# Patient Record
Sex: Female | Born: 1985
Health system: Southern US, Community
[De-identification: ages and names within clinical notes are randomized; demographics above are authoritative.]

## PROBLEM LIST (undated history)

## (undated) DIAGNOSIS — M1990 Unspecified osteoarthritis, unspecified site: Secondary | ICD-10-CM

## (undated) DIAGNOSIS — E119 Type 2 diabetes mellitus without complications: Secondary | ICD-10-CM

## (undated) DIAGNOSIS — R51 Headache: Secondary | ICD-10-CM

## (undated) DIAGNOSIS — I10 Essential (primary) hypertension: Secondary | ICD-10-CM

## (undated) DIAGNOSIS — L732 Hidradenitis suppurativa: Secondary | ICD-10-CM

## (undated) DIAGNOSIS — E039 Hypothyroidism, unspecified: Secondary | ICD-10-CM

## (undated) DIAGNOSIS — R011 Cardiac murmur, unspecified: Secondary | ICD-10-CM

## (undated) DIAGNOSIS — R519 Headache, unspecified: Secondary | ICD-10-CM

## (undated) DIAGNOSIS — J189 Pneumonia, unspecified organism: Secondary | ICD-10-CM

## (undated) DIAGNOSIS — E669 Obesity, unspecified: Secondary | ICD-10-CM

## (undated) DIAGNOSIS — D509 Iron deficiency anemia, unspecified: Secondary | ICD-10-CM

## (undated) DIAGNOSIS — K219 Gastro-esophageal reflux disease without esophagitis: Secondary | ICD-10-CM

## (undated) DIAGNOSIS — E079 Disorder of thyroid, unspecified: Secondary | ICD-10-CM

## (undated) HISTORY — DX: Hidradenitis suppurativa: L73.2

## (undated) HISTORY — DX: Iron deficiency anemia, unspecified: D50.9

## (undated) HISTORY — DX: Disorder of thyroid, unspecified: E07.9

## (undated) HISTORY — DX: Type 2 diabetes mellitus without complications: E11.9

## (undated) HISTORY — PX: WISDOM TOOTH EXTRACTION: SHX21

---
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
## 2007-10-25 ENCOUNTER — Emergency Department (HOSPITAL_COMMUNITY): Admission: EM | Admit: 2007-10-25 | Discharge: 2007-10-25 | Payer: Self-pay | Admitting: Family Medicine

## 2008-04-16 ENCOUNTER — Ambulatory Visit: Payer: Self-pay | Admitting: Nurse Practitioner

## 2008-04-16 DIAGNOSIS — J309 Allergic rhinitis, unspecified: Secondary | ICD-10-CM | POA: Insufficient documentation

## 2008-04-16 DIAGNOSIS — I10 Essential (primary) hypertension: Secondary | ICD-10-CM | POA: Insufficient documentation

## 2008-04-19 ENCOUNTER — Ambulatory Visit: Payer: Self-pay | Admitting: *Deleted

## 2008-05-14 ENCOUNTER — Ambulatory Visit: Payer: Self-pay | Admitting: Nurse Practitioner

## 2008-05-17 ENCOUNTER — Telehealth (INDEPENDENT_AMBULATORY_CARE_PROVIDER_SITE_OTHER): Payer: Self-pay | Admitting: Nurse Practitioner

## 2008-07-07 ENCOUNTER — Ambulatory Visit: Payer: Self-pay | Admitting: Nurse Practitioner

## 2008-07-07 DIAGNOSIS — R3129 Other microscopic hematuria: Secondary | ICD-10-CM | POA: Insufficient documentation

## 2008-07-07 DIAGNOSIS — B009 Herpesviral infection, unspecified: Secondary | ICD-10-CM | POA: Insufficient documentation

## 2008-07-07 DIAGNOSIS — N760 Acute vaginitis: Secondary | ICD-10-CM | POA: Insufficient documentation

## 2008-07-07 LAB — CONVERTED CEMR LAB
Bilirubin Urine: NEGATIVE
Chlamydia, Swab/Urine, PCR: NEGATIVE
Urobilinogen, UA: 0.2
pH: 6

## 2008-07-19 ENCOUNTER — Telehealth (INDEPENDENT_AMBULATORY_CARE_PROVIDER_SITE_OTHER): Payer: Self-pay | Admitting: *Deleted

## 2008-12-07 ENCOUNTER — Encounter (INDEPENDENT_AMBULATORY_CARE_PROVIDER_SITE_OTHER): Payer: Self-pay | Admitting: Nurse Practitioner

## 2009-05-06 ENCOUNTER — Emergency Department (HOSPITAL_COMMUNITY): Admission: EM | Admit: 2009-05-06 | Discharge: 2009-05-06 | Payer: Self-pay | Admitting: Family Medicine

## 2010-03-30 ENCOUNTER — Emergency Department (HOSPITAL_COMMUNITY): Admission: EM | Admit: 2010-03-30 | Discharge: 2010-03-31 | Payer: Self-pay | Admitting: Emergency Medicine

## 2010-09-03 LAB — CONVERTED CEMR LAB
ALT: 22 units/L (ref 0–35)
AST: 16 units/L (ref 0–37)
Alkaline Phosphatase: 57 units/L (ref 39–117)
Basophils Absolute: 0.1 10*3/uL (ref 0.0–0.1)
Basophils Relative: 0 % (ref 0–1)
Bilirubin Urine: NEGATIVE
Blood in Urine, dipstick: NEGATIVE
Eosinophils Absolute: 0.3 10*3/uL (ref 0.0–0.7)
Eosinophils Relative: 2 % (ref 0–5)
Glucose, Urine, Semiquant: NEGATIVE
HCT: 42.5 % (ref 36.0–46.0)
Ketones, urine, test strip: NEGATIVE
LDL Cholesterol: 67 mg/dL (ref 0–99)
Lymphocytes Relative: 30 % (ref 12–46)
Nitrite: NEGATIVE
Pap Smear: NORMAL
Platelets: 407 10*3/uL — ABNORMAL HIGH (ref 150–400)
Protein, U semiquant: NEGATIVE
RDW: 15 % (ref 11.5–15.5)
Sodium: 138 meq/L (ref 135–145)
Specific Gravity, Urine: 1.025
Total Bilirubin: 0.6 mg/dL (ref 0.3–1.2)
Total Protein: 7.8 g/dL (ref 6.0–8.3)
Urobilinogen, UA: 1
VLDL: 19 mg/dL (ref 0–40)
WBC Urine, dipstick: NEGATIVE
pH: 5.5

## 2010-10-20 LAB — URINALYSIS, ROUTINE W REFLEX MICROSCOPIC
Bilirubin Urine: NEGATIVE
Glucose, UA: NEGATIVE mg/dL
Hgb urine dipstick: NEGATIVE
Protein, ur: NEGATIVE mg/dL
Specific Gravity, Urine: 1.018 (ref 1.005–1.030)
Urobilinogen, UA: 1 mg/dL (ref 0.0–1.0)

## 2010-10-20 LAB — BASIC METABOLIC PANEL
CO2: 30 mEq/L (ref 19–32)
Calcium: 9.4 mg/dL (ref 8.4–10.5)
Chloride: 98 mEq/L (ref 96–112)
Glucose, Bld: 122 mg/dL — ABNORMAL HIGH (ref 70–99)
Sodium: 135 mEq/L (ref 135–145)

## 2010-10-20 LAB — DIFFERENTIAL
Eosinophils Relative: 1 % (ref 0–5)
Lymphocytes Relative: 18 % (ref 12–46)
Lymphs Abs: 3.1 10*3/uL (ref 0.7–4.0)

## 2010-10-20 LAB — CBC
Hemoglobin: 13 g/dL (ref 12.0–15.0)
MCH: 24.8 pg — ABNORMAL LOW (ref 26.0–34.0)
MCHC: 31.9 g/dL (ref 30.0–36.0)
MCV: 77.7 fL — ABNORMAL LOW (ref 78.0–100.0)

## 2010-10-20 LAB — D-DIMER, QUANTITATIVE: D-Dimer, Quant: 0.75 ug/mL-FEU — ABNORMAL HIGH (ref 0.00–0.48)

## 2010-10-20 LAB — POCT PREGNANCY, URINE: Preg Test, Ur: NEGATIVE

## 2010-12-05 ENCOUNTER — Inpatient Hospital Stay (INDEPENDENT_AMBULATORY_CARE_PROVIDER_SITE_OTHER)
Admission: RE | Admit: 2010-12-05 | Discharge: 2010-12-05 | Disposition: A | Discharge status: Home / Self Care | Payer: PRIVATE HEALTH INSURANCE | Source: Ambulatory Visit | Attending: Family Medicine | Admitting: Family Medicine

## 2010-12-05 DIAGNOSIS — I10 Essential (primary) hypertension: Secondary | ICD-10-CM

## 2010-12-05 DIAGNOSIS — R51 Headache: Secondary | ICD-10-CM

## 2011-08-09 ENCOUNTER — Emergency Department (HOSPITAL_COMMUNITY): Payer: Self-pay

## 2011-08-09 ENCOUNTER — Encounter: Payer: Self-pay | Admitting: Emergency Medicine

## 2011-08-09 ENCOUNTER — Emergency Department (HOSPITAL_COMMUNITY)
Admission: EM | Admit: 2011-08-09 | Discharge: 2011-08-09 | Disposition: A | Discharge status: Home / Self Care | Payer: Self-pay | Attending: Emergency Medicine | Admitting: Emergency Medicine

## 2011-08-09 DIAGNOSIS — Z79899 Other long term (current) drug therapy: Secondary | ICD-10-CM | POA: Insufficient documentation

## 2011-08-09 DIAGNOSIS — I10 Essential (primary) hypertension: Secondary | ICD-10-CM | POA: Insufficient documentation

## 2011-08-09 DIAGNOSIS — M25571 Pain in right ankle and joints of right foot: Secondary | ICD-10-CM

## 2011-08-09 DIAGNOSIS — M25579 Pain in unspecified ankle and joints of unspecified foot: Secondary | ICD-10-CM | POA: Insufficient documentation

## 2011-08-09 HISTORY — DX: Essential (primary) hypertension: I10

## 2011-08-09 MED ORDER — HYDROCODONE-ACETAMINOPHEN 5-325 MG PO TABS: 2.0000 | ORAL_TABLET | ORAL | Status: DC | PRN

## 2011-08-09 NOTE — ED Provider Notes (Signed)
History     CSN: [id number]  Arrival date & time 08/09/11  0122   First MD Initiated Contact with Patient 08/09/11 0402      Chief Complaint  Patient presents with  . Ankle Pain    since christmas eve    (Consider location/radiation/quality/duration/timing/severity/associated sxs/prior treatment) HPI Complains of right ankle pain onset approximately Christmas Day, 2012.Marland Kitchen No known injury pain is worse with walking or standing or playing pressure improved with rest treat with ibuprofen without adequate pain relief. No known injury no fever no other joint pain no other complaint no other associated symptoms  Past Medical History  Diagnosis Date  . Hypertension     History reviewed. No pertinent past surgical history.  History reviewed. No pertinent family history.  History  Substance Use Topics  . Smoking status: Not on file  . Smokeless tobacco: Not on file  . Alcohol Use: Yes     occassional    OB History    Grav Para Term Preterm Abortions TAB SAB Ect Mult Living                  Review of Systems  Constitutional: Negative.   HENT: Negative.   Respiratory: Negative.   Cardiovascular: Negative.   Gastrointestinal: Negative.   Musculoskeletal: Positive for arthralgias.       Pain at right ankle  Skin: Negative.   Neurological: Negative.   Hematological: Negative.   Psychiatric/Behavioral: Negative.   All other systems reviewed and are negative.    Allergies  Review of patient's allergies indicates no known allergies.  Home Medications   Current Outpatient Rx  Name Route Sig Dispense Refill  . ATENOLOL-CHLORTHALIDONE 100-25 MG PO TABS Oral Take 1 tablet by mouth daily.      Marland Kitchen LISINOPRIL 20 MG PO TABS Oral Take 20 mg by mouth daily.        BP 143/85  Pulse 103  Temp(Src) 97.5 F (36.4 C) (Oral)  Resp 20  SpO2 99%  LMP 08/06/2011  Physical Exam  Nursing note and vitals reviewed. Constitutional: She appears well-developed and well-nourished.    HENT:  Head: Normocephalic and atraumatic.  Eyes: Conjunctivae are normal. Pupils are equal, round, and reactive to light.  Neck: Neck supple. No tracheal deviation present. No thyromegaly present.  Cardiovascular: Intact distal pulses.   Pulmonary/Chest: Effort normal and breath sounds normal.  Abdominal: She exhibits no distension. There is no tenderness.       Morbidly obese  Musculoskeletal: She exhibits no edema.       Left upper upper extremity right upper extremity left lower extremity without redness swelling or tenderness. Right lower extremity without rednessor swelling Minimally tender at lateral malleolus negative Thompson sign neurovascularly intact  Neurological: She is alert. Coordination normal.  Skin: Skin is warm and dry. No rash noted.  Psychiatric: She has a normal mood and affect.    ED Course  Procedures (including critical care time)  Labs Reviewed - No data to display Dg Ankle 2 Views Right  08/09/2011  *RADIOLOGY REPORT*  Clinical Data: Lateral ankle pain.  No known injury.  RIGHT ANKLE - 2 VIEW  Comparison: 10/25/2007  Findings: Mild soft tissue swelling.  No evidence of acute fracture or subluxation.  No focal bone lesion or bone destruction.  No abnormal periosteal reaction.  Bone cortex and trabecular architecture appear intact.  No radiopaque foreign bodies in the soft tissues.  No significant change since previous study.  IMPRESSION: No acute bony abnormalities identified.  Original Report Authenticated By: Marlon Pel, M.D.     No diagnosis found.  Results for orders placed during the hospital encounter of 03/30/10  URINALYSIS, ROUTINE W REFLEX MICROSCOPIC      Component Value Range   Color, Urine YELLOW  YELLOW    APPearance CLEAR  CLEAR    Specific Gravity, Urine 1.018  1.005 - 1.030    pH 5.5  5.0 - 8.0    Glucose, UA NEGATIVE  NEGATIVE (mg/dL)   Hgb urine dipstick NEGATIVE  NEGATIVE    Bilirubin Urine NEGATIVE  NEGATIVE    Ketones, ur  NEGATIVE  NEGATIVE (mg/dL)   Protein, ur NEGATIVE  NEGATIVE (mg/dL)   Urobilinogen, UA 1.0  0.0 - 1.0 (mg/dL)   Nitrite NEGATIVE  NEGATIVE    Leukocytes, UA    NEGATIVE    Value: NEGATIVE MICROSCOPIC NOT DONE ON URINES WITH NEGATIVE PROTEIN, BLOOD, LEUKOCYTES, NITRITE, OR GLUCOSE <1000 mg/dL.  BASIC METABOLIC PANEL      Component Value Range   Sodium 135  135 - 145 (mEq/L)   Potassium 3.5  3.5 - 5.1 (mEq/L)   Chloride 98  96 - 112 (mEq/L)   CO2 30  19 - 32 (mEq/L)   Glucose, Bld 122 (*) 70 - 99 (mg/dL)   BUN 4 (*) 6 - 23 (mg/dL)   Creatinine, Ser 9.60  0.4 - 1.2 (mg/dL)   Calcium 9.4  8.4 - 45.4 (mg/dL)   GFR calc non Af Amer >60  >60 (mL/min)   GFR calc Af Amer    >60 (mL/min)   Value: >60            The eGFR has been calculated     using the MDRD equation.     This calculation has not been     validated in all clinical     situations.     eGFR's persistently     <60 mL/min signify     possible Chronic Kidney Disease.  CBC      Component Value Range   WBC 17.4 (*) 4.0 - 10.5 (K/uL)   RBC 5.25 (*) 3.87 - 5.11 (MIL/uL)   Hemoglobin 13.0  12.0 - 15.0 (g/dL)   HCT 09.8  11.9 - 14.7 (%)   MCV 77.7 (*) 78.0 - 100.0 (fL)   MCH 24.8 (*) 26.0 - 34.0 (pg)   MCHC 31.9  30.0 - 36.0 (g/dL)   RDW 82.9  56.2 - 13.0 (%)   Platelets 413 (*) 150 - 400 (K/uL)  DIFFERENTIAL      Component Value Range   Neutrophils Relative 76  43 - 77 (%)   Neutro Abs 13.2 (*) 1.7 - 7.7 (K/uL)   Lymphocytes Relative 18  12 - 46 (%)   Lymphs Abs 3.1  0.7 - 4.0 (K/uL)   Monocytes Relative 5  3 - 12 (%)   Monocytes Absolute 0.9  0.1 - 1.0 (K/uL)   Eosinophils Relative 1  0 - 5 (%)   Eosinophils Absolute 0.2  0.0 - 0.7 (K/uL)   Basophils Relative 0  0 - 1 (%)   Basophils Absolute 0.0  0.0 - 0.1 (K/uL)  D-DIMER, QUANTITATIVE      Component Value Range   D-Dimer, Quant   (*) 0.00 - 0.48 (ug/mL-FEU)   Value: 0.75            AT THE INHOUSE ESTABLISHED CUTOFF     VALUE OF 0.48 ug/mL FEU,     THIS ASSAY  HAS BEEN DOCUMENTED     IN THE LITERATURE TO HAVE     A SENSITIVITY AND NEGATIVE     PREDICTIVE VALUE OF AT LEAST     98 TO 99%.  THE TEST RESULT     SHOULD BE CORRELATED WITH     AN ASSESSMENT OF THE CLINICAL     PROBABILITY OF DVT / VTE.  POCT PREGNANCY, URINE      Component Value Range   Preg Test, Ur       Value: NEGATIVE            THE SENSITIVITY OF THIS     METHODOLOGY IS >24 mIU/mL   Dg Ankle 2 Views Right  08/09/2011  *RADIOLOGY REPORT*  Clinical Data: Lateral ankle pain.  No known injury.  RIGHT ANKLE - 2 VIEW  Comparison: 10/25/2007  Findings: Mild soft tissue swelling.  No evidence of acute fracture or subluxation.  No focal bone lesion or bone destruction.  No abnormal periosteal reaction.  Bone cortex and trabecular architecture appear intact.  No radiopaque foreign bodies in the soft tissues.  No significant change since previous study.  IMPRESSION: No acute bony abnormalities identified.  Original Report Authenticated By: Marlon Pel, M.D.     MDM  No signs of infection or inflammation no signs of injury no history of injury Plan ASO splint crutches prescription hydrocodone-A. Pap orthopedic referral as needed Diagnosis pain in right ankle        Doug Sou, MD 08/09/11 [phone]

## 2011-08-09 NOTE — ED Notes (Signed)
Pt choose to ambulate out to pmv. Rated pain 8 . Gait steady with splint and ortho shoe on with use of wall and rails to steady self as pt refused crutches.

## 2011-08-13 ENCOUNTER — Emergency Department (HOSPITAL_COMMUNITY)
Admission: EM | Admit: 2011-08-13 | Discharge: 2011-08-14 | Disposition: A | Discharge status: Home / Self Care | Payer: Self-pay | Attending: Emergency Medicine | Admitting: Emergency Medicine

## 2011-08-13 ENCOUNTER — Encounter (HOSPITAL_COMMUNITY): Payer: Self-pay | Admitting: Emergency Medicine

## 2011-08-13 ENCOUNTER — Emergency Department (HOSPITAL_COMMUNITY): Payer: Self-pay

## 2011-08-13 DIAGNOSIS — M25473 Effusion, unspecified ankle: Secondary | ICD-10-CM | POA: Insufficient documentation

## 2011-08-13 DIAGNOSIS — Z79899 Other long term (current) drug therapy: Secondary | ICD-10-CM | POA: Insufficient documentation

## 2011-08-13 DIAGNOSIS — S93409A Sprain of unspecified ligament of unspecified ankle, initial encounter: Secondary | ICD-10-CM | POA: Insufficient documentation

## 2011-08-13 DIAGNOSIS — M25579 Pain in unspecified ankle and joints of unspecified foot: Secondary | ICD-10-CM | POA: Insufficient documentation

## 2011-08-13 DIAGNOSIS — S93401A Sprain of unspecified ligament of right ankle, initial encounter: Secondary | ICD-10-CM

## 2011-08-13 DIAGNOSIS — T733XXA Exhaustion due to excessive exertion, initial encounter: Secondary | ICD-10-CM | POA: Insufficient documentation

## 2011-08-13 DIAGNOSIS — M25476 Effusion, unspecified foot: Secondary | ICD-10-CM | POA: Insufficient documentation

## 2011-08-13 DIAGNOSIS — I10 Essential (primary) hypertension: Secondary | ICD-10-CM | POA: Insufficient documentation

## 2011-08-13 MED ORDER — KETOROLAC TROMETHAMINE 60 MG/2ML IM SOLN
60.0000 mg | Freq: Once | INTRAMUSCULAR | Status: AC
  Administered 2011-08-13: 60 mg via INTRAMUSCULAR
  Filled 2011-08-13: qty 2

## 2011-08-13 NOTE — ED Provider Notes (Signed)
History     CSN: [id number]  Arrival date & time 08/13/11  2115   First MD Initiated Contact with Patient 08/13/11 2256      Chief Complaint  Patient presents with  . Ankle Pain    R ankle seen 4 days ago [facility] for same    (Consider location/radiation/quality/duration/timing/severity/associated sxs/prior treatment) HPI Comments: 26 year old female with history of right ankle injury approximately 5 years ago, felt recurrent pain last week while standing on the foot for 5 hours. Pain has been constant, mild, 4-5 in intensity, associated with mild swelling. Pain is located over the lateral malleolus and Achilles tendon. Pain is worsened by ambulation. No redness or fevers noted she was seen in the emergency department and treated with hydrocodone and ASO ankle splint with no relief.  Patient is a 26 y.o. female presenting with ankle pain. The history is provided by the patient, a relative and medical records.  Ankle Pain  Pertinent negatives include no numbness.    Past Medical History  Diagnosis Date  . Hypertension     History reviewed. No pertinent past surgical history.  History reviewed. No pertinent family history.  History  Substance Use Topics  . Smoking status: Never Smoker   . Smokeless tobacco: Not on file  . Alcohol Use: Yes     occassional    OB History    Grav Para Term Preterm Abortions TAB SAB Ect Mult Living                  Review of Systems  Gastrointestinal: Negative for nausea and vomiting.  Musculoskeletal: Positive for joint swelling.  Neurological: Negative for weakness and numbness.    Allergies  Review of patient's allergies indicates no known allergies.  Home Medications   Current Outpatient Rx  Name Route Sig Dispense Refill  . ATENOLOL-CHLORTHALIDONE 100-25 MG PO TABS Oral Take 1 tablet by mouth daily.      Marland Kitchen HYDROCODONE-ACETAMINOPHEN 5-325 MG PO TABS Oral Take 2 tablets by mouth every 4 (four) hours as needed. For pain     .  LISINOPRIL 20 MG PO TABS Oral Take 20 mg by mouth daily.      Marland Kitchen NAPROXEN 500 MG PO TABS Oral Take 1 tablet (500 mg total) by mouth 2 (two) times daily with a meal. 30 tablet 0    BP 140/79  Pulse 83  Temp(Src) 98.1 F (36.7 C) (Oral)  Resp 20  SpO2 99%  LMP 08/06/2011  Physical Exam  Nursing note and vitals reviewed. Constitutional: She appears well-developed and well-nourished. No distress.  HENT:  Head: Normocephalic and atraumatic.  Eyes: Conjunctivae are normal. No scleral icterus.  Cardiovascular: Normal rate, regular rhythm and intact distal pulses.   Pulmonary/Chest: Effort normal and breath sounds normal.  Musculoskeletal: She exhibits tenderness ( Mild tenderness to palpation over the lateral malleolus, tendons, Achilles tendon. No medial tenderness, no tenderness over the base of the fifth metatarsal.). She exhibits no edema.       Good range of motion of ankle without redness or obvious swelling. Patient is morbidly obese, has supple joints including ankle though notes 5-6/10 pain with range of motion  Neurological: She is alert.  Skin: Skin is warm and dry. No rash noted. She is not diaphoretic.    ED Course  Procedures (including critical care time)  Labs Reviewed - No data to display Dg Ankle Complete Right  08/14/2011  *RADIOLOGY REPORT*  Clinical Data: Foot pain.  Lateral ankle pain and swelling.  RIGHT ANKLE - COMPLETE 3+ VIEW  Comparison: None.  Findings: Ankle mortise appears congruent.  The talar dome is intact.  Lateral malleolar soft tissue swelling.  No ankle fracture is identified.  Anatomic alignment.  Ankle effusion is evident on the lateral view.  IMPRESSION: No acute osseous abnormality.  Lateral malleolar soft tissue swelling.  Original Report Authenticated By: Andreas Newport, M.D.     1. Sprain of right ankle       MDM  Ankle injury, repeat x-ray to rule out occult fracture, NSAID, Toradol intramuscular, chem walker, ice and elevation.  X-ray is  negative, no fracture seen by myself for radiologist.  Discharge prescription,  Naprosyn 500 mg by mouth twice a day        Vida Roller, MD 08/14/11 0045

## 2011-08-13 NOTE — ED Notes (Signed)
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 Seen at [facility] ED several days ago and states ankle splint causes increased pain with ambulation. Norco 1 tab ineffective for pain control

## 2011-08-13 NOTE — ED Notes (Signed)
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 After that she said the she could not bear weight. She came to the ED and they gave her brace and pain medication. According to the paiten, there was no sprain or break. She stated that it has not be getting better since then. She has had increased swelling and pain. She stated that the MD recommended going to an orthopedic, but she did not have any information. There is currently no swelling. Pain is at 0 when she is sitting. When she stands, it throbs and pain is 9 out of 10.

## 2011-08-14 MED ORDER — NAPROXEN 500 MG PO TABS: 500.0000 mg | ORAL_TABLET | Freq: Two times a day (BID) | ORAL | Status: DC

## 2011-11-27 ENCOUNTER — Encounter (HOSPITAL_COMMUNITY): Payer: Self-pay | Admitting: *Deleted

## 2011-11-27 ENCOUNTER — Emergency Department (INDEPENDENT_AMBULATORY_CARE_PROVIDER_SITE_OTHER)
Admission: EM | Admit: 2011-11-27 | Discharge: 2011-11-27 | Disposition: A | Discharge status: Home / Self Care | Payer: Self-pay | Source: Home / Self Care | Attending: Family Medicine | Admitting: Family Medicine

## 2011-11-27 DIAGNOSIS — K529 Noninfective gastroenteritis and colitis, unspecified: Secondary | ICD-10-CM

## 2011-11-27 DIAGNOSIS — K5289 Other specified noninfective gastroenteritis and colitis: Secondary | ICD-10-CM

## 2011-11-27 HISTORY — DX: Unspecified osteoarthritis, unspecified site: M19.90

## 2011-11-27 MED ORDER — IBUPROFEN 600 MG PO TABS: 600.0000 mg | ORAL_TABLET | Freq: Three times a day (TID) | ORAL | Status: DC

## 2011-11-27 MED ORDER — HYDROCODONE-ACETAMINOPHEN 5-500 MG PO TABS: 1.0000 | ORAL_TABLET | Freq: Four times a day (QID) | ORAL | Status: AC | PRN

## 2011-11-27 MED ORDER — LOPERAMIDE HCL 2 MG PO CAPS: 2.0000 mg | ORAL_CAPSULE | Freq: Four times a day (QID) | ORAL | Status: DC | PRN

## 2011-11-27 MED ORDER — LOPERAMIDE HCL 2 MG PO CAPS: 2.0000 mg | ORAL_CAPSULE | Freq: Four times a day (QID) | ORAL | Status: AC | PRN

## 2011-11-27 MED ORDER — ONDANSETRON 4 MG PO TBDP: 4.0000 mg | ORAL_TABLET | Freq: Three times a day (TID) | ORAL | Status: AC | PRN

## 2011-11-27 MED ORDER — IBUPROFEN 600 MG PO TABS: 600.0000 mg | ORAL_TABLET | Freq: Three times a day (TID) | ORAL | Status: AC

## 2011-11-27 MED ORDER — ONDANSETRON 4 MG PO TBDP: 4.0000 mg | ORAL_TABLET | Freq: Three times a day (TID) | ORAL | Status: DC | PRN

## 2011-11-27 NOTE — Discharge Instructions (Signed)
Is likely you have a viral infection. Is very important top keep well hydrated. Take the prescribed medications as instructed. Can take ibuprofen scheduled every 8 hours for the next 24-48 hours take with food and plenty of liquids as it can upset your stomach, can alternate with prescribed Vicodin for fever or pain. Be. aware that Vicodin can make you drowsy and he should not drive or operate machinery after taking this medication. Use nasal saline spray at least 3 times a day. (simply saline is over the counter) Return if difficulty breathing, chest pain worsening symptoms or not keeping fluids down.

## 2011-11-27 NOTE — ED Notes (Signed)
C/o vomiting, diarrhea, fever. States works on [address] at [employer] and was exposed to pneumonia pt last week and states sore throat, congestion after exposure. Then yesterday ate at [employer] and started V/D at 0300 this a.m. With fever States has only taken ginger ale and OJ for symptoms.

## 2011-11-30 NOTE — ED Provider Notes (Signed)
History     CSN: [id number]  Arrival date & time 11/27/11  1643   First MD Initiated Contact with Patient 11/27/11 1734      Chief Complaint  Patient presents with  . Emesis    (Consider location/radiation/quality/duration/timing/severity/associated sxs/prior treatment) HPI Comments: 26 y/o female h/o htn. Here c/o Nasal congestion and sore throat with no fever for more than 3 days this morning her symptoms also associated with nausea, vomiting, diarrhea and fever.  Able to keep fluids but has not been able to eat solids since chinese food last night for dinner. Also concerned as she works as Psychologist, sport and exercise in the hospital and has been exposed to a patient with pneumonia. Denies chest pain, productive cough or difficulty breathing. No medications for her symptoms today. Temp is 100.6   Past Medical History  Diagnosis Date  . Hypertension   . Arthritis     History reviewed. No pertinent past surgical history.  Family History  Problem Relation Age of Onset  . Stroke Other   . Heart failure Other     History  Substance Use Topics  . Smoking status: Never Smoker   . Smokeless tobacco: Not on file  . Alcohol Use: Yes     occassional    OB History    Grav Para Term Preterm Abortions TAB SAB Ect Mult Living                  Review of Systems  Constitutional: Positive for appetite change. Negative for chills and diaphoresis.  HENT: Positive for congestion, sore throat, rhinorrhea and sneezing. Negative for trouble swallowing, neck pain and neck stiffness.   Eyes: Negative for discharge.  Respiratory: Positive for cough. Negative for chest tightness, shortness of breath and wheezing.   Cardiovascular: Negative for chest pain and leg swelling.  Gastrointestinal: Positive for nausea, vomiting and diarrhea. Negative for abdominal pain.  Skin: Negative for rash.    Allergies  Review of patient's allergies indicates no known allergies.  Home Medications   Current Outpatient  Rx  Name Route Sig Dispense Refill  . ATENOLOL-CHLORTHALIDONE 100-25 MG PO TABS Oral Take 1 tablet by mouth daily.      Marland Kitchen HYDROCODONE-ACETAMINOPHEN 5-500 MG PO TABS Oral Take 1 tablet by mouth every 6 (six) hours as needed for pain. 30 tablet 0  . IBUPROFEN 600 MG PO TABS Oral Take 1 tablet (600 mg total) by mouth 3 (three) times daily. 20 tablet 0  . LISINOPRIL 20 MG PO TABS Oral Take 20 mg by mouth daily.      Marland Kitchen LOPERAMIDE HCL 2 MG PO CAPS Oral Take 1 capsule (2 mg total) by mouth 4 (four) times daily as needed for diarrhea or loose stools. 12 capsule 0  . ONDANSETRON 4 MG PO TBDP Oral Take 1 tablet (4 mg total) by mouth every 8 (eight) hours as needed for nausea. 10 tablet 0    BP 158/95  Pulse 110  Temp(Src) 100.6 F (38.1 C) (Oral)  Resp 22  SpO2 100%  LMP 11/13/2011  Physical Exam  Nursing note and vitals reviewed. Constitutional: She is oriented to person, place, and time. She appears well-developed and well-nourished. No distress.       obese  HENT:       Nasal Congestion with erythema and swelling of nasal turbinates, clear rhinorrhea. mild pharyngeal erythema no exudates. No uvula deviation. No trismus.  TM's normal.   Eyes: Conjunctivae are normal. Pupils are equal, round, and reactive to  light. No scleral icterus.  Neck: Neck supple. No JVD present. No thyromegaly present.  Cardiovascular: Normal rate, regular rhythm and normal heart sounds.   Pulmonary/Chest: Effort normal and breath sounds normal. No respiratory distress. She has no wheezes. She has no rales.  Abdominal: Soft. Bowel sounds are normal. She exhibits no distension and no mass. There is no tenderness. There is no rebound and no guarding.  Lymphadenopathy:    She has no cervical adenopathy.  Neurological: She is alert and oriented to person, place, and time.  Skin: No rash noted.    ED Course  Procedures (including critical care time)   Labs Reviewed  POCT RAPID STREP A ([facility] ONLY)  LAB  REPORT - SCANNED   No results found.   1. Gastroenteritis       MDM  Negative strept. Impress viral gastroenteritis. Treated symptomatically. Red flags that should prompt her return discussed.         Sharin Grave, MD 11/30/11 2123

## 2013-03-02 ENCOUNTER — Emergency Department (INDEPENDENT_AMBULATORY_CARE_PROVIDER_SITE_OTHER): Payer: 59

## 2013-03-02 ENCOUNTER — Encounter (HOSPITAL_COMMUNITY): Payer: Self-pay | Admitting: *Deleted

## 2013-03-02 ENCOUNTER — Emergency Department (HOSPITAL_COMMUNITY)
Admission: EM | Admit: 2013-03-02 | Discharge: 2013-03-02 | Disposition: A | Discharge status: Home / Self Care | Payer: 59 | Source: Home / Self Care

## 2013-03-02 DIAGNOSIS — E669 Obesity, unspecified: Secondary | ICD-10-CM

## 2013-03-02 DIAGNOSIS — M25562 Pain in left knee: Secondary | ICD-10-CM

## 2013-03-02 DIAGNOSIS — M25569 Pain in unspecified knee: Secondary | ICD-10-CM

## 2013-03-02 MED ORDER — MELOXICAM 15 MG PO TABS: 15.0000 mg | ORAL_TABLET | Freq: Every day | ORAL | Status: DC | PRN

## 2013-03-02 MED ORDER — TRAMADOL HCL 50 MG PO TABS: 50.0000 mg | ORAL_TABLET | Freq: Four times a day (QID) | ORAL | Status: DC | PRN

## 2013-03-02 NOTE — ED Notes (Signed)
Pt reports right knee pain with onset at work of [employer].  Pt states knee "buckled" while walking.

## 2013-03-02 NOTE — ED Provider Notes (Signed)
Lorraine Chambers is a 27 y.o. female who presents to Urgent Care today for left anterior knee pain. Patient was standing at work on Saturday when her knee suddenly gave way. She noted significant anterior knee pain. She denies any twisting or fall. She was standing still in her knee suddenly gave way. She is significant anterior knee pain and difficulty walking and bearing weight. She has tried several over-the-counter pain medications which have helped a bit. She denies any significant prior history. No radiating pain weakness or numbness.    PMH reviewed. Morbidly obese, hypertension History  Substance Use Topics  . Smoking status: Never Smoker   . Smokeless tobacco: Not on file  . Alcohol Use: Yes     Comment: occassional   ROS as above Medications reviewed. No current facility-administered medications for this encounter.   Current Outpatient Prescriptions  Medication Sig Dispense Refill  . atenolol-chlorthalidone (TENORETIC) 100-25 MG per tablet Take 1 tablet by mouth daily.        Marland Kitchen lisinopril (PRINIVIL,ZESTRIL) 20 MG tablet Take 20 mg by mouth daily.       . meloxicam (MOBIC) 15 MG tablet Take 1 tablet (15 mg total) by mouth daily as needed for pain.  30 tablet  0  . traMADol (ULTRAM) 50 MG tablet Take 1 tablet (50 mg total) by mouth every 6 (six) hours as needed for pain.  30 tablet  0    Exam:  BP 140/93  Pulse 98  Temp(Src) 97.8 F (36.6 C) (Oral)  SpO2 94%  LMP 01/04/2013 Gen: Well NAD, morbidly obese HEENT: EOMI,  MMM Lungs: CTABL Nl WOB Heart: RRR no MRG Abd: NABS, NT, ND Exts: Non edematous BL  LE, warm and well perfused.  Left knee: Morbidly obese leg but difficult to ascertain anatomical landmarks. Tender to palpation at the inferior patella pole. Pain with patellar motion.  Range of motion 0-90 Negative McMurray's test.  Capillary refill sensation are intact distally.   No results found for this or any previous visit (from the past 24 hour(s)). Dg Knee 2  Views Left  03/02/2013   *RADIOLOGY REPORT*  Clinical Data: Knee pain  LEFT KNEE - 1-2 VIEW  Comparison: None.  Findings: Suboptimal AP positioning.  Mild tricompartmental degenerative change noted.  No fracture or dislocation.  No suprapatellar effusion.  No radiopaque foreign body.  IMPRESSION: No acute osseous abnormality of the left knee.   Original Report Authenticated By: Christiana Pellant, M.D.    Assessment and Plan: 27 y.o. female with left anterior knee pain. Likely patellofemoral chondromalacia related. Possible meniscus injury. Difficult to ascertain due to body habitus.  Plan: Crutches and meloxicam as needed as well as tramadol.  Followup [facility]ics [facility] medicine [facility] in a few days.  Out of work until followup.   Advised patient to lose weight     Rodolph Bong, MD 03/02/13 [phone]

## 2013-09-09 ENCOUNTER — Emergency Department (HOSPITAL_COMMUNITY)
Admission: EM | Admit: 2013-09-09 | Discharge: 2013-09-09 | Disposition: A | Discharge status: Home / Self Care | Payer: 59 | Source: Home / Self Care

## 2013-09-09 ENCOUNTER — Ambulatory Visit (INDEPENDENT_AMBULATORY_CARE_PROVIDER_SITE_OTHER): Payer: 59 | Admitting: Emergency Medicine

## 2013-09-09 VITALS — BP 116/80 | HR 74 | Temp 97.7°F | Resp 16 | Ht 60.0 in | Wt 345.0 lb

## 2013-09-09 DIAGNOSIS — R300 Dysuria: Secondary | ICD-10-CM

## 2013-09-09 DIAGNOSIS — N3000 Acute cystitis without hematuria: Secondary | ICD-10-CM

## 2013-09-09 LAB — POCT UA - MICROSCOPIC ONLY
BACTERIA, U MICROSCOPIC: NEGATIVE
Casts, Ur, LPF, POC: NEGATIVE
Crystals, Ur, HPF, POC: NEGATIVE
MUCUS UA: NEGATIVE
YEAST UA: NEGATIVE

## 2013-09-09 LAB — POCT URINALYSIS DIPSTICK
BILIRUBIN UA: NEGATIVE
GLUCOSE UA: NEGATIVE
Ketones, UA: NEGATIVE
Nitrite, UA: NEGATIVE
PH UA: 5.5
Protein, UA: 30
RBC UA: NEGATIVE
Urobilinogen, UA: 0.2

## 2013-09-09 MED ORDER — CIPROFLOXACIN HCL 500 MG PO TABS: 500.0000 mg | ORAL_TABLET | Freq: Two times a day (BID) | ORAL | Status: DC

## 2013-09-09 MED ORDER — PHENAZOPYRIDINE HCL 200 MG PO TABS: 200.0000 mg | ORAL_TABLET | Freq: Three times a day (TID) | ORAL | Status: DC | PRN

## 2013-09-09 NOTE — Progress Notes (Signed)
[facility] [address] [place] [address] [phone]  Date:  09/09/2013   Name:  Lorraine Chambers   DOB:  01-May-1986   MRN:  [id number]  PCP:  Provider Not In System    Chief Complaint: Dysuria   History of Present Illness:  Lorraine Chambers is a 28 y.o. very pleasant female patient who presents with the following:  Dysuria, urgency and frequent urination.  Last night voided 20-25 times at work.  No fever or chills, no GYN or GI symptoms.  No abdominal or back pain.  No improvement with over the counter medications or other home remedies. Denies other complaint or health concern today.   Patient Active Problem List   Diagnosis Date Noted  . HERPES SIMPLEX INFECTION, TYPE I 07/07/2008  . MICROSCOPIC HEMATURIA 07/07/2008  . VAGINITIS, BACTERIAL 07/07/2008  . OBESITY 04/16/2008  . HYPERTENSION, BENIGN ESSENTIAL 04/16/2008  . ALLERGIC RHINITIS 04/16/2008    Past Medical History  Diagnosis Date  . Arthritis   . Hypertension     Pt reports episode of HTN in 2009    History reviewed. No pertinent past surgical history.  History  Substance Use Topics  . Smoking status: Never Smoker   . Smokeless tobacco: Not on file  . Alcohol Use: Yes     Comment: occassional    Family History  Problem Relation Age of Onset  . Stroke Other   . Heart failure Other     No Known Allergies  Medication list has been reviewed and updated.  Current Outpatient Prescriptions on File Prior to Visit  Medication Sig Dispense Refill  . atenolol-chlorthalidone (TENORETIC) 100-25 MG per tablet Take 1 tablet by mouth daily.        Marland Kitchen lisinopril (PRINIVIL,ZESTRIL) 20 MG tablet Take 20 mg by mouth daily.       . meloxicam (MOBIC) 15 MG tablet Take 1 tablet (15 mg total) by mouth daily as needed for pain.  30 tablet  0  . traMADol (ULTRAM) 50 MG tablet Take 1 tablet (50 mg total) by mouth every 6 (six) hours as needed for pain.  30 tablet  0   No current  facility-administered medications on file prior to visit.    Review of Systems:  As per HPI, otherwise negative.    Physical Examination: Filed Vitals:   09/09/13 1346  BP: 116/80  Pulse: 74  Temp: 97.7 F (36.5 C)  Resp: 16   Filed Vitals:   09/09/13 1346  Height: 5' (1.524 m)  Weight: 345 lb (156.491 kg)   Body mass index is 67.38 kg/(m^2). Ideal Body Weight: Weight in (lb) to have BMI = 25: 127.7   GEN: WDWN, NAD, Non-toxic, Alert & Oriented x 3 HEENT: Atraumatic, Normocephalic.  Ears and Nose: No external deformity. EXTR: No clubbing/cyanosis/edema NEURO: Normal gait.  PSYCH: Normally interactive. Conversant. Not depressed or anxious appearing.  Calm demeanor.    Assessment and Plan: Urinary frequency Cystitis  Signed,  Ellison Carwin, MD   Results for orders placed in visit on 09/09/13  POCT URINALYSIS DIPSTICK      Result Value Range   Color, UA yellow     Clarity, UA clear     Glucose, UA neg     Bilirubin, UA neg     Ketones, UA neg     Spec Grav, UA >=1.030     Blood, UA neg     pH, UA 5.5     Protein, UA  30     Urobilinogen, UA 0.2     Nitrite, UA neg     Leukocytes, UA Trace    POCT UA - MICROSCOPIC ONLY      Result Value Range   WBC, Ur, HPF, POC 0-2     RBC, urine, microscopic 0-1     Bacteria, U Microscopic neg     Mucus, UA neg     Epithelial cells, urine per micros 0-5     Crystals, Ur, HPF, POC neg     Casts, Ur, LPF, POC neg     Yeast, UA neg

## 2013-09-09 NOTE — Patient Instructions (Signed)
Urinary Tract Infection  Urinary tract infections (UTIs) can develop anywhere along your urinary tract. Your urinary tract is your body's drainage system for removing wastes and extra water. Your urinary tract includes two kidneys, two ureters, a bladder, and a urethra. Your kidneys are a pair of bean-shaped organs. Each kidney is about the size of your fist. They are located below your ribs, one on each side of your spine.  CAUSES  Infections are caused by microbes, which are microscopic organisms, including fungi, viruses, and bacteria. These organisms are so small that they can only be seen through a microscope. Bacteria are the microbes that most commonly cause UTIs.  SYMPTOMS   Symptoms of UTIs may vary by age and gender of the patient and by the location of the infection. Symptoms in young women typically include a frequent and intense urge to urinate and a painful, burning feeling in the bladder or urethra during urination. Older women and men are more likely to be tired, shaky, and weak and have muscle aches and abdominal pain. A fever may mean the infection is in your kidneys. Other symptoms of a kidney infection include pain in your back or sides below the ribs, nausea, and vomiting.  DIAGNOSIS  To diagnose a UTI, your caregiver will ask you about your symptoms. Your caregiver also will ask to provide a urine sample. The urine sample will be tested for bacteria and white blood cells. White blood cells are made by your body to help fight infection.  TREATMENT   Typically, UTIs can be treated with medication. Because most UTIs are caused by a bacterial infection, they usually can be treated with the use of antibiotics. The choice of antibiotic and length of treatment depend on your symptoms and the type of bacteria causing your infection.  HOME CARE INSTRUCTIONS   If you were prescribed antibiotics, take them exactly as your caregiver instructs you. Finish the medication even if you feel better after you  have only taken some of the medication.   Drink enough water and fluids to keep your urine clear or pale yellow.   Avoid caffeine, tea, and carbonated beverages. They tend to irritate your bladder.   Empty your bladder often. Avoid holding urine for long periods of time.   Empty your bladder before and after sexual intercourse.   After a bowel movement, women should cleanse from front to back. Use each tissue only once.  SEEK MEDICAL CARE IF:    You have back pain.   You develop a fever.   Your symptoms do not begin to resolve within 3 days.  SEEK IMMEDIATE MEDICAL CARE IF:    You have severe back pain or lower abdominal pain.   You develop chills.   You have nausea or vomiting.   You have continued burning or discomfort with urination.  MAKE SURE YOU:    Understand these instructions.   Will watch your condition.   Will get help right away if you are not doing well or get worse.  Document Released: 05/02/2005 Document Revised: 01/22/2012 Document Reviewed: 08/31/2011  [employer] Patient Information 2014 [employer].

## 2013-09-11 LAB — URINE CULTURE

## 2013-09-19 ENCOUNTER — Other Ambulatory Visit: Payer: Self-pay | Admitting: Family Medicine

## 2013-09-19 ENCOUNTER — Ambulatory Visit (INDEPENDENT_AMBULATORY_CARE_PROVIDER_SITE_OTHER): Payer: 59 | Admitting: Family Medicine

## 2013-09-19 VITALS — BP 138/92 | HR 90 | Temp 98.1°F | Resp 18 | Ht 60.0 in | Wt 343.0 lb

## 2013-09-19 DIAGNOSIS — N899 Noninflammatory disorder of vagina, unspecified: Secondary | ICD-10-CM

## 2013-09-19 DIAGNOSIS — N898 Other specified noninflammatory disorders of vagina: Secondary | ICD-10-CM

## 2013-09-19 DIAGNOSIS — N760 Acute vaginitis: Secondary | ICD-10-CM

## 2013-09-19 DIAGNOSIS — Z1159 Encounter for screening for other viral diseases: Secondary | ICD-10-CM

## 2013-09-19 DIAGNOSIS — B9689 Other specified bacterial agents as the cause of diseases classified elsewhere: Secondary | ICD-10-CM

## 2013-09-19 DIAGNOSIS — R768 Other specified abnormal immunological findings in serum: Secondary | ICD-10-CM

## 2013-09-19 DIAGNOSIS — Z113 Encounter for screening for infections with a predominantly sexual mode of transmission: Secondary | ICD-10-CM

## 2013-09-19 DIAGNOSIS — R300 Dysuria: Secondary | ICD-10-CM

## 2013-09-19 DIAGNOSIS — Z789 Other specified health status: Secondary | ICD-10-CM

## 2013-09-19 LAB — POCT URINALYSIS DIPSTICK
Bilirubin, UA: NEGATIVE
Blood, UA: NEGATIVE
Glucose, UA: NEGATIVE
Ketones, UA: NEGATIVE
Leukocytes, UA: NEGATIVE
Nitrite, UA: NEGATIVE
Spec Grav, UA: 1.02
Urobilinogen, UA: 2
pH, UA: 8.5

## 2013-09-19 LAB — POCT UA - MICROSCOPIC ONLY
Casts, Ur, LPF, POC: NEGATIVE
Crystals, Ur, HPF, POC: NEGATIVE
Mucus, UA: NEGATIVE
RBC, urine, microscopic: NEGATIVE
WBC, Ur, HPF, POC: NEGATIVE
Yeast, UA: NEGATIVE

## 2013-09-19 LAB — POCT WET PREP WITH KOH
KOH Prep POC: NEGATIVE
Trichomonas, UA: NEGATIVE
WBC WET PREP PER HPF POC: NEGATIVE
Yeast Wet Prep HPF POC: NEGATIVE

## 2013-09-19 LAB — POCT URINE PREGNANCY: PREG TEST UR: NEGATIVE

## 2013-09-19 LAB — HEPATITIS C ANTIBODY: HCV Ab: REACTIVE — AB

## 2013-09-19 LAB — HEPATITIS B SURFACE ANTIBODY, QUANTITATIVE: HEPATITIS B-POST: 669 m[IU]/mL

## 2013-09-19 LAB — HIV ANTIBODY (ROUTINE TESTING W REFLEX): HIV: NONREACTIVE

## 2013-09-19 LAB — HEPATITIS B SURFACE ANTIGEN: HEP B S AG: NEGATIVE

## 2013-09-19 MED ORDER — FLUCONAZOLE 150 MG PO TABS: 150.0000 mg | ORAL_TABLET | Freq: Once | ORAL | Status: DC

## 2013-09-19 MED ORDER — METRONIDAZOLE 500 MG PO TABS: 500.0000 mg | ORAL_TABLET | Freq: Two times a day (BID) | ORAL | Status: DC

## 2013-09-19 NOTE — Progress Notes (Addendum)
[facility] [address] [place] [address] [phone]  Date:  09/19/2013   Name:  Lorraine Chambers   DOB:  July 30, 1986   MRN:  [id number]  PCP:  Provider Not In System    Chief Complaint: Urinary Tract Infection   History of Present Illness:  Lorraine Chambers is a 28 y.o. very pleasant female patient who presents with the following:  Here today to recheck a possible UTI.  She was here on 2/4 with the following HPI:  Dysuria, urgency and frequent urination. Last night voided 20-25 times at work. No fever or chills, no GYN or GI symptoms. No abdominal or back pain. No improvement with over the counter medications or other home remedies. Denies other complaint or health concern today.   She was treated with cipro, but her urine culture came back with insignificant growth.   Since her last visit, her sx did seem to resolve.  However over the last few days she has noted "it's very, very irritated."  She will have some burning and stinging with urination.  Also has noted discharge- it seemed kind of thick and white.  She does have some vaginal itching off and on.   She does still have frequent urination.  No blood in her urine that she has noted.   No fever, chills, back pain, no nausea or vomiting.   She has a paraguard IUD and does get menses some of the time.   She would like to have BW for STI screening today as well  Patient Active Problem List   Diagnosis Date Noted  . HERPES SIMPLEX INFECTION, TYPE I 07/07/2008  . MICROSCOPIC HEMATURIA 07/07/2008  . VAGINITIS, BACTERIAL 07/07/2008  . OBESITY 04/16/2008  . HYPERTENSION, BENIGN ESSENTIAL 04/16/2008  . ALLERGIC RHINITIS 04/16/2008    Past Medical History  Diagnosis Date  . Arthritis   . Hypertension     Pt reports episode of HTN in 2009    History reviewed. No pertinent past surgical history.  History  Substance Use Topics  . Smoking status: Never Smoker   . Smokeless tobacco: Not on file  .  Alcohol Use: Yes     Comment: occassional    Family History  Problem Relation Age of Onset  . Stroke Other   . Heart failure Other   . Heart disease Mother   . Diabetes Mother   . Hypertension Mother     No Known Allergies  Medication list has been reviewed and updated.  Current Outpatient Prescriptions on File Prior to Visit  Medication Sig Dispense Refill  . ciprofloxacin (CIPRO) 500 MG tablet Take 1 tablet (500 mg total) by mouth 2 (two) times daily.  10 tablet  0  . phenazopyridine (PYRIDIUM) 200 MG tablet Take 1 tablet (200 mg total) by mouth 3 (three) times daily as needed for pain.  6 tablet  0   No current facility-administered medications on file prior to visit.    Review of Systems:  As per HPI- otherwise negative.   Physical Examination: Filed Vitals:   09/19/13 0928  BP: 138/92  Pulse: 90  Temp: 98.1 F (36.7 C)  Resp: 18   Filed Vitals:   09/19/13 0928  Height: 5' (1.524 m)  Weight: 343 lb (155.584 kg)   Body mass index is 66.99 kg/(m^2). Ideal Body Weight: Weight in (lb) to have BMI = 25: 127.7  GEN: WDWN, NAD, Non-toxic, A & O x 3, morbid obesity, looks well HEENT:  Atraumatic, Normocephalic. Neck supple. No masses, No LAD. Ears and Nose: No external deformity. CV: RRR, No M/G/R. No JVD. No thrill. No extra heart sounds. PULM: CTA B, no wheezes, crackles, rhonchi. No retractions. No resp. distress. No accessory muscle use. ABD: S, NT, ND. No rebound. No HSM. EXTR: No c/c/e NEURO Normal gait.  PSYCH: Normally interactive. Conversant. Not depressed or anxious appearing.  Calm demeanor.  Gu: IUD stings visible in place. Fishy odor and slight inflammation of the vaginal canal.  Otherwise normal exam- no lesion or ulcer, no CMT, no adnexal masses or tenderness   Results for orders placed in visit on 09/19/13  POCT UA - MICROSCOPIC ONLY      Result Value Ref Range   WBC, Ur, HPF, POC neg     RBC, urine, microscopic neg     Bacteria, U Microscopic  trace     Mucus, UA neg     Epithelial cells, urine per micros 0-2     Crystals, Ur, HPF, POC neg     Casts, Ur, LPF, POC neg     Yeast, UA neg    POCT URINALYSIS DIPSTICK      Result Value Ref Range   Color, UA yellow     Clarity, UA clear     Glucose, UA neg     Bilirubin, UA neg     Ketones, UA neg     Spec Grav, UA 1.020     Blood, UA neg     pH, UA 8.5     Protein, UA trace     Urobilinogen, UA 2.0     Nitrite, UA neg     Leukocytes, UA Negative    POCT URINE PREGNANCY      Result Value Ref Range   Preg Test, Ur Negative    POCT WET PREP WITH KOH      Result Value Ref Range   Trichomonas, UA Negative     Clue Cells Wet Prep HPF POC 0-1     Epithelial Wet Prep HPF POC 0-6     Yeast Wet Prep HPF POC neg     Bacteria Wet Prep HPF POC 2+     RBC Wet Prep HPF POC 0-1     WBC Wet Prep HPF POC neg     KOH Prep POC Negative      Assessment and Plan: Dysuria - Plan: POCT UA - Microscopic Only, POCT urinalysis dipstick  Vaginal irritation - Plan: POCT urine pregnancy, POCT Wet Prep with KOH, GC/Chlamydia Probe Amp, Urine culture, fluconazole (DIFLUCAN) 150 MG tablet  Routine screening for STI (sexually transmitted infection) - Plan: GC/Chlamydia Probe Amp, Hepatitis B surface antibody, Hepatitis B surface antigen, Hepatitis C antibody, HIV antibody, RPR  Bacterial vaginosis - Plan: metroNIDAZOLE (FLAGYL) 500 MG tablet  Treat for BV and possible yeast vaginitis with flagyl and diflucan. Warned not to drink alcohol along with flagyl.  Await other labs and will follow-up with her.  She will let me know if not feeling better soon- Sooner if worse.    Signed Lamar Blinks, MD  Called and spoke with her on 2/18: discussed her positive Hep C screen and negative follow-up test.  She states this has happened to her before- at that time her confirmation test was also negative.  She is immune to hep B.  Will mail a copy of labs for her records.

## 2013-09-20 LAB — URINE CULTURE

## 2013-09-20 LAB — RPR

## 2013-09-21 LAB — GC/CHLAMYDIA PROBE AMP
CT PROBE, AMP APTIMA: NEGATIVE
GC PROBE AMP APTIMA: NEGATIVE

## 2013-09-22 ENCOUNTER — Telehealth: Payer: Self-pay

## 2013-09-22 NOTE — Telephone Encounter (Signed)
No answer. Try again later.

## 2013-09-22 NOTE — Telephone Encounter (Signed)
Advised that not all test are back will await treatment plan from [place]. She understood and will wait for call back.

## 2013-09-22 NOTE — Telephone Encounter (Signed)
PT HAD LABS DONE AND WOULD LIKE TO KNOW IF RESULTS ARE BACK. PLEASE CALL [phone]

## 2013-09-23 LAB — HEPATITIS C VRS RNA DETECT BY PCR-QUAL: HEPATITIS C VRS RNA BY PCR-QUAL: NEGATIVE

## 2013-09-24 ENCOUNTER — Encounter: Payer: Self-pay | Admitting: Family Medicine

## 2013-09-24 DIAGNOSIS — Z789 Other specified health status: Secondary | ICD-10-CM | POA: Insufficient documentation

## 2013-09-24 DIAGNOSIS — R768 Other specified abnormal immunological findings in serum: Secondary | ICD-10-CM | POA: Insufficient documentation

## 2013-10-07 ENCOUNTER — Ambulatory Visit: Payer: 59 | Admitting: Dietician

## 2014-01-18 ENCOUNTER — Telehealth: Payer: Self-pay | Admitting: Hematology and Oncology

## 2014-01-18 NOTE — Telephone Encounter (Signed)
S/W PATIENT AND GAVE NP APPT FOR 07/07 @ 1:30 W/DR. Faith.   [place] PACKET MAILED.

## 2014-01-18 NOTE — Telephone Encounter (Signed)
LEFT MESSAGE FOR PATIENT TO RETURN CALL TO SCHEDULE NP APPT.  °

## 2014-02-08 ENCOUNTER — Ambulatory Visit: Payer: 59 | Admitting: Hematology and Oncology

## 2014-02-08 ENCOUNTER — Ambulatory Visit: Payer: 59

## 2014-02-09 ENCOUNTER — Ambulatory Visit: Payer: 59

## 2014-02-09 ENCOUNTER — Telehealth: Payer: Self-pay | Admitting: Hematology and Oncology

## 2014-02-09 ENCOUNTER — Encounter: Payer: Self-pay | Admitting: Hematology and Oncology

## 2014-02-09 ENCOUNTER — Encounter (INDEPENDENT_AMBULATORY_CARE_PROVIDER_SITE_OTHER): Payer: Self-pay

## 2014-02-09 ENCOUNTER — Ambulatory Visit (HOSPITAL_BASED_OUTPATIENT_CLINIC_OR_DEPARTMENT_OTHER): Payer: 59 | Admitting: Hematology and Oncology

## 2014-02-09 VITALS — BP 145/95 | HR 106 | Temp 98.7°F | Resp 19 | Ht 60.0 in | Wt 345.0 lb

## 2014-02-09 DIAGNOSIS — D72829 Elevated white blood cell count, unspecified: Secondary | ICD-10-CM | POA: Insufficient documentation

## 2014-02-09 DIAGNOSIS — D500 Iron deficiency anemia secondary to blood loss (chronic): Secondary | ICD-10-CM

## 2014-02-09 DIAGNOSIS — R7989 Other specified abnormal findings of blood chemistry: Secondary | ICD-10-CM | POA: Insufficient documentation

## 2014-02-09 DIAGNOSIS — L732 Hidradenitis suppurativa: Secondary | ICD-10-CM

## 2014-02-09 DIAGNOSIS — D473 Essential (hemorrhagic) thrombocythemia: Secondary | ICD-10-CM

## 2014-02-09 DIAGNOSIS — D509 Iron deficiency anemia, unspecified: Secondary | ICD-10-CM | POA: Insufficient documentation

## 2014-02-09 DIAGNOSIS — D75838 Other thrombocytosis: Secondary | ICD-10-CM | POA: Insufficient documentation

## 2014-02-09 HISTORY — DX: Hidradenitis suppurativa: L73.2

## 2014-02-09 HISTORY — DX: Iron deficiency anemia, unspecified: D50.9

## 2014-02-09 MED ORDER — CEPHALEXIN 500 MG PO CAPS: 500.0000 mg | ORAL_CAPSULE | Freq: Two times a day (BID) | ORAL | Status: DC

## 2014-02-09 NOTE — Assessment & Plan Note (Signed)
This is likely related to infection and iron deficiency. Recommend observation only.

## 2014-02-09 NOTE — Assessment & Plan Note (Signed)
This is due to menorrhagia. I recommend a trial of oral iron supplement. If the anemia does not improve in 3 months, she needs to contact her primary care provider for other forms of birth control that may control menorrhagia at the same time.

## 2014-02-09 NOTE — Progress Notes (Signed)
Checked in new pt with no financial concerns. °

## 2014-02-09 NOTE — Telephone Encounter (Signed)
Gave pt appt for lab and MD for October

## 2014-02-09 NOTE — Assessment & Plan Note (Signed)
She has infection underneath the breast and under her armpit. I will prescribe a course of Keflex to see if this would help.

## 2014-02-09 NOTE — Progress Notes (Signed)
[place] CONSULT NOTE  Patient Care Team: Provider Not In System as PCP - General  CHIEF COMPLAINTS/PURPOSE OF CONSULTATION:  Leukocytosis, microcytic anemia and thrombocytosis  HISTORY OF PRESENTING ILLNESS:  Lorraine Chambers 28 y.o. female is here because of abnormal CBC.Marland Kitchen  She was found to have abnormal CBC from routine blood work. She had chronic leukocytosis for many years. The highest white blood cell count was 17.4. Her most recent hemoglobin was 11.8 with microcytosis and platelet count was at elevated at 407. She has chronic skin infection under her armpit and underneath her breast. The last prescription antibiotics was more than 3 months ago There is not reported symptoms of sinus congestion, cough, urinary frequency/urgency or dysuria, diarrhea or joint swelling/pain   She had no prior history or diagnosis of cancer. Her age appropriate screening programs are up-to-date. The patient has no prior diagnosis of autoimmune disease and was not prescribed corticosteroids related products. She also complained of excessive menorrhagia. Currently, she has IUD placed for both control purpose. The patient denies any recent signs or symptoms of bleeding such as spontaneous epistaxis, hematuria or hematochezia.   MEDICAL HISTORY:  Past Medical History  Diagnosis Date  . Arthritis   . Hypertension     Pt reports episode of HTN in 2009  . Thyroid disease   . Hydradenitis 02/09/2014  . Iron deficiency anemia, unspecified 02/09/2014    SURGICAL HISTORY: Past Surgical History  Procedure Laterality Date  . Wisdom tooth extraction      SOCIAL HISTORY: History   Social History  . Marital Status: Single    Spouse Name: N/A    Number of Children: N/A  . Years of Education: N/A   Occupational History  . Not on file.   Social History Main Topics  . Smoking status: Never Smoker   . Smokeless tobacco: Never Used  . Alcohol Use: Yes     Comment: occassional  . Drug  Use: No  . Sexual Activity: Yes    Birth Control/ Protection: IUD   Other Topics Concern  . Not on file   Social History Narrative  . No narrative on file    FAMILY HISTORY: Family History  Problem Relation Age of Onset  . Stroke Other   . Heart failure Other   . Heart disease Mother   . Diabetes Mother   . Hypertension Mother   . Cancer Maternal Uncle     brain ca    ALLERGIES:  has No Known Allergies.  MEDICATIONS:  Current Outpatient Prescriptions  Medication Sig Dispense Refill  . Levonorgestrel (SKYLA) 13.5 MG IUD by Intrauterine route.      Marland Kitchen LEVOTHYROXINE SODIUM PO Take by mouth daily.      . metFORMIN (GLUCOPHAGE) 500 MG tablet Take by mouth 2 (two) times daily with a meal.      . VITAMIN D, ERGOCALCIFEROL, PO Take by mouth once a week.      . cephALEXin (KEFLEX) 500 MG capsule Take 1 capsule (500 mg total) by mouth 2 (two) times daily.  14 capsule  0   No current facility-administered medications for this visit.    REVIEW OF SYSTEMS:   Constitutional: Denies fevers, chills or abnormal night sweats Eyes: Denies blurriness of vision, double vision or watery eyes Ears, nose, mouth, throat, and face: Denies mucositis or sore throat Respiratory: Denies cough, dyspnea or wheezes Cardiovascular: Denies palpitation, chest discomfort or lower extremity swelling Gastrointestinal:  Denies nausea, heartburn or change in bowel  habits Skin: Denies abnormal skin rashes Lymphatics: Denies new lymphadenopathy or easy bruising Neurological:Denies numbness, tingling or new weaknesses Behavioral/Psych: Mood is stable, no new changes  All other systems were reviewed with the patient and are negative.  PHYSICAL EXAMINATION: ECOG PERFORMANCE STATUS: 0 - Asymptomatic  Filed Vitals:   02/09/14 1344  BP: 145/95  Pulse: 106  Temp: 98.7 F (37.1 C)  Resp: 19   Filed Weights   02/09/14 1344  Weight: 345 lb (156.491 kg)    GENERAL:alert, no distress and comfortable. She  is morbidly obese SKIN: Noted skin infection underneath her breast resembling acne.  EYES: normal, conjunctiva are pink and non-injected, sclera clear OROPHARYNX:no exudate, no erythema and lips, buccal mucosa, and tongue normal  NECK: supple, thyroid normal size, non-tender, without nodularity LYMPH:  no palpable lymphadenopathy in the cervical, axillary or inguinal LUNGS: clear to auscultation and percussion with normal breathing effort HEART: regular rate & rhythm and no murmurs and no lower extremity edema ABDOMEN:abdomen soft, non-tender and normal bowel sounds Musculoskeletal:no cyanosis of digits and no clubbing  PSYCH: alert & oriented x 3 with fluent speech NEURO: no focal motor/sensory deficits  LABORATORY DATA:  I have reviewed the data as listed ASSESSMENT & PLAN Leukocytosis This is chronic in nature, likely due to morbid obesity. She also has chronic hydradenitis. I will prescribe antibiotic therapy for her to recheck in 3 months.  Hydradenitis She has infection underneath the breast and under her armpit. I will prescribe a course of Keflex to see if this would help.  Iron deficiency anemia, unspecified This is due to menorrhagia. I recommend a trial of oral iron supplement. If the anemia does not improve in 3 months, she needs to contact her primary care provider for other forms of birth control that may control menorrhagia at the same time.  Reactive thrombocytosis This is likely related to infection and iron deficiency. Recommend observation only.

## 2014-02-09 NOTE — Assessment & Plan Note (Signed)
This is chronic in nature, likely due to morbid obesity. She also has chronic hydradenitis. I will prescribe antibiotic therapy for her to recheck in 3 months.

## 2014-03-09 ENCOUNTER — Ambulatory Visit (INDEPENDENT_AMBULATORY_CARE_PROVIDER_SITE_OTHER): Payer: 59 | Admitting: Family Medicine

## 2014-03-09 ENCOUNTER — Telehealth: Payer: Self-pay | Admitting: *Deleted

## 2014-03-09 VITALS — BP 130/80 | HR 94 | Temp 98.2°F | Resp 16 | Ht 71.0 in | Wt 349.0 lb

## 2014-03-09 DIAGNOSIS — L02415 Cutaneous abscess of right lower limb: Secondary | ICD-10-CM

## 2014-03-09 DIAGNOSIS — L02419 Cutaneous abscess of limb, unspecified: Secondary | ICD-10-CM

## 2014-03-09 DIAGNOSIS — N62 Hypertrophy of breast: Secondary | ICD-10-CM

## 2014-03-09 DIAGNOSIS — L723 Sebaceous cyst: Secondary | ICD-10-CM

## 2014-03-09 DIAGNOSIS — L03119 Cellulitis of unspecified part of limb: Secondary | ICD-10-CM

## 2014-03-09 DIAGNOSIS — L732 Hidradenitis suppurativa: Secondary | ICD-10-CM

## 2014-03-09 MED ORDER — SULFAMETHOXAZOLE-TMP DS 800-160 MG PO TABS: 1.0000 | ORAL_TABLET | Freq: Two times a day (BID) | ORAL | Status: DC

## 2014-03-09 NOTE — Patient Instructions (Signed)
Take the sulfamethoxazole one twice daily for antibiotic for 10 days.   Return in about 2 weeks for [place] to recheck the places.  Return sooner if at any time things are getting worse.

## 2014-03-09 NOTE — Telephone Encounter (Signed)
Pt left VM states the lump under her arm has "gotten bigger"  States Dr. Alvy Bimler had prescribed a antibiotic for this on her last visit 7/07.   Called pt back and got her VM.Marland Kitchen Left her a message to return nurse's call to get more information.

## 2014-03-09 NOTE — Progress Notes (Signed)
Subjective: 28 year old lady who [facility] with a lesion in her left axilla. They sent her on over to oncology because of the elevated white count and mild anemia. The oncologist felt that this was hidradenitis, placed her on cephalexin. She has had hidradenitis problems sometime in the past.  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 No family history of breast disease. No personal history of breast disease except for getting some infected cyst on the breast  She also complains of a painful similar lesion behind her right thigh. She cannot see the area and cannot reach it very well.  She is obese and plans to one day he had a gastric bypass done. Her large breast give her back pain.  Objective: Young lady with macromastia. No obvious lesions on her breast. She has had a 3-4 cm deep lesion in the left breast.. Both breasts were examined and have old scarring from previous skin infections. No masses could be appreciated but difficult to get a very good exam. She has a very tender small open cyst on her right posterior thigh. No drainage could be expressed but the hole was cultured. No cervical nodes were appreciated.  Assessment: Hidradenitis with an infected cyst in the left axilla. Cystic abscess, draining, right posterior thigh Obesity Macromastia  Plan: Needs to be treated with antibiotics. If he gets worse we'll need to be drained, but today feels like it's too deep to be readily I&D. A need a surgical excision of skin in all worse rather than better on the medication. Return in a couple of weeks for a recheck referred to a surgeon for further evaluation and/or excision of the lump which is most consistent with infection. Mammogram or ultrasound can be considered at that time also.

## 2014-03-10 NOTE — Telephone Encounter (Signed)
Called pt again in response to VM she left yesterday.  Asked pt to return nurse's call.

## 2014-03-13 LAB — WOUND CULTURE: GRAM STAIN: NONE SEEN

## 2014-03-30 ENCOUNTER — Ambulatory Visit (INDEPENDENT_AMBULATORY_CARE_PROVIDER_SITE_OTHER): Payer: 59 | Admitting: Family Medicine

## 2014-03-30 VITALS — BP 132/88 | HR 101 | Temp 98.1°F | Resp 18 | Ht 61.0 in | Wt 349.2 lb

## 2014-03-30 DIAGNOSIS — IMO0002 Reserved for concepts with insufficient information to code with codable children: Secondary | ICD-10-CM

## 2014-03-30 DIAGNOSIS — E119 Type 2 diabetes mellitus without complications: Secondary | ICD-10-CM

## 2014-03-30 MED ORDER — DOXYCYCLINE HYCLATE 100 MG PO TABS: 100.0000 mg | ORAL_TABLET | Freq: Two times a day (BID) | ORAL | Status: DC

## 2014-03-30 NOTE — Progress Notes (Signed)
Chief Complaint:  Chief Complaint  Patient presents with  . Follow-up    abscess    HPI: Lorraine Chambers is a 28 y.o. female who is here for  Recheck for left axilla abscess from 03/09/14.  She was seen prior and was given Bactrim and did not have an IandD since it was felt it had not come to the surface enough to IandD. Since then the lesion has burst and ahs beens elf draining, primarily clear yellow dc with some pus if she uses deodorant. She has had no warmth or redness in the area. Denies fevers or chills. She has a h/o hydradenitis.  She is a CNA at [employer]  Past Medical History  Diagnosis Date  . Arthritis   . Hypertension     Pt reports episode of HTN in 2009  . Thyroid disease   . Hydradenitis 02/09/2014  . Iron deficiency anemia, unspecified 02/09/2014   Past Surgical History  Procedure Laterality Date  . Wisdom tooth extraction     History   Social History  . Marital Status: Single    Spouse Name: N/A    Number of Children: N/A  . Years of Education: N/A   Social History Main Topics  . Smoking status: Never Smoker   . Smokeless tobacco: Never Used  . Alcohol Use: Yes     Comment: occassional  . Drug Use: No  . Sexual Activity: Yes    Birth Control/ Protection: IUD   Other Topics Concern  . None   Social History Narrative  . None   Family History  Problem Relation Age of Onset  . Stroke Other   . Heart failure Other   . Heart disease Mother   . Diabetes Mother   . Hypertension Mother   . Cancer Maternal Uncle     brain ca   No Known Allergies Prior to Admission medications   Medication Sig Start Date End Date Taking? Authorizing Provider  Levonorgestrel (SKYLA) 13.5 MG IUD by Intrauterine route.   Yes Historical Provider, MD  LEVOTHYROXINE SODIUM PO Take by mouth daily.   Yes Historical Provider, MD  metFORMIN (GLUCOPHAGE) 500 MG tablet Take by mouth 2 (two) times daily with a meal.   Yes Historical Provider, MD  VITAMIN D,  ERGOCALCIFEROL, PO Take by mouth once a week.   Yes Historical Provider, MD     ROS: The patient denies fevers, chills, night sweats, unintentional weight loss, chest pain, palpitations, wheezing, dyspnea on exertion, nausea, vomiting, abdominal pain, dysuria, hematuria, melena, numbness, weakness, or tingling.   All other systems have been reviewed and were otherwise negative with the exception of those mentioned in the HPI and as above.    PHYSICAL EXAM: Filed Vitals:   03/30/14 1143  BP: 132/88  Pulse: 101  Temp: 98.1 F (36.7 C)  Resp: 18   Filed Vitals:   03/30/14 1143  Height: 5\' 1"  (1.549 m)  Weight: 349 lb 3.2 oz (158.396 kg)   Body mass index is 66.01 kg/(m^2).  General: Alert, no acute distress , morbidly obese AA female HEENT:  Normocephalic, atraumatic, oropharynx patent. EOMI, PERRLA Cardiovascular:  Regular rate and rhythm, no rubs murmurs or gallops.  Respiratory: Clear to auscultation bilaterally.  No wheezes, rales, or rhonchi.  No cyanosis, no use of accessory musculature GI: No organomegaly, abdomen is soft and non-tender, positive bowel sounds.  No masses. Skin: + draining serosainguinous cyst, minimally tender, nonerythematous, no warmth, there is some  pustules surrounding the area Neurologic: Facial musculature symmetric. Psychiatric: Patient is appropriate throughout our interaction. Musculoskeletal: Gait intact.   LABS: Results for orders placed in visit on 03/09/14  WOUND CULTURE      Result Value Ref Range   Gram Stain Rare     Gram Stain WBC present-predominately Mononuclear     Gram Stain Few Squamous Epithelial Cells Present     Gram Stain No Organisms Seen     Organism ID, Bacteria Multiple Organisms Present,None Predominant       EKG/XRAY:   Primary read interpreted by Dr. Marin Comment at [facility].   ASSESSMENT/PLAN: Encounter Diagnoses  Name Primary?  . Cellulitis and abscess of upper arm and forearm Yes  . Type II or unspecified type diabetes  mellitus without mention of complication, not stated as uncontrolled    28 y.o morbidly obese AA female with a history hydradenitis who was recently treated with a 10 day course of Bactrim for left axilla infected sebaceous cyst/abscess which has actually started self draianing but still has some purulence to it. She was on Bactrim but has finished. She may been on keflex in [place] for this as well. Since the area is self draining but there are pustular lesions around the draining site will go ahead and put her on doxycycline 100 mg BID. There is a risk of C diff and she understands this. She is going to try it. I am not sure what else to do. Advise to not shave or use deodorant on that arm.   She wants to know about her hep C confirmation test. It was negative. [place] end her a copy of it in the mail.   F/u prn  Gross sideeffects, risk and benefits, and alternatives of medications d/w patient. Patient is aware that all medications have potential sideeffects and we are unable to predict every sideeffect or drug-drug interaction that may occur.  Manuel Dall, [place], DO 03/30/2014 1:52 PM

## 2014-03-31 ENCOUNTER — Encounter: Payer: Self-pay | Admitting: Dietician

## 2014-03-31 ENCOUNTER — Encounter: Payer: 59 | Attending: Family Medicine | Admitting: Dietician

## 2014-03-31 VITALS — Ht 60.0 in | Wt 346.4 lb

## 2014-03-31 DIAGNOSIS — Z713 Dietary counseling and surveillance: Secondary | ICD-10-CM | POA: Insufficient documentation

## 2014-03-31 DIAGNOSIS — E119 Type 2 diabetes mellitus without complications: Secondary | ICD-10-CM | POA: Insufficient documentation

## 2014-03-31 DIAGNOSIS — E669 Obesity, unspecified: Secondary | ICD-10-CM

## 2014-03-31 DIAGNOSIS — Z6841 Body Mass Index (BMI) 40.0 and over, adult: Secondary | ICD-10-CM | POA: Insufficient documentation

## 2014-03-31 NOTE — Progress Notes (Signed)
  Medical Nutrition Therapy:  Appt start time: 430 end time:  530.   Assessment:  Primary concerns today: Lorraine Chambers is here today to discuss her weight and diabetes. She was recently diagnosed with type 2 diabetes. Prescribed Metformin, noncompliant. Also noncompliant with thyroid medicine. Has thought about gastric bypass but admits that she needs to work on emotional eating. She attended the [facility] online seminar for bariatric surgery. She is interested in diabetes core classes. 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  Preferred Learning Style:   No preference indicated   Learning Readiness:   Contemplating  Ready   MEDICATIONS: see list   DIETARY INTAKE:  Avoided foods include pork and beef.    Erratic eating pattern; often eats 1-2x a day.  Estimated energy needs: 1800-2000 calories 200-225 g carbohydrates  Progress Towards Goal(s):  In progress.   Nutritional Diagnosis:  [place]-3.3 Overweight/obesity As related to excessive energy intake, inappropriate food choices, erratic eating pattern.  As evidenced by BMI 67 and type 2 diabetes diagnosis.    Intervention:  Nutrition counseling provided.  -Work on taking medicine regularly -Have something to eat every 3-5 hours -Look into counseling for emotional eating -Increase exercise as tolerated  Breakfast: 1/2 cup of oatmeal with cinnamon and Splenda Boiled eggs  OR   3/4 cup of Special K with 3/4 cup soymilk Boiled eggs    Lunch and Dinner: 1/2 cup of starch  3-4 oz of protein As many non-starchy vegetables as you want  For example: A big salad with lean protein, leftovers, [place] sandwich with wheat bread and veggies   Snacks: (Have something with 10-20 grams of carbs and a protein food) *refer to snack list   -Cheese and crackers -Peanut butter and fruit -Dannon Light and Fit Greek yogurt -[employer] protein bar  Teaching Method Utilized:  [employer] Hands on  Handouts given during visit  include:  Meal planning card  MyPlate  01X CHO + protein snacks  Barriers to learning/adherence to lifestyle change: emotional stress  Demonstrated degree of understanding via:  Teach Back   Monitoring/Evaluation:  Dietary intake, exercise, and body weight prn. Patient enrolled in Diabetes core classes at [facility].

## 2014-03-31 NOTE — Patient Instructions (Addendum)
-  Work on taking medicine regularly -Have something to eat every 3-5 hours -Look into counseling for emotional eating -Increase exercise as tolerated  Breakfast: 1/2 cup of oatmeal with cinnamon and Splenda Boiled eggs  OR   3/4 cup of Special K with 3/4 cup soymilk Boiled eggs    Lunch and Dinner: 1/2 cup of starch  3-4 oz of protein As many non-starchy vegetables as you want  For example: A big salad with lean protein, leftovers, [place] sandwich with wheat bread and veggies   Snacks: (Have something with 10-20 grams of carbs and a protein food) *refer to snack list   -Cheese and crackers -Peanut butter and fruit -Dannon Light and Fit Greek yogurt -[employer] protein bar

## 2014-04-17 ENCOUNTER — Encounter: Payer: 59 | Attending: Family Medicine

## 2014-04-17 VITALS — Ht 60.0 in | Wt 346.9 lb

## 2014-04-17 DIAGNOSIS — Z6841 Body Mass Index (BMI) 40.0 and over, adult: Secondary | ICD-10-CM | POA: Diagnosis not present

## 2014-04-17 DIAGNOSIS — Z713 Dietary counseling and surveillance: Secondary | ICD-10-CM | POA: Diagnosis present

## 2014-04-17 DIAGNOSIS — E119 Type 2 diabetes mellitus without complications: Secondary | ICD-10-CM | POA: Insufficient documentation

## 2014-04-18 ENCOUNTER — Ambulatory Visit (INDEPENDENT_AMBULATORY_CARE_PROVIDER_SITE_OTHER): Payer: 59 | Admitting: Physician Assistant

## 2014-04-18 VITALS — BP 124/90 | HR 102 | Temp 98.1°F | Resp 18 | Ht 61.5 in | Wt 342.0 lb

## 2014-04-18 DIAGNOSIS — J069 Acute upper respiratory infection, unspecified: Secondary | ICD-10-CM

## 2014-04-18 DIAGNOSIS — B9789 Other viral agents as the cause of diseases classified elsewhere: Principal | ICD-10-CM

## 2014-04-18 MED ORDER — GUAIFENESIN ER 1200 MG PO TB12: 1.0000 | ORAL_TABLET | Freq: Two times a day (BID) | ORAL | Status: AC

## 2014-04-18 MED ORDER — FLUTICASONE PROPIONATE 50 MCG/ACT NA SUSP: 2.0000 | Freq: Every day | NASAL | Status: DC

## 2014-04-18 MED ORDER — HYDROCOD POLST-CHLORPHEN POLST 10-8 MG/5ML PO LQCR: 5.0000 mL | Freq: Two times a day (BID) | ORAL | Status: DC | PRN

## 2014-04-18 NOTE — Progress Notes (Signed)
Subjective:     Patient ID: Lorraine Chambers, female   DOB: 28-Jan-1986, 28 y.o.   MRN: [id number]  HPI Pt presents to clinic with cold symptoms that started yesterday.  She has congestion with PND and no rhinorrhea - sore throat the is intermittent and a dry cough.  She thinks she has EIA but no diagnosis known - she is not currently wheezing or having trouble with her breathing other than her nose is so congested.    OTC meds - ibuprofen Sick contacts - ? family  Review of Systems  Constitutional: Positive for chills. Negative for fever.  HENT: Positive for congestion, postnasal drip, sinus pressure and sore throat. Negative for rhinorrhea.   Respiratory: Positive for cough. Negative for wheezing.        Non-smoker, ? EIA  Gastrointestinal: Positive for nausea. Negative for vomiting and diarrhea.  Allergic/Immunologic: Negative for environmental allergies.  Neurological: Positive for headaches.       Objective:   Physical Exam  Vitals reviewed. Constitutional: She is oriented to person, place, and time. She appears well-developed and well-nourished.  HENT:  Head: Normocephalic and atraumatic.  Right Ear: Hearing, tympanic membrane and external ear normal.  Left Ear: Hearing, tympanic membrane and external ear normal.  Nose: Mucosal edema (trubinates touching septum bilaterally) present.  Mouth/Throat: Uvula is midline, oropharynx is clear and moist and mucous membranes are normal.  Cerumen bilaterally  Eyes: Conjunctivae are normal.  Neck: Normal range of motion.  Cardiovascular: Normal rate, regular rhythm and normal heart sounds.   No murmur heard. Pulmonary/Chest: Effort normal and breath sounds normal. She has no wheezes.  Lymphadenopathy:    She has no cervical adenopathy.  Neurological: She is alert and oriented to person, place, and time.  Skin: Skin is warm and dry.  Psychiatric: She has a normal mood and affect. Her behavior is normal. Judgment and thought content  normal.       Assessment:     Viral URI with cough - Plan: fluticasone (FLONASE) 50 MCG/ACT nasal spray, Guaifenesin (MUCINEX MAXIMUM STRENGTH) 1200 MG TB12, chlorpheniramine-HYDROcodone (TUSSIONEX PENNKINETIC ER) 10-8 MG/5ML LQCR  Symptomatic care d/w pt.  Windell Hummingbird PA-C  [facility] 04/18/2014 9:52 AM      Plan:

## 2014-04-25 NOTE — Progress Notes (Signed)
Patient was seen on 04/17/14 for the complete diabetes self-management series at the [facility]. This is a part of the [facility] [employer].  Handouts given during class include:  Living Well with Diabetes book  Carb Counting and Meal Planning book  Meal Plan Card  Carbohydrate guide  Meal planning worksheetThe diabetes portion plate  Low Carbohydrate Snack Suggestions  A1c to eAG Conversion Chart  Diabetes Medications  Stress Management  Diabetes Recommended Care Schedule  Diabetes Success Plan  Core Class Satisfaction Survey  The following learning objectives were met by the patient during this course:  Describe diabetes  State some common risk factors for diabetes  Defines the role of glucose and insulin  Identifies type of diabetes and pathophysiology  Describe the relationship between diabetes and cardiovascular risk  State the members of the Healthcare Team  States the rationale for glucose monitoring  State when to test glucose  State their individual Target Range  State the importance of logging glucose readings  Describe how to interpret glucose readings  Identifies A1C target  Explain the correlation between A1c and eAG values  State symptoms and treatment of high blood glucose  State symptoms and treatment of low blood glucose  Explain proper technique for glucose testing  Identifies proper sharps disposal  Describe the role of different macronutrients on glucose  Explain how carbohydrates affect blood glucose  State what foods contain the most carbohydrates  Demonstrate carbohydrate counting  Demonstrate how to read Nutrition Facts food label  Describe effects of various fats on heart health  Describe the importance of good nutrition for health and healthy eating strategies  Describe techniques for managing your shopping, cooking and meal planning  List strategies to follow meal plan when  dining out  Describe the effects of alcohol on glucose and how to use it safely   State the amount of activity recommended for healthy living   Describe activities suitable for individual needs   Identify ways to regularly incorporate activity into daily life   Identify barriers to activity and ways to over come these barriers  Identify diabetes medications being personally used and their primary action for lowering glucose and possible side effects   Describe role of stress on blood glucose and develop strategies to address psychosocial issues   Identify diabetes complications and ways to prevent them  Explain how to manage diabetes during illness   Evaluate success in meeting personal goal   Establish 2-3 goals that they will plan to diligently work on until they return for the  79-monthfollow-up visit  [facility]

## 2014-05-02 NOTE — Patient Instructions (Signed)
   I will count my carb choices at most meals and snacks  I will be active at least 4 times a week  I will take my diabetes medications as scheduled  I will eat less unhealthy fats by eating less fat food  To help manage stress I will  See  Psychologist    Potential barriers that mayexist towards my accomplishing my goals: stress, lack of family support  My diabetes support plan: [facility] support group, On-Line resources

## 2014-05-07 ENCOUNTER — Encounter: Payer: Self-pay | Admitting: *Deleted

## 2014-05-07 ENCOUNTER — Other Ambulatory Visit (HOSPITAL_BASED_OUTPATIENT_CLINIC_OR_DEPARTMENT_OTHER): Payer: 59

## 2014-05-07 ENCOUNTER — Telehealth: Payer: Self-pay | Admitting: *Deleted

## 2014-05-07 ENCOUNTER — Telehealth: Payer: Self-pay | Admitting: Hematology and Oncology

## 2014-05-07 ENCOUNTER — Other Ambulatory Visit: Payer: 59

## 2014-05-07 DIAGNOSIS — D500 Iron deficiency anemia secondary to blood loss (chronic): Secondary | ICD-10-CM

## 2014-05-07 DIAGNOSIS — D72829 Elevated white blood cell count, unspecified: Secondary | ICD-10-CM

## 2014-05-07 DIAGNOSIS — D509 Iron deficiency anemia, unspecified: Secondary | ICD-10-CM

## 2014-05-07 LAB — FERRITIN CHCC: Ferritin: 292 ng/ml — ABNORMAL HIGH (ref 9–269)

## 2014-05-07 LAB — CBC & DIFF AND RETIC
BASO%: 0.3 % (ref 0.0–2.0)
Basophils Absolute: 0 10*3/uL (ref 0.0–0.1)
EOS%: 1.4 % (ref 0.0–7.0)
Eosinophils Absolute: 0.2 10*3/uL (ref 0.0–0.5)
HCT: 38.4 % (ref 34.8–46.6)
HGB: 11.9 g/dL (ref 11.6–15.9)
Immature Retic Fract: 14.3 % — ABNORMAL HIGH (ref 1.60–10.00)
LYMPH#: 4.4 10*3/uL — AB (ref 0.9–3.3)
LYMPH%: 29 % (ref 14.0–49.7)
MCH: 23.8 pg — ABNORMAL LOW (ref 25.1–34.0)
MCHC: 31 g/dL — ABNORMAL LOW (ref 31.5–36.0)
MCV: 77 fL — AB (ref 79.5–101.0)
MONO#: 0.9 10*3/uL (ref 0.1–0.9)
MONO%: 6 % (ref 0.0–14.0)
NEUT%: 63.3 % (ref 38.4–76.8)
NEUTROS ABS: 9.7 10*3/uL — AB (ref 1.5–6.5)
Platelets: 427 10*3/uL — ABNORMAL HIGH (ref 145–400)
RBC: 4.99 10*6/uL (ref 3.70–5.45)
RDW: 14 % (ref 11.2–14.5)
RETIC CT ABS: 120.26 10*3/uL — AB (ref 33.70–90.70)
Retic %: 2.41 % — ABNORMAL HIGH (ref 0.70–2.10)
WBC: 15.3 10*3/uL — ABNORMAL HIGH (ref 3.9–10.3)

## 2014-05-07 LAB — IRON AND TIBC CHCC
%SAT: 6 % — ABNORMAL LOW (ref 21–57)
Iron: 28 ug/dL — ABNORMAL LOW (ref 41–142)
TIBC: 435 ug/dL (ref 236–444)
UIBC: 407 ug/dL — ABNORMAL HIGH (ref 120–384)

## 2014-05-07 LAB — SEDIMENTATION RATE: SED RATE: 27 mm/h — AB (ref 0–22)

## 2014-05-07 NOTE — Telephone Encounter (Signed)
Pt states she can come in early for iron infusion on 10/6. 0945 Dr Alvy Bimler, 1045 Infusion

## 2014-05-07 NOTE — Telephone Encounter (Signed)
done

## 2014-05-07 NOTE — Telephone Encounter (Signed)
returned pt call and r/s lab to earlier time...done....pt aware of time

## 2014-05-07 NOTE — Telephone Encounter (Signed)
Per desk RN I have scheduled appt. RN to call patient

## 2014-05-11 ENCOUNTER — Encounter: Payer: Self-pay | Admitting: Hematology and Oncology

## 2014-05-11 ENCOUNTER — Telehealth: Payer: Self-pay | Admitting: Hematology and Oncology

## 2014-05-11 ENCOUNTER — Ambulatory Visit (HOSPITAL_BASED_OUTPATIENT_CLINIC_OR_DEPARTMENT_OTHER): Payer: 59

## 2014-05-11 ENCOUNTER — Ambulatory Visit (HOSPITAL_BASED_OUTPATIENT_CLINIC_OR_DEPARTMENT_OTHER): Payer: 59 | Admitting: Hematology and Oncology

## 2014-05-11 VITALS — BP 136/89 | HR 80 | Temp 98.2°F | Resp 20

## 2014-05-11 VITALS — BP 149/102 | HR 96 | Temp 98.2°F | Resp 20 | Ht 60.0 in | Wt 343.1 lb

## 2014-05-11 DIAGNOSIS — D72829 Elevated white blood cell count, unspecified: Secondary | ICD-10-CM

## 2014-05-11 DIAGNOSIS — D732 Chronic congestive splenomegaly: Secondary | ICD-10-CM

## 2014-05-11 DIAGNOSIS — R7989 Other specified abnormal findings of blood chemistry: Secondary | ICD-10-CM

## 2014-05-11 DIAGNOSIS — D509 Iron deficiency anemia, unspecified: Secondary | ICD-10-CM

## 2014-05-11 DIAGNOSIS — D75838 Other thrombocytosis: Secondary | ICD-10-CM

## 2014-05-11 DIAGNOSIS — L732 Hidradenitis suppurativa: Secondary | ICD-10-CM

## 2014-05-11 DIAGNOSIS — D473 Essential (hemorrhagic) thrombocythemia: Secondary | ICD-10-CM

## 2014-05-11 MED ORDER — SODIUM CHLORIDE 0.9 % IV SOLN
Freq: Once | INTRAVENOUS | Status: AC
  Administered 2014-05-11: 11:00:00 via INTRAVENOUS

## 2014-05-11 MED ORDER — FERUMOXYTOL INJECTION 510 MG/17 ML
1020.0000 mg | Freq: Once | INTRAVENOUS | Status: AC
  Administered 2014-05-11: 1020 mg via INTRAVENOUS
  Filled 2014-05-11: qty 34

## 2014-05-11 NOTE — Assessment & Plan Note (Signed)
This is likely related to infection and iron deficiency. Recommend observation only.

## 2014-05-11 NOTE — Progress Notes (Signed)
Lorraine Chambers OFFICE PROGRESS NOTE  PROVIDER NOT IN SYSTEM SUMMARY OF HEMATOLOGIC HISTORY:  She was found to have abnormal CBC from routine blood work. She had chronic leukocytosis for many years. The highest white blood cell count was 17.4. Her most recent hemoglobin was 11.8 with microcytosis and platelet count was at elevated at 407. She has chronic skin infection under her armpit and underneath her breast. The last prescription antibiotics was more than 3 months ago There is not reported symptoms of sinus congestion, cough, urinary frequency/urgency or dysuria, diarrhea or joint swelling/pain   She had no prior history or diagnosis of cancer. Her age appropriate screening programs are up-to-date. The patient has no prior diagnosis of autoimmune disease and was not prescribed corticosteroids related products. She also complained of excessive menorrhagia. Currently, she has IUD placed for birth control purpose. The patient denies any recent signs or symptoms of bleeding such as spontaneous epistaxis, hematuria or hematochezia.  INTERVAL HISTORY: Lorraine Chambers 28 y.o. female returns for further followup. She complained of fatigue. She could not tolerate oral iron supplements. She had recurrent hydradenitis beneath her left arm pit  I have reviewed the past medical history, past surgical history, social history and family history with the patient and they are unchanged from previous note.  ALLERGIES:  has No Known Allergies.  MEDICATIONS:  Current Outpatient Prescriptions  Medication Sig Dispense Refill  . chlorpheniramine-HYDROcodone (TUSSIONEX PENNKINETIC ER) 10-8 MG/5ML LQCR Take 5 mLs by mouth every 12 (twelve) hours as needed for cough.  70 mL  0  . fluticasone (FLONASE) 50 MCG/ACT nasal spray Place 2 sprays into both nostrils daily.  16 g  1  . Levonorgestrel (SKYLA) 13.5 MG IUD by Intrauterine route.      Marland Kitchen LEVOTHYROXINE SODIUM PO Take 100 mg by mouth daily.        . metFORMIN (GLUCOPHAGE) 500 MG tablet Take by mouth 2 (two) times daily with a meal.      . VITAMIN D, ERGOCALCIFEROL, PO Take by mouth once a week.       No current facility-administered medications for this visit.     REVIEW OF SYSTEMS:   Constitutional: Denies fevers, chills or night sweats Eyes: Denies blurriness of vision Ears, nose, mouth, throat, and face: Denies mucositis or sore throat Respiratory: Denies cough, dyspnea or wheezes Cardiovascular: Denies palpitation, chest discomfort or lower extremity swelling Gastrointestinal:  Denies nausea, heartburn or change in bowel habits Lymphatics: Denies new lymphadenopathy or easy bruising Neurological:Denies numbness, tingling or new weaknesses Behavioral/Psych: Mood is stable, no new changes  All other systems were reviewed with the patient and are negative.  PHYSICAL EXAMINATION: ECOG PERFORMANCE STATUS: 1 - Symptomatic but completely ambulatory  Filed Vitals:   05/11/14 0939  BP: 149/102  Pulse: 96  Temp: 98.2 F (36.8 C)  Resp: 20   Filed Weights   05/11/14 0939  Weight: 343 lb 1.6 oz (155.629 kg)    GENERAL:alert, no distress and comfortable. She is morbidly obese SKIN: Noted skin acne EYES: normal, Conjunctiva are pink and non-injected, sclera clear Musculoskeletal:no cyanosis of digits and no clubbing  NEURO: alert & oriented x 3 with fluent speech, no focal motor/sensory deficits  LABORATORY DATA:  I have reviewed the data as listed No results found for this or any previous visit (from the past 48 hour(s)).  Lab Results  Component Value Date   WBC 15.3* 05/07/2014   HGB 11.9 05/07/2014   HCT 38.4 05/07/2014   MCV  77.0* 05/07/2014   PLT 427* 05/07/2014    ASSESSMENT & PLAN:  Leukocytosis This is chronic in nature, likely due to morbid obesity. She also has chronic hydradenitis. I will observe for now  Hydradenitis She has infection underneath the breast and under her armpit. I will recommend  increase hygiene. If this is persistent, she may need to general surgery consultation for incision and drainage   Iron deficiency anemia Her iron studies are hard to interpret. Everything is consistent with iron deficiency anemia except for elevated ferritin which I think is inappropriately due to acute phase reactant. The most likely cause of her anemia is due to chronic blood loss. We discussed some of the risks, benefits, and alternatives of intravenous iron infusions. The patient is symptomatic from anemia and the iron level is critically low. She tolerated oral iron supplement poorly and desires to achieved higher levels of iron faster for adequate hematopoesis. Some of the side-effects to be expected including risks of infusion reactions, phlebitis, headaches, nausea and fatigue.  The patient is willing to proceed. Patient education material was dispensed.    Reactive thrombocytosis This is likely related to infection and iron deficiency. Recommend observation only.     All questions were answered. The patient knows to call the clinic with any problems, questions or concerns. No barriers to learning was detected.  I spent 25 minutes counseling the patient face to face. The total time spent in the appointment was 30 minutes and more than 50% was on counseling.     [facility], [place], MD 05/11/2014 9:28 PM

## 2014-05-11 NOTE — Assessment & Plan Note (Signed)
This is chronic in nature, likely due to morbid obesity. She also has chronic hydradenitis. I will observe for now

## 2014-05-11 NOTE — Telephone Encounter (Signed)
added appts for jan 2016. pt given appt schedule while in inf.

## 2014-05-11 NOTE — Assessment & Plan Note (Signed)
Her iron studies are hard to interpret. Everything is consistent with iron deficiency anemia except for elevated ferritin which I think is inappropriately due to acute phase reactant. The most likely cause of her anemia is due to chronic blood loss. We discussed some of the risks, benefits, and alternatives of intravenous iron infusions. The patient is symptomatic from anemia and the iron level is critically low. She tolerated oral iron supplement poorly and desires to achieved higher levels of iron faster for adequate hematopoesis. Some of the side-effects to be expected including risks of infusion reactions, phlebitis, headaches, nausea and fatigue.  The patient is willing to proceed. Patient education material was dispensed.

## 2014-05-11 NOTE — Assessment & Plan Note (Signed)
She has infection underneath the breast and under her armpit. I will recommend increase hygiene. If this is persistent, she may need to general surgery consultation for incision and drainage

## 2014-05-11 NOTE — Progress Notes (Signed)
Pt information handout given for faraheme.  All questions answered to patient satisfaction. Will receive flu vaccine today at [facility]

## 2014-05-11 NOTE — Patient Instructions (Signed)

## 2014-06-16 ENCOUNTER — Other Ambulatory Visit: Payer: Self-pay | Admitting: Hematology and Oncology

## 2014-06-30 ENCOUNTER — Ambulatory Visit: Payer: 59 | Admitting: Dietician

## 2014-08-10 ENCOUNTER — Ambulatory Visit: Payer: 59 | Admitting: Dietician

## 2014-08-12 ENCOUNTER — Other Ambulatory Visit: Payer: 59

## 2014-08-19 ENCOUNTER — Telehealth: Payer: Self-pay | Admitting: Hematology and Oncology

## 2014-08-19 ENCOUNTER — Ambulatory Visit (INDEPENDENT_AMBULATORY_CARE_PROVIDER_SITE_OTHER): Payer: 59 | Admitting: Family Medicine

## 2014-08-19 ENCOUNTER — Telehealth: Payer: Self-pay | Admitting: *Deleted

## 2014-08-19 ENCOUNTER — Ambulatory Visit: Payer: 59 | Admitting: Hematology and Oncology

## 2014-08-19 VITALS — BP 130/84 | HR 127 | Temp 102.9°F | Resp 18 | Ht 61.0 in | Wt 342.0 lb

## 2014-08-19 DIAGNOSIS — R05 Cough: Secondary | ICD-10-CM

## 2014-08-19 DIAGNOSIS — R519 Headache, unspecified: Secondary | ICD-10-CM

## 2014-08-19 DIAGNOSIS — R51 Headache: Secondary | ICD-10-CM

## 2014-08-19 DIAGNOSIS — R509 Fever, unspecified: Secondary | ICD-10-CM

## 2014-08-19 DIAGNOSIS — R6889 Other general symptoms and signs: Secondary | ICD-10-CM

## 2014-08-19 DIAGNOSIS — B349 Viral infection, unspecified: Secondary | ICD-10-CM

## 2014-08-19 DIAGNOSIS — R059 Cough, unspecified: Secondary | ICD-10-CM

## 2014-08-19 LAB — POCT INFLUENZA A/B
INFLUENZA A, POC: NEGATIVE
INFLUENZA B, POC: NEGATIVE

## 2014-08-19 MED ORDER — HYDROCODONE-HOMATROPINE 5-1.5 MG/5ML PO SYRP: 5.0000 mL | ORAL_SOLUTION | ORAL | Status: DC | PRN

## 2014-08-19 MED ORDER — IBUPROFEN 200 MG PO TABS
600.0000 mg | ORAL_TABLET | Freq: Once | ORAL | Status: AC
Start: 2014-08-19 — End: 2014-08-19
  Administered 2014-08-19: 600 mg via ORAL

## 2014-08-19 MED ORDER — BUTALBITAL-APAP-CAFFEINE 50-325-40 MG PO TABS: 1.0000 | ORAL_TABLET | Freq: Four times a day (QID) | ORAL | Status: DC | PRN

## 2014-08-19 NOTE — Telephone Encounter (Signed)
Pt states she missed appt due to being sick- thinks she has the flu-going to [facility]

## 2014-08-19 NOTE — Progress Notes (Signed)
Subjective: 29 year old lady who is here complaining of being sick for about 2 days. She has been achy. She is coughing. She has a sore throat. She's been running a fever over 102. She does not smoke. 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 She has a roommate who was diagnosed yesterday as having flu. Diabetic but her A1c last time was down to 6.1.  Objective: Patient appears ill. Very warm to touch. Morbidly obese. TMs normal. Throat painful but not erythematous. Neck supple without significant nodes. Chest is clear to auscultation. Heart tachycardic.  Assessment: Flulike illness with fever   Plan: Flu swab  Results for orders placed or performed in visit on 08/19/14  POCT Influenza A/B  Result Value Ref Range   Influenza A, POC Negative    Influenza B, POC Negative    Flulike illness with myalgia and headache. Will treat the cough and treat the headache. Advised her to get rest and drink plenty of fluids. Return if worse.

## 2014-08-19 NOTE — Patient Instructions (Signed)
Drink plenty of fluids and get enough rest  Take Fioricet 1 or 2 pills every 6 hours if needed for bad headache  Take Hycodan (hydrocodone cough syrup) 1 teaspoon every 4-6 hours as needed for cough  Stay off work for the next 2 days to avoid transmitting illness to the patient and to allow you to recover  Return if needed or call back if not improving.

## 2014-08-19 NOTE — Telephone Encounter (Signed)
PT will call back to r/s missed appt she has to verify her work sch.

## 2014-08-23 ENCOUNTER — Encounter (HOSPITAL_COMMUNITY): Payer: Self-pay | Admitting: Emergency Medicine

## 2014-08-23 ENCOUNTER — Inpatient Hospital Stay (HOSPITAL_COMMUNITY): Payer: 59

## 2014-08-23 ENCOUNTER — Emergency Department (HOSPITAL_COMMUNITY): Admission: EM | Admit: 2014-08-23 | Discharge: 2014-08-23 | Discharge status: Absent without leave | Payer: Self-pay

## 2014-08-23 ENCOUNTER — Inpatient Hospital Stay (HOSPITAL_COMMUNITY)
Admission: EM | Admit: 2014-08-23 | Discharge: 2014-08-25 | DRG: 194 | Disposition: A | Discharge status: Home / Self Care | Payer: 59 | Attending: Family Medicine | Admitting: Family Medicine

## 2014-08-23 ENCOUNTER — Emergency Department (HOSPITAL_COMMUNITY): Payer: 59

## 2014-08-23 DIAGNOSIS — E039 Hypothyroidism, unspecified: Secondary | ICD-10-CM | POA: Diagnosis present

## 2014-08-23 DIAGNOSIS — E119 Type 2 diabetes mellitus without complications: Secondary | ICD-10-CM | POA: Diagnosis present

## 2014-08-23 DIAGNOSIS — D509 Iron deficiency anemia, unspecified: Secondary | ICD-10-CM | POA: Diagnosis present

## 2014-08-23 DIAGNOSIS — R0902 Hypoxemia: Secondary | ICD-10-CM | POA: Diagnosis present

## 2014-08-23 DIAGNOSIS — R059 Cough, unspecified: Secondary | ICD-10-CM

## 2014-08-23 DIAGNOSIS — I10 Essential (primary) hypertension: Secondary | ICD-10-CM | POA: Diagnosis present

## 2014-08-23 DIAGNOSIS — Y95 Nosocomial condition: Secondary | ICD-10-CM | POA: Diagnosis present

## 2014-08-23 DIAGNOSIS — Z833 Family history of diabetes mellitus: Secondary | ICD-10-CM | POA: Diagnosis not present

## 2014-08-23 DIAGNOSIS — J189 Pneumonia, unspecified organism: Principal | ICD-10-CM | POA: Diagnosis present

## 2014-08-23 DIAGNOSIS — R0602 Shortness of breath: Secondary | ICD-10-CM | POA: Diagnosis present

## 2014-08-23 DIAGNOSIS — E669 Obesity, unspecified: Secondary | ICD-10-CM | POA: Diagnosis present

## 2014-08-23 DIAGNOSIS — Z79899 Other long term (current) drug therapy: Secondary | ICD-10-CM | POA: Diagnosis not present

## 2014-08-23 DIAGNOSIS — Z6841 Body Mass Index (BMI) 40.0 and over, adult: Secondary | ICD-10-CM | POA: Diagnosis not present

## 2014-08-23 DIAGNOSIS — R000 Tachycardia, unspecified: Secondary | ICD-10-CM | POA: Diagnosis present

## 2014-08-23 DIAGNOSIS — Z8249 Family history of ischemic heart disease and other diseases of the circulatory system: Secondary | ICD-10-CM | POA: Diagnosis not present

## 2014-08-23 DIAGNOSIS — R05 Cough: Secondary | ICD-10-CM

## 2014-08-23 DIAGNOSIS — D72829 Elevated white blood cell count, unspecified: Secondary | ICD-10-CM | POA: Diagnosis present

## 2014-08-23 HISTORY — DX: Hypothyroidism, unspecified: E03.9

## 2014-08-23 HISTORY — DX: Type 2 diabetes mellitus without complications: E11.9

## 2014-08-23 LAB — CBC
HCT: 38.4 % (ref 36.0–46.0)
HEMOGLOBIN: 12 g/dL (ref 12.0–15.0)
MCH: 24.4 pg — ABNORMAL LOW (ref 26.0–34.0)
MCHC: 31.3 g/dL (ref 30.0–36.0)
MCV: 78 fL (ref 78.0–100.0)
Platelets: 372 10*3/uL (ref 150–400)
RBC: 4.92 MIL/uL (ref 3.87–5.11)
RDW: 13.5 % (ref 11.5–15.5)
WBC: 11.7 10*3/uL — ABNORMAL HIGH (ref 4.0–10.5)

## 2014-08-23 LAB — CBC WITH DIFFERENTIAL/PLATELET
BASOS ABS: 0 10*3/uL (ref 0.0–0.1)
Basophils Relative: 0 % (ref 0–1)
Eosinophils Absolute: 0 10*3/uL (ref 0.0–0.7)
Eosinophils Relative: 0 % (ref 0–5)
HCT: 40.4 % (ref 36.0–46.0)
HEMOGLOBIN: 12.4 g/dL (ref 12.0–15.0)
LYMPHS ABS: 3.3 10*3/uL (ref 0.7–4.0)
Lymphocytes Relative: 27 % (ref 12–46)
MCH: 23.9 pg — AB (ref 26.0–34.0)
MCHC: 30.7 g/dL (ref 30.0–36.0)
MCV: 78 fL (ref 78.0–100.0)
Monocytes Absolute: 1 10*3/uL (ref 0.1–1.0)
Monocytes Relative: 8 % (ref 3–12)
NEUTROS PCT: 65 % (ref 43–77)
Neutro Abs: 7.8 10*3/uL — ABNORMAL HIGH (ref 1.7–7.7)
Platelets: 419 10*3/uL — ABNORMAL HIGH (ref 150–400)
RBC: 5.18 MIL/uL — AB (ref 3.87–5.11)
RDW: 13.4 % (ref 11.5–15.5)
WBC: 12.2 10*3/uL — ABNORMAL HIGH (ref 4.0–10.5)

## 2014-08-23 LAB — GLUCOSE, CAPILLARY
GLUCOSE-CAPILLARY: 256 mg/dL — AB (ref 70–99)
GLUCOSE-CAPILLARY: 198 mg/dL — AB (ref 70–99)

## 2014-08-23 LAB — BASIC METABOLIC PANEL
Anion gap: 8 (ref 5–15)
BUN: 11 mg/dL (ref 6–23)
CHLORIDE: 98 meq/L (ref 96–112)
CO2: 29 mmol/L (ref 19–32)
CREATININE: 0.59 mg/dL (ref 0.50–1.10)
Calcium: 8.7 mg/dL (ref 8.4–10.5)
GFR calc non Af Amer: >90 mL/min (ref >90)
Glucose, Bld: 148 mg/dL — ABNORMAL HIGH (ref 70–99)
POTASSIUM: 3.6 mmol/L (ref 3.5–5.1)
Sodium: 135 mmol/L (ref 135–145)

## 2014-08-23 LAB — CREATININE, SERUM
CREATININE: 0.49 mg/dL — AB (ref 0.50–1.10)
GFR calc Af Amer: >90 mL/min (ref >90)

## 2014-08-23 LAB — STREP PNEUMONIAE URINARY ANTIGEN: STREP PNEUMO URINARY ANTIGEN: NEGATIVE

## 2014-08-23 LAB — HEMOGLOBIN A1C
Hgb A1c MFr Bld: 6.5 % — ABNORMAL HIGH (ref <5.7)
Mean Plasma Glucose: 140 mg/dL — ABNORMAL HIGH (ref <117)

## 2014-08-23 LAB — I-STAT BETA HCG BLOOD, ED (MC, WL, AP ONLY): I-stat hCG, quantitative: <5 m[IU]/mL (ref <5)

## 2014-08-23 LAB — MAGNESIUM: Magnesium: 2 mg/dL (ref 1.5–2.5)

## 2014-08-23 LAB — TSH: TSH: 1.128 u[IU]/mL (ref 0.350–4.500)

## 2014-08-23 MED ORDER — VANCOMYCIN HCL 10 G IV SOLR
1500.0000 mg | Freq: Three times a day (TID) | INTRAVENOUS | Status: DC
  Administered 2014-08-24 – 2014-08-25 (×4): 1500 mg via INTRAVENOUS
  Filled 2014-08-23 (×5): qty 1500

## 2014-08-23 MED ORDER — ENOXAPARIN SODIUM 40 MG/0.4ML ~~LOC~~ SOLN
40.0000 mg | SUBCUTANEOUS | Status: DC
  Filled 2014-08-23 (×3): qty 0.4

## 2014-08-23 MED ORDER — PANTOPRAZOLE SODIUM 40 MG PO TBEC
40.0000 mg | DELAYED_RELEASE_TABLET | Freq: Every day | ORAL | Status: DC
  Administered 2014-08-24 – 2014-08-25 (×2): 40 mg via ORAL
  Filled 2014-08-23 (×3): qty 1

## 2014-08-23 MED ORDER — SODIUM CHLORIDE 0.9 % IV BOLUS (SEPSIS)
1000.0000 mL | Freq: Once | INTRAVENOUS | Status: AC
  Administered 2014-08-23: 1000 mL via INTRAVENOUS

## 2014-08-23 MED ORDER — LEVOTHYROXINE SODIUM 100 MCG PO TABS
100.0000 ug | ORAL_TABLET | Freq: Every day | ORAL | Status: DC
  Administered 2014-08-24 – 2014-08-25 (×2): 100 ug via ORAL
  Filled 2014-08-23 (×2): qty 1

## 2014-08-23 MED ORDER — LEVALBUTEROL HCL 0.63 MG/3ML IN NEBU
0.6300 mg | INHALATION_SOLUTION | Freq: Four times a day (QID) | RESPIRATORY_TRACT | Status: DC
  Administered 2014-08-23 – 2014-08-25 (×9): 0.63 mg via RESPIRATORY_TRACT
  Filled 2014-08-23 (×8): qty 3

## 2014-08-23 MED ORDER — LEVALBUTEROL HCL 0.63 MG/3ML IN NEBU
0.6300 mg | INHALATION_SOLUTION | RESPIRATORY_TRACT | Status: DC | PRN
  Filled 2014-08-23: qty 3

## 2014-08-23 MED ORDER — BUTALBITAL-APAP-CAFFEINE 50-325-40 MG PO TABS: 1.0000 | ORAL_TABLET | Freq: Four times a day (QID) | ORAL | Status: DC | PRN

## 2014-08-23 MED ORDER — METHYLPREDNISOLONE SODIUM SUCC 125 MG IJ SOLR
125.0000 mg | Freq: Once | INTRAMUSCULAR | Status: AC
  Administered 2014-08-23: 125 mg via INTRAVENOUS
  Filled 2014-08-23: qty 2

## 2014-08-23 MED ORDER — DEXTROSE 5 % IV SOLN
1.0000 g | INTRAVENOUS | Status: DC
  Filled 2014-08-23: qty 10

## 2014-08-23 MED ORDER — VITAMIN D (ERGOCALCIFEROL) 1.25 MG (50000 UNIT) PO CAPS
50000.0000 [IU] | ORAL_CAPSULE | ORAL | Status: DC
  Administered 2014-08-25: 50000 [IU] via ORAL
  Filled 2014-08-23: qty 1

## 2014-08-23 MED ORDER — LEVOFLOXACIN IN D5W 750 MG/150ML IV SOLN
750.0000 mg | INTRAVENOUS | Status: DC
  Filled 2014-08-23: qty 150

## 2014-08-23 MED ORDER — HYDROCODONE-HOMATROPINE 5-1.5 MG/5ML PO SYRP: 5.0000 mL | ORAL_SOLUTION | ORAL | Status: DC | PRN

## 2014-08-23 MED ORDER — INSULIN ASPART 100 UNIT/ML ~~LOC~~ SOLN
0.0000 [IU] | Freq: Three times a day (TID) | SUBCUTANEOUS | Status: DC
  Administered 2014-08-23: 4 [IU] via SUBCUTANEOUS
  Administered 2014-08-23: 11 [IU] via SUBCUTANEOUS
  Administered 2014-08-24: 4 [IU] via SUBCUTANEOUS
  Administered 2014-08-24: 3 [IU] via SUBCUTANEOUS
  Administered 2014-08-24 – 2014-08-25 (×2): 7 [IU] via SUBCUTANEOUS

## 2014-08-23 MED ORDER — ALBUTEROL (5 MG/ML) CONTINUOUS INHALATION SOLN
10.0000 mg | INHALATION_SOLUTION | RESPIRATORY_TRACT | Status: AC
  Administered 2014-08-23: 10 mg via RESPIRATORY_TRACT
  Filled 2014-08-23: qty 20

## 2014-08-23 MED ORDER — MAGNESIUM SULFATE 2 GM/50ML IV SOLN
2.0000 g | INTRAVENOUS | Status: AC
  Administered 2014-08-23: 2 g via INTRAVENOUS
  Filled 2014-08-23: qty 50

## 2014-08-23 MED ORDER — CEFTRIAXONE SODIUM IN DEXTROSE 40 MG/ML IV SOLN
2.0000 g | Freq: Every day | INTRAVENOUS | Status: DC
  Administered 2014-08-23: 2 g via INTRAVENOUS
  Filled 2014-08-23: qty 50

## 2014-08-23 MED ORDER — VANCOMYCIN HCL 10 G IV SOLR
2500.0000 mg | INTRAVENOUS | Status: AC
  Administered 2014-08-23: 2500 mg via INTRAVENOUS
  Filled 2014-08-23: qty 2500

## 2014-08-23 MED ORDER — LEVOFLOXACIN 750 MG PO TABS
750.0000 mg | ORAL_TABLET | Freq: Once | ORAL | Status: AC
  Administered 2014-08-23: 750 mg via ORAL
  Filled 2014-08-23: qty 1

## 2014-08-23 MED ORDER — DEXTROSE 5 % IV SOLN
500.0000 mg | INTRAVENOUS | Status: DC
  Administered 2014-08-23: 500 mg via INTRAVENOUS
  Filled 2014-08-23: qty 500

## 2014-08-23 MED ORDER — PREDNISONE 10 MG PO TABS
60.0000 mg | ORAL_TABLET | Freq: Two times a day (BID) | ORAL | Status: DC
  Administered 2014-08-23 – 2014-08-24 (×2): 60 mg via ORAL
  Filled 2014-08-23 (×3): qty 1

## 2014-08-23 MED ORDER — ONDANSETRON HCL 4 MG/2ML IJ SOLN
4.0000 mg | Freq: Once | INTRAMUSCULAR | Status: AC
  Administered 2014-08-23: 4 mg via INTRAVENOUS
  Filled 2014-08-23: qty 2

## 2014-08-23 MED ORDER — ALBUTEROL SULFATE (2.5 MG/3ML) 0.083% IN NEBU
5.0000 mg | INHALATION_SOLUTION | Freq: Once | RESPIRATORY_TRACT | Status: AC
  Administered 2014-08-23: 5 mg via RESPIRATORY_TRACT
  Filled 2014-08-23: qty 6

## 2014-08-23 MED ORDER — ONDANSETRON HCL 4 MG/2ML IJ SOLN: 4.0000 mg | Freq: Once | INTRAMUSCULAR | Status: DC

## 2014-08-23 MED ORDER — DM-GUAIFENESIN ER 30-600 MG PO TB12
2.0000 | ORAL_TABLET | Freq: Two times a day (BID) | ORAL | Status: DC
  Administered 2014-08-23 – 2014-08-25 (×5): 2 via ORAL
  Filled 2014-08-23 (×5): qty 2

## 2014-08-23 MED ORDER — LEVOFLOXACIN IN D5W 750 MG/150ML IV SOLN
750.0000 mg | INTRAVENOUS | Status: DC
  Administered 2014-08-24 – 2014-08-25 (×2): 750 mg via INTRAVENOUS
  Filled 2014-08-23 (×2): qty 150

## 2014-08-23 MED ORDER — IOHEXOL 300 MG/ML  SOLN
100.0000 mL | Freq: Once | INTRAMUSCULAR | Status: AC | PRN
  Administered 2014-08-23: 100 mL via INTRAVENOUS

## 2014-08-23 MED ORDER — SODIUM CHLORIDE 0.9 % IV SOLN
INTRAVENOUS | Status: DC
  Administered 2014-08-23: 20:00:00 via INTRAVENOUS
  Administered 2014-08-23: 125 mL/h via INTRAVENOUS

## 2014-08-23 MED ORDER — IPRATROPIUM BROMIDE 0.02 % IN SOLN
0.5000 mg | Freq: Once | RESPIRATORY_TRACT | Status: AC
  Administered 2014-08-23: 0.5 mg via RESPIRATORY_TRACT
  Filled 2014-08-23: qty 2.5

## 2014-08-23 NOTE — ED Notes (Signed)
Awake. Verbally responsive. A/O x4. Resp even and unlabored. No audible adventitious breath sounds noted. ABC's intact. Family at bedside. 

## 2014-08-23 NOTE — Progress Notes (Signed)
UR completed 

## 2014-08-23 NOTE — Progress Notes (Signed)
RT attempted abg x2 but unsuccessful.  Pt refuses any more attempts at this time.  MD notified.

## 2014-08-23 NOTE — H&P (Addendum)
[facility] History and Physical  Lorraine Chambers YSH:[id number] DOB: 1985/09/15 DOA: 08/23/2014  Referring physician: Dr Wilson Singer PCP: Gerrit Heck, MD   Chief Complaint: Hypoxia/cough  HPI: Lorraine Chambers is a 29 y.o. female  With history of hypertension, diabetes mellitus, hypothyroidism, iron deficiency anemia who presented to the ED with a 8 day history of a productive cough of greenish yellowish sputum, fevers with times as high as 102.9, myalgias, chills, nausea, pleuritic chest pain associated with cough, wheezing, shortness of breath. Patient stated that 5 days prior to admission when she got off work early in the morning when home went to bed was not feeling well workup around 7:30 AM had some shortness of breath. Patient stated that her roommate was also noted to have the flu. Patient stated that she noted to have a fever to some ibuprofen and went to sleep. 4 days prior to admission patient presented to the urgent care center which was noted to have a temperature 102.9 a pulse of 127 and 92% on room air. Patient stated that she was checked for the flu it was negative she was given some cough medications and sent home. Patient stated that one day prior to admission she went to work and while at work started to feel clammy check a vitals temperature was 100.6 with a pulse of 137. Patient was noted to be short of breath and subsequently presented to the ED. Patient denies any recent surgeries, no recent long car rides or travel. Patient does endorse some nausea denies any emesis, no abdominal pain, no melena, no hematemesis, no hematochezia, no dysuria, no diarrhea, no constipation, no weakness. When patient presented to the ED she was noted to have a sats of 71%. Patient was placed on continuous nebulizer treatments and sats improved to 87% on room air. Chest x-ray which was done was negative for an infiltrate. Due to patient's hypoxia, [facility] were called to  admit the patient for further evaluation and management.   Review of Systems: As per history of present illness otherwise negative. Constitutional:  No weight loss, night sweats, Fevers, chills, fatigue.  HEENT:  No headaches, Difficulty swallowing,Tooth/dental problems,Sore throat,  No sneezing, itching, ear ache, nasal congestion, post nasal drip,  Cardio-vascular:  No chest pain, Orthopnea, PND, swelling in lower extremities, anasarca, dizziness, palpitations  GI:  No heartburn, indigestion, abdominal pain, nausea, vomiting, diarrhea, change in bowel habits, loss of appetite  Resp:  No shortness of breath with exertion or at rest. No excess mucus, no productive cough, No non-productive cough, No coughing up of blood.No change in color of mucus.No wheezing.No chest wall deformity  Skin:  no rash or lesions.  GU:  no dysuria, change in color of urine, no urgency or frequency. No flank pain.  Musculoskeletal:  No joint pain or swelling. No decreased range of motion. No back pain.  Psych:  No change in mood or affect. No depression or anxiety. No memory loss.   Past Medical History  Diagnosis Date  . Arthritis   . Hypertension     Pt reports episode of HTN in 2009  . Thyroid disease   . Hydradenitis 02/09/2014  . Iron deficiency anemia, unspecified 02/09/2014  . Diabetes mellitus without complication   . Hypothyroidism 08/23/2014  . DM (diabetes mellitus), type 2 08/23/2014   Past Surgical History  Procedure Laterality Date  . Wisdom tooth extraction     Social History:  reports that she has never smoked. She has never used smokeless  tobacco. She reports that she drinks alcohol. She reports that she does not use illicit drugs.  No Known Allergies  Family History  Problem Relation Age of Onset  . Stroke Other   . Heart failure Other   . Heart disease Mother   . Diabetes Mother   . Hypertension Mother   . Cancer Maternal Uncle     brain ca  . Diabetes Father   . Arthritis  Father   . Other Father      Prior to Admission medications   Medication Sig Start Date End Date Taking? Authorizing Provider  butalbital-acetaminophen-caffeine (FIORICET) 50-325-40 MG per tablet Take 1-2 tablets by mouth every 6 (six) hours as needed for headache. 08/19/14 08/19/15 Yes Posey Boyer, MD  ergocalciferol (VITAMIN D2) 50000 UNITS capsule Take 50,000 Units by mouth every Wednesday.   Yes Historical Provider, MD  HYDROcodone-homatropine (HYCODAN) 5-1.5 MG/5ML syrup Take 5 mLs by mouth every 4 (four) hours as needed. Patient taking differently: Take 5 mLs by mouth every 4 (four) hours as needed for cough.  08/19/14  Yes Posey Boyer, MD  Levonorgestrel (SKYLA) 92.1 MG IUD 1 application by Intrauterine route once.    Yes Historical Provider, MD  levothyroxine (SYNTHROID, LEVOTHROID) 100 MCG tablet Take 100 mcg by mouth daily before breakfast.   Yes Historical Provider, MD  metFORMIN (GLUCOPHAGE) 500 MG tablet Take 500 mg by mouth 2 (two) times daily with a meal.    Yes Historical Provider, MD   Physical Exam: Filed Vitals:   08/23/14 0951 08/23/14 1053 08/23/14 1513 08/23/14 1625  BP: 152/90   135/82  Pulse: 107   87  Temp: 98.3 F (36.8 C)   97.7 F (36.5 C)  TempSrc: Oral   Oral  Resp: 18   18  Height: 5\' 1"  (1.549 m)     Weight: 154.722 kg (341 lb 1.6 oz)     SpO2: 93% 92% 93% 93%    Wt Readings from Last 3 Encounters:  08/23/14 154.722 kg (341 lb 1.6 oz)  08/19/14 155.13 kg (342 lb)  05/11/14 155.629 kg (343 lb 1.6 oz)    General:  Well-developed well-nourished obese in no acute cardiopulmonary distress.  Eyes: PERRLA, EOMI, normal lids, irises & conjunctiva ENT: grossly normal hearing, lips & tongue Neck: no LAD, masses or thyromegaly Cardiovascular: Tachycardic, no m/r/g. No LE edema. Respiratory: Some coarse breath sounds noted on the left, minimal expiratory wheezing, no crackles. Normal respiratory effort. Abdomen: soft, ntnd, positive bowel sounds, Skin:  no rash or induration seen on limited exam Musculoskeletal: grossly normal tone BUE/BLE Psychiatric: grossly normal mood and affect, speech fluent and appropriate Neurologic: Alert and oriented 3. Cranial nerves II through XII are grossly intact. No focal deficits.           Labs on Admission:  Basic Metabolic Panel:  Recent Labs Lab 08/23/14 0245 08/23/14 1005  NA 135  --   K 3.6  --   CL 98  --   CO2 29  --   GLUCOSE 148*  --   BUN 11  --   CREATININE 0.59 0.49*  CALCIUM 8.7  --   MG  --  2.0   Liver Function Tests: No results for input(s): AST, ALT, ALKPHOS, BILITOT, PROT, ALBUMIN in the last 168 hours. No results for input(s): LIPASE, AMYLASE in the last 168 hours. No results for input(s): AMMONIA in the last 168 hours. CBC:  Recent Labs Lab 08/23/14 0245 08/23/14 1005  WBC 12.2*  11.7*  NEUTROABS 7.8*  --   HGB 12.4 12.0  HCT 40.4 38.4  MCV 78.0 78.0  PLT 419* 372   Cardiac Enzymes: No results for input(s): CKTOTAL, CKMB, CKMBINDEX, TROPONINI in the last 168 hours.  BNP (last 3 results) No results for input(s): PROBNP in the last 8760 hours. CBG:  Recent Labs Lab 08/23/14 1144 08/23/14 1626  GLUCAP 256* 198*    Radiological Exams on Admission: Dg Chest 2 View  08/23/2014   CLINICAL DATA:  Shortness of breath, cough, congestion and chest pain for 1 week.  EXAM: CHEST  2 VIEW  COMPARISON:  Chest radiograph March 30, 2010  FINDINGS: Cardiomediastinal silhouette is unremarkable. Strandy densities LEFT lung base. The lungs are otherwise clear without pleural effusions or focal consolidations. Trachea projects midline and there is no pneumothorax. Soft tissue planes and included osseous structures are non-suspicious. Large body habitus.  IMPRESSION: Strandy densities LEFT lung base favor atelectasis on this habitus limited examination.   Electronically Signed   By: Elon Alas   On: 08/23/2014 02:04   Ct Chest W Contrast  08/23/2014   CLINICAL DATA:   Fever and wheezing ; pain  EXAM: CT CHEST WITH CONTRAST  TECHNIQUE: Multidetector CT imaging of the chest was performed during intravenous contrast administration.  CONTRAST:  121mL OMNIPAQUE IOHEXOL 300 MG/ML  SOLN  COMPARISON:  Chest radiograph August 23, 2014; chest CT March 31, 2010  FINDINGS: There is airspace consolidation in the posterior segment of the left upper lobe as well as in the anterior and lateral segments of the left lower lobe. There is mild atelectatic change in the lingula. Right lung is clear.  Heart is mildly enlarged.  The pericardium is not thickened.  There are multiple small mediastinal lymph nodes. There is no frank adenopathy by size criteria.  In the visualized upper abdomen, there is hepatic steatosis.  There are no blastic or lytic bone lesions.  Thyroid appears normal.  There is no thoracic aortic aneurysm. No major vessel pulmonary embolus is appreciable.  IMPRESSION: Airspace disease consistent with pneumonia in the posterior segment of the left upper lobe as well as in portions of the left lower lobe. Right lung clear. Heart mildly enlarged. No adenopathy. Hepatic steatosis present.   Electronically Signed   By: Lowella Grip M.D.   On: 08/23/2014 09:50    EKG: Independently reviewed. Sinus tachycardia  Assessment/Plan Principal Problem:   HCAP (healthcare-associated pneumonia) Active Problems:   Hypoxia   Obesity   HYPERTENSION, BENIGN ESSENTIAL   Iron deficiency anemia   Leukocytosis   Hypothyroidism   Tachycardia   DM (diabetes mellitus), type 2  #1 presumed probable Health care-acquired pneumonia Patient is presenting with a productive cough, fevers, chills, hypoxia clinically consistent with a community-acquired pneumonia. Chest x-ray is negative for any acute infiltrate. 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 Will treat as a healthcare associated pneumonia. Will check a sputum Gram stain and culture. Check  urine Legionella antigen. Check a urine pneumococcus antigen. Check a CT of the chest. Will place empirically on IV vancomycin and IV Levaquin. Mucinex. Nebulizer treatments. Oxygen. Follow.  #2 hypoxia Likely secondary to problem #1. Will check a CT of the chest. Place on oxygen. See #1. Follow.  #3 hypertension Stable. Follow.  #4 iron deficiency anemia H&H stable. Follow.  #5 leukocytosis Likely secondary to problem #1. Check a UA with cultures and sensitivities. Placed empirically on IV antibiotics of IV Rocephin and  azithromycin. Follow.  #6 hypothyroidism Check a TSH. Continue home dose Synthroid.  #7 sinus tachycardia Likely secondary to problem #1. Check a TSH. Place on IV fluids. Placed empirically on IV antibiotics. See problem #1.  #8 type 2 diabetes mellitus Check a hemoglobin A1c. Hold oral hypoglycemic agents. Will place on a sliding scale insulin.  #9 prophylaxis PPI for GI prophylaxis. Lovenox for DVT prophylaxis.  Code Status: Full DVT Prophylaxis: Lovenox Family Communication: Updated patient, boyfriend, brother at bedside. Disposition Plan: Admit to telemetry  Time spent: 65 mins  [facility] MD [facility] Pager [phone]

## 2014-08-23 NOTE — ED Notes (Signed)
Patient transported to CT 

## 2014-08-23 NOTE — ED Provider Notes (Signed)
Pt signed out at shift change by Antonietta Breach, PA-C.  Pt presented to ED with c/o cough, wheeze, and hot and cold chills.  Pt is being tx for pneumonia, first dose of levaquin 750mg  given in ED.  Pt has had a duobneb in ED.  Was hypoxic in the 80s after duoneb.  Pt is to receive a 1hr long continuous albuterol neb.  Plan is to get ambulatory O2.  If still hypoxic, pt will have to be admitted for continued management of suspected pneumonia.     Results for orders placed or performed during the hospital encounter of 08/23/14  CBC with Differential  Result Value Ref Range   WBC 12.2 (H) 4.0 - 10.5 K/uL   RBC 5.18 (H) 3.87 - 5.11 MIL/uL   Hemoglobin 12.4 12.0 - 15.0 g/dL   HCT 40.4 36.0 - 46.0 %   MCV 78.0 78.0 - 100.0 fL   MCH 23.9 (L) 26.0 - 34.0 pg   MCHC 30.7 30.0 - 36.0 g/dL   RDW 13.4 11.5 - 15.5 %   Platelets 419 (H) 150 - 400 K/uL   Neutrophils Relative % 65 43 - 77 %   Neutro Abs 7.8 (H) 1.7 - 7.7 K/uL   Lymphocytes Relative 27 12 - 46 %   Lymphs Abs 3.3 0.7 - 4.0 K/uL   Monocytes Relative 8 3 - 12 %   Monocytes Absolute 1.0 0.1 - 1.0 K/uL   Eosinophils Relative 0 0 - 5 %   Eosinophils Absolute 0.0 0.0 - 0.7 K/uL   Basophils Relative 0 0 - 1 %   Basophils Absolute 0.0 0.0 - 0.1 K/uL  Basic metabolic panel  Result Value Ref Range   Sodium 135 135 - 145 mmol/L   Potassium 3.6 3.5 - 5.1 mmol/L   Chloride 98 96 - 112 mEq/L   CO2 29 19 - 32 mmol/L   Glucose, Bld 148 (H) 70 - 99 mg/dL   BUN 11 6 - 23 mg/dL   Creatinine, Ser 0.59 0.50 - 1.10 mg/dL   Calcium 8.7 8.4 - 10.5 mg/dL   GFR calc non Af Amer >90 >90 mL/min   GFR calc Af Amer >90 >90 mL/min   Anion gap 8 5 - 15  I-Stat Beta hCG blood, ED ([facility], [facility], [facility] only)  Result Value Ref Range   I-stat hCG, quantitative <5.0 <5 mIU/mL   Comment 3           Dg Chest 2 View  08/23/2014   CLINICAL DATA:  Shortness of breath, cough, congestion and chest pain for 1 week.  EXAM: CHEST  2 VIEW  COMPARISON:  Chest radiograph March 30, 2010   FINDINGS: Cardiomediastinal silhouette is unremarkable. Strandy densities LEFT lung base. The lungs are otherwise clear without pleural effusions or focal consolidations. Trachea projects midline and there is no pneumothorax. Soft tissue planes and included osseous structures are non-suspicious. Large body habitus.  IMPRESSION: Strandy densities LEFT lung base favor atelectasis on this habitus limited examination.   Electronically Signed   By: Elon Alas   On: 08/23/2014 02:04    Physical Exam  BP 129/82 mmHg  Pulse 120  Temp(Src) 99.2 F (37.3 C) (Oral)  Resp 18  SpO2 95%  Physical Exam  Nursing note and vitals reviewed. Constitutional: morbidly obese female sitting on side of exam bed getting 1hr long continuous neb tx, NAD Eyes: EOM in tact Cardiovascular: RRR Pulmonary/Chest: no respiratory distress, diffuse expiratory wheeze, decreased breath sounds in lower lung  fields bilaterally Abdominal: soft, non-tender Musculoskeletal: FROM arms and legs Neurological: alert and oriented to person, place and time Skin: warm and dry    ED Course  Procedures  MDM 6:40 AM Pt still completing continuous neb. O2-99-100%.  Pt would like to be discharged home if possible.   7:55 AM Pt's O2 continues to drop into the 80s when on RA.  O2 of 87% on RA after continuous neb tx.  Pt's O2 dropped down to 81% while ambulating.  Pt was mildly SOB at that time.  Will call hospitalist to admit pt for continued tx of suspected pneumonia. Pt is agreeable with tx plan.    [facility]   8:23 AM Consulted with Dr. Grandville Silos, agreed to admit pt for further evaluation and treatment of SOB, hypoxia. Temporary orders for tele bed placed.          Camira Geidel, PA-C 08/23/14 0156  Julianne Rice, MD 08/26/14 [phone]

## 2014-08-23 NOTE — ED Notes (Signed)
Hospitalist at bedside 

## 2014-08-23 NOTE — ED Notes (Signed)
Awake. Verbally responsive. A/O x4. Resp even and unlabored. No audible adventitious breath sounds noted. ABC's intact. Pt ambulated in hallway with tech with sats monitoring. Family at bedside.

## 2014-08-23 NOTE — ED Notes (Signed)
Awake. Verbally responsive. A/O x4. Resp even and unlabored. No audible adventitious breath sounds noted. ABC's intact. IV infusing at 92ml/hr without difficulty. Family at bedside.

## 2014-08-23 NOTE — ED Provider Notes (Signed)
CSN: [id number]     Arrival date & time 08/23/14  0044 History   First MD Initiated Contact with Patient 08/23/14 0141     Chief Complaint  Patient presents with  . Cough  . Wheezing    (Consider location/radiation/quality/duration/timing/severity/associated sxs/prior Treatment) HPI Comments: 52 her old female presents to the emergency department for further evaluation of cough and wheezing. 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 She states that she began with a cough 1 week ago. Cough has been productive of yellow/green sputum. She states that cough worsened on 08/18/2014 at which time she noticed a wheeze while breathing. Patient went to urgent care the following day and was found to have a fever of 102.31F. She was told that she had a viral illness and to continue with supportive treatments. Patient states that she has been resting at home and attempted to go back to work yesterday. While working, she began to feel lightheaded and increasingly short of breath. She also endorses some mild diaphoresis and extreme fatigue. She states that she would see a few patients and need to rest to catch her breath. She states that she put herself on one of the monitors and noticed that she had an elevated heart rate, fever 100.25F, and that her oxygen level was 87% on room air. At this time, patient decided to come to the ED for evaluation. Patient states that she has had her flu shot this ear. She reports having a negative influenza screen at urgent care. No associated syncope, V/D, hemoptysis, or leg swelling.  Patient is a 29 y.o. female presenting with cough and wheezing. The history is provided by the patient. No language interpreter was used.  Cough Associated symptoms: fever and wheezing   Wheezing Associated symptoms: chest tightness, cough, fatigue and fever     Past Medical History  Diagnosis Date  . Arthritis   . Hypertension     Pt reports  episode of HTN in 2009  . Thyroid disease   . Hydradenitis 02/09/2014  . Iron deficiency anemia, unspecified 02/09/2014  . Diabetes mellitus without complication    Past Surgical History  Procedure Laterality Date  . Wisdom tooth extraction     Family History  Problem Relation Age of Onset  . Stroke Other   . Heart failure Other   . Heart disease Mother   . Diabetes Mother   . Hypertension Mother   . Cancer Maternal Uncle     brain ca   History  Substance Use Topics  . Smoking status: Never Smoker   . Smokeless tobacco: Never Used  . Alcohol Use: Yes     Comment: occassional   OB History    No data available      Review of Systems  Constitutional: Positive for fever and fatigue.  Respiratory: Positive for cough, chest tightness and wheezing.   Gastrointestinal: Negative for vomiting and diarrhea.  Neurological: Positive for light-headedness. Negative for syncope.  All other systems reviewed and are negative.   Allergies  Review of patient's allergies indicates no known allergies.  Home Medications   Prior to Admission medications   Medication Sig Start Date End Date Taking? Authorizing Provider  butalbital-acetaminophen-caffeine (FIORICET) 50-325-40 MG per tablet Take 1-2 tablets by mouth every 6 (six) hours as needed for headache. 08/19/14 08/19/15 Yes Posey Boyer, MD  ergocalciferol (VITAMIN D2) 50000 UNITS capsule Take 50,000 Units by mouth every Wednesday.   Yes Historical  Provider, MD  HYDROcodone-homatropine [facility]) 5-1.5 MG/5ML syrup Take 5 mLs by mouth every 4 (four) hours as needed. Patient taking differently: Take 5 mLs by mouth every 4 (four) hours as needed for cough.  08/19/14  Yes Posey Boyer, MD  Levonorgestrel (SKYLA) 75.6 MG IUD 1 application by Intrauterine route once.    Yes Historical Provider, MD  levothyroxine (SYNTHROID, LEVOTHROID) 100 MCG tablet Take 100 mcg by mouth daily before breakfast.   Yes Historical Provider, MD  metFORMIN  (GLUCOPHAGE) 500 MG tablet Take 500 mg by mouth 2 (two) times daily with a meal.    Yes Historical Provider, MD   BP 153/99 mmHg  Pulse 103  Temp(Src) 99.2 F (37.3 C) (Oral)  Resp 18  SpO2 97%   Physical Exam  Constitutional: She is oriented to person, place, and time. She appears well-developed and well-nourished. No distress.  Nonseptic appearing  HENT:  Head: Normocephalic and atraumatic.  Eyes: Conjunctivae and EOM are normal. Pupils are equal, round, and reactive to light. No scleral icterus.  Neck: Normal range of motion.  No nuchal rigidity or meningismus  Cardiovascular: Regular rhythm and normal heart sounds.  Tachycardia present.   Pulmonary/Chest: No respiratory distress. She has wheezes. She has no rales.  Mild dyspnea without tachypnea. Faint wheezing in RLL. No accessory muscle use. Sentences become slightly truncated with prolonged speech.  Abdominal:  Morbidly obese abdomen  Musculoskeletal: Normal range of motion.  Neurological: She is alert and oriented to person, place, and time. She exhibits normal muscle tone. Coordination normal.  GCS 15. Speech is goal oriented. Patient ambulatory with steady gait. She moves extremities without ataxia.  Skin: Skin is warm and dry. No rash noted. She is not diaphoretic. No erythema. No pallor.  Psychiatric: She has a normal mood and affect. Her behavior is normal.  Nursing note and vitals reviewed.   ED Course  Procedures (including critical care time) Labs Review Labs Reviewed  CBC WITH DIFFERENTIAL - Abnormal; Notable for the following:    WBC 12.2 (*)    RBC 5.18 (*)    MCH 23.9 (*)    Platelets 419 (*)    Neutro Abs 7.8 (*)    All other components within normal limits  BASIC METABOLIC PANEL - Abnormal; Notable for the following:    Glucose, Bld 148 (*)    All other components within normal limits  I-STAT BETA HCG BLOOD, ED ([facility], [facility], [facility] ONLY)    Imaging Review Dg Chest 2 View  08/23/2014   CLINICAL DATA:   Shortness of breath, cough, congestion and chest pain for 1 week.  EXAM: CHEST  2 VIEW  COMPARISON:  Chest radiograph March 30, 2010  FINDINGS: Cardiomediastinal silhouette is unremarkable. Strandy densities LEFT lung base. The lungs are otherwise clear without pleural effusions or focal consolidations. Trachea projects midline and there is no pneumothorax. Soft tissue planes and included osseous structures are non-suspicious. Large body habitus.  IMPRESSION: Strandy densities LEFT lung base favor atelectasis on this habitus limited examination.   Electronically Signed   By: Elon Alas   On: 08/23/2014 02:04     EKG Interpretation None      MDM   Final diagnoses:  Cough    29 year old female presents to the emergency department for further evaluation of persistent cough with fever and hypoxia. The chest x-ray does not suggest pneumonia, there is a high clinical suspicion for this. Patient reports a negative influenza screen when evaluated at urgent care on 08/19/2014.  Labs today are reassuring. Patient does have a leukocytosis, but has a history of chronic leukocytosis; labs appear at baseline today. Patient is noted to desaturate to 78% on room air with ambulation. Patient has been given a nebulizer treatment as well as IV steroids and IV fluids. She had some improvement in her breathing with this and ambulated with hypoxia to 84% on room air on recheck.   Patient to be given an hour-long nebulizer as well as magnesium sulfate. Will reattempt ambulation following completion of these treatments. If patient continues to be hypoxic, have discussed that she will likely require admission to the hospital today. If patient is able to ambulate without hypoxia and endorses feelings of improvement in her symptoms, anticipate discharge home with remaining 4 day course of Levaquin. Patient signed out to Noland Fordyce, PA-C who will follow up on patient and disposition appropriately.   Filed Vitals:    08/23/14 0054 08/23/14 0055 08/23/14 0342 08/23/14 0420  BP: 143/100  153/99   Pulse: 108  103   Temp: 99.2 F (37.3 C)     TempSrc: Oral     Resp: 18  18   SpO2: 88% 95% 99% 97%     Antonietta Breach, PA-C 08/23/14 0556  Julianne Rice, MD 08/23/14 [phone]

## 2014-08-23 NOTE — ED Notes (Addendum)
Patient transported to CT 

## 2014-08-23 NOTE — Progress Notes (Signed)
ANTIBIOTIC CONSULT NOTE - INITIAL  Pharmacy Consult for Vancomycin Indication: HCAP  No Known Allergies  Patient Measurements: Height: 5\' 1"  (154.9 cm) Weight: (!) 341 lb 1.6 oz (154.722 kg) IBW/kg (Calculated) : 47.8 Adjusted Body Weight:   Vital Signs: Temp: 97.7 F (36.5 C) (01/18 1625) Temp Source: Oral (01/18 1625) BP: 135/82 mmHg (01/18 1625) Pulse Rate: 87 (01/18 1625) Intake/Output from previous day:   Intake/Output from this shift:    Labs:  Recent Labs  08/23/14 0245 08/23/14 1005  WBC 12.2* 11.7*  HGB 12.4 12.0  PLT 419* 372  CREATININE 0.59 0.49*   Estimated Creatinine Clearance: 152.4 mL/min (by C-G formula based on Cr of 0.49). No results for input(s): VANCOTROUGH, VANCOPEAK, VANCORANDOM, GENTTROUGH, GENTPEAK, GENTRANDOM, TOBRATROUGH, TOBRAPEAK, TOBRARND, AMIKACINPEAK, AMIKACINTROU, AMIKACIN in the last 72 hours.   Microbiology: No results found for this or any previous visit (from the past 720 hour(s)).  Medical History: Past Medical History  Diagnosis Date  . Arthritis   . Hypertension     Pt reports episode of HTN in 2009  . Thyroid disease   . Hydradenitis 02/09/2014  . Iron deficiency anemia, unspecified 02/09/2014  . Diabetes mellitus without complication   . Hypothyroidism 08/23/2014  . 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  CXR neg.  CT chest shows PNA in portions of LUL and LLL.    1/18 >> Azithromycin >> 1/18 1/18 >> Ceftriaxone >> 1/18 1/18 >> Vancomycin  >> 1/18 >> Levaquin  >>    Tmax: 98.3 WBCs:Sl elevated  Renal:SCr 0.49, CrCl >100 ml/min  1/18 strep pneumo antigen: neg 1/18 legionella antigen: IP 1/18 sputum: ordered   Goal of Therapy:  Vancomycin trough level 15-20 mcg/ml  Plan:  Vancomycin 2500mg  x 1 now,  then 1500mg  IV q8h Levaquin dose ok F/u vancomycin trough at steady state F/u cultures, renal fxn, clinical course  Ralene Bathe, PharmD, BCPS 08/23/2014, 7:27 PM  Pager: [phone]

## 2014-08-23 NOTE — ED Notes (Signed)
Pt arrived to the ED with a complaint of a cough and wheezing.  Pt states the cough started a week ago.  Pt states by Wednesday she had developed a wheeze.  Pt went to urgent care where she had a fever that has persisted even though she was medicated for said.  Pt states thsi evening she felt light headed and diaphoretic and left work early to be evaluated.

## 2014-08-23 NOTE — ED Notes (Signed)
RN Ambulated pt around hall and to the restroom, pt O2 was 84. RN stated it did drop to 81.

## 2014-08-24 DIAGNOSIS — D509 Iron deficiency anemia, unspecified: Secondary | ICD-10-CM

## 2014-08-24 LAB — GLUCOSE, CAPILLARY
GLUCOSE-CAPILLARY: 258 mg/dL — AB (ref 70–99)
GLUCOSE-CAPILLARY: 144 mg/dL — AB (ref 70–99)
GLUCOSE-CAPILLARY: 223 mg/dL — AB (ref 70–99)
GLUCOSE-CAPILLARY: 179 mg/dL — AB (ref 70–99)
Glucose-Capillary: 176 mg/dL — ABNORMAL HIGH (ref 70–99)

## 2014-08-24 LAB — CBC WITH DIFFERENTIAL/PLATELET
Basophils Absolute: 0 10*3/uL (ref 0.0–0.1)
Basophils Relative: 0 % (ref 0–1)
Eosinophils Absolute: 0 10*3/uL (ref 0.0–0.7)
Eosinophils Relative: 0 % (ref 0–5)
HCT: 37.2 % (ref 36.0–46.0)
Hemoglobin: 11.1 g/dL — ABNORMAL LOW (ref 12.0–15.0)
LYMPHS ABS: 2.1 10*3/uL (ref 0.7–4.0)
Lymphocytes Relative: 17 % (ref 12–46)
MCH: 23.7 pg — ABNORMAL LOW (ref 26.0–34.0)
MCHC: 29.8 g/dL — ABNORMAL LOW (ref 30.0–36.0)
MCV: 79.5 fL (ref 78.0–100.0)
MONOS PCT: 6 % (ref 3–12)
Monocytes Absolute: 0.7 10*3/uL (ref 0.1–1.0)
NEUTROS ABS: 9.4 10*3/uL — AB (ref 1.7–7.7)
NEUTROS PCT: 77 % (ref 43–77)
Platelets: 383 10*3/uL (ref 150–400)
RBC: 4.68 MIL/uL (ref 3.87–5.11)
RDW: 13.6 % (ref 11.5–15.5)
WBC: 12.2 10*3/uL — AB (ref 4.0–10.5)

## 2014-08-24 LAB — BASIC METABOLIC PANEL
Anion gap: 7 (ref 5–15)
BUN: 11 mg/dL (ref 6–23)
CALCIUM: 8.9 mg/dL (ref 8.4–10.5)
CO2: 32 mmol/L (ref 19–32)
Chloride: 101 mEq/L (ref 96–112)
Creatinine, Ser: 0.49 mg/dL — ABNORMAL LOW (ref 0.50–1.10)
GFR calc Af Amer: >90 mL/min (ref >90)
Glucose, Bld: 241 mg/dL — ABNORMAL HIGH (ref 70–99)
Potassium: 4.3 mmol/L (ref 3.5–5.1)
Sodium: 140 mmol/L (ref 135–145)

## 2014-08-24 LAB — LEGIONELLA ANTIGEN, URINE

## 2014-08-24 LAB — HIV ANTIBODY (ROUTINE TESTING W REFLEX): HIV-1/HIV-2 Ab: NONREACTIVE

## 2014-08-24 MED ORDER — PREDNISONE 50 MG PO TABS
60.0000 mg | ORAL_TABLET | Freq: Every day | ORAL | Status: DC
  Administered 2014-08-25: 60 mg via ORAL
  Filled 2014-08-24: qty 1

## 2014-08-24 NOTE — Progress Notes (Signed)
[facility] PROGRESS NOTE  Lorraine Chambers [id number] DOB: Feb 05, 1986 DOA: 08/23/2014 PCP: Gerrit Heck, MD  Assessment/Plan: #1 healthcare associated pneumonia Patient presented with fevers, chills, productive cough, hypoxia. Patient works in the healthcare setting and a such is being treated as a healthcare associated pneumonia. CT chest which was done was consistent with a left upper lobe and left lower lobe pneumonia. Sputum Gram stain and cultures are pending. Urine Legionella antigen is pending. Urine pneumococcus antigen is pending. Continue empiric IV vancomycin IV Levaquin, Mucinex, oxygen, nebulizer treatments. Taper steroids. Follow.  #2 hypoxia Secondary to problem #1. Improving. See problem #1.  #3 hypertension Stable. Follow.  #4 Iron deficiency anemia Stable. Follow H&H.  #5 leukocytosis Active secondary to problem #1. Urinalysis is pending. Follow.  #6 hypothyroidism TSH within normal limits at 1.128. Continue home dose Synthroid.  #7 sinus tachycardia Likely secondary to problem #1. Resolved.  #8 well-controlled type 2 diabetes Oral hypoglycemic agents on hold. Hemoglobin A1c 6.5. Continue sliding scale insulin.  #9 prophylaxis Protonix for GI prophylaxis. Lovenox for DVT prophylaxis.   Code Status: Full Family Communication: Updated patient no family at bedside. Disposition Plan: Home when medically stable hopefully 1-2 days.   Consultants:  None  Procedures:  Chest x-ray 08/23/2014  CT chest 08/23/2014  Antibiotics:  IV Rocephin 08/23/2014>>>>> 08/23/2014  IV azithromycin 08/23/2014>>>> 08/23/2014  IV vancomycin 08/23/2014  IV Levaquin 08/23/2014  HPI/Subjective: Patient states she's feeling better. Cough is improved. Patient asking when she can go home. Patient wants to take a shower.  Objective: Filed Vitals:   08/24/14 1338  BP: 137/92  Pulse: 85  Temp: 98.3 F (36.8 C)  Resp: 18    Intake/Output  Summary (Last 24 hours) at 08/24/14 1759 Last data filed at 08/24/14 1518  Gross per 24 hour  Intake 2256.25 ml  Output   1650 ml  Net 606.25 ml   Filed Weights   08/23/14 0951  Weight: 154.722 kg (341 lb 1.6 oz)    Exam:   General:  NAD  Cardiovascular: RRR, no rubs murmurs or gallops. Distant heart sounds.  Respiratory: Clear to auscultation bilaterally. Minimal expiratory wheezing.  Abdomen: Soft, nontender, nondistended, positive bowel sounds.  Musculoskeletal: No clubbing cyanosis or edema  Data Reviewed: Basic Metabolic Panel:  Recent Labs Lab 08/23/14 0245 08/23/14 1005 08/24/14 0550  NA 135  --  140  K 3.6  --  4.3  CL 98  --  101  CO2 29  --  32  GLUCOSE 148*  --  241*  BUN 11  --  11  CREATININE 0.59 0.49* 0.49*  CALCIUM 8.7  --  8.9  MG  --  2.0  --    Liver Function Tests: No results for input(s): AST, ALT, ALKPHOS, BILITOT, PROT, ALBUMIN in the last 168 hours. No results for input(s): LIPASE, AMYLASE in the last 168 hours. No results for input(s): AMMONIA in the last 168 hours. CBC:  Recent Labs Lab 08/23/14 0245 08/23/14 1005 08/24/14 0550  WBC 12.2* 11.7* 12.2*  NEUTROABS 7.8*  --  9.4*  HGB 12.4 12.0 11.1*  HCT 40.4 38.4 37.2  MCV 78.0 78.0 79.5  PLT 419* 372 383   Cardiac Enzymes: No results for input(s): CKTOTAL, CKMB, CKMBINDEX, TROPONINI in the last 168 hours. BNP (last 3 results) No results for input(s): PROBNP in the last 8760 hours. CBG:  Recent Labs Lab 08/23/14 1626 08/23/14 2132 08/24/14 0724 08/24/14 1149 08/24/14 1710  GLUCAP 198* 258* 176* 144* 223*  No results found for this or any previous visit (from the past 240 hour(s)).   Studies: Dg Chest 2 View  08/23/2014   CLINICAL DATA:  Shortness of breath, cough, congestion and chest pain for 1 week.  EXAM: CHEST  2 VIEW  COMPARISON:  Chest radiograph March 30, 2010  FINDINGS: Cardiomediastinal silhouette is unremarkable. Strandy densities LEFT lung base. The  lungs are otherwise clear without pleural effusions or focal consolidations. Trachea projects midline and there is no pneumothorax. Soft tissue planes and included osseous structures are non-suspicious. Large body habitus.  IMPRESSION: Strandy densities LEFT lung base favor atelectasis on this habitus limited examination.   Electronically Signed   By: Elon Alas   On: 08/23/2014 02:04   Ct Chest W Contrast  08/23/2014   CLINICAL DATA:  Fever and wheezing ; pain  EXAM: CT CHEST WITH CONTRAST  TECHNIQUE: Multidetector CT imaging of the chest was performed during intravenous contrast administration.  CONTRAST:  121mL OMNIPAQUE IOHEXOL 300 MG/ML  SOLN  COMPARISON:  Chest radiograph August 23, 2014; chest CT March 31, 2010  FINDINGS: There is airspace consolidation in the posterior segment of the left upper lobe as well as in the anterior and lateral segments of the left lower lobe. There is mild atelectatic change in the lingula. Right lung is clear.  Heart is mildly enlarged.  The pericardium is not thickened.  There are multiple small mediastinal lymph nodes. There is no frank adenopathy by size criteria.  In the visualized upper abdomen, there is hepatic steatosis.  There are no blastic or lytic bone lesions.  Thyroid appears normal.  There is no thoracic aortic aneurysm. No major vessel pulmonary embolus is appreciable.  IMPRESSION: Airspace disease consistent with pneumonia in the posterior segment of the left upper lobe as well as in portions of the left lower lobe. Right lung clear. Heart mildly enlarged. No adenopathy. Hepatic steatosis present.   Electronically Signed   By: Lowella Grip M.D.   On: 08/23/2014 09:50    Scheduled Meds: . dextromethorphan-guaiFENesin  2 tablet Oral BID  . enoxaparin (LOVENOX) injection  40 mg Subcutaneous Q24H  . insulin aspart  0-20 Units Subcutaneous TID WC  . levalbuterol  0.63 mg Nebulization Q6H  . levofloxacin (LEVAQUIN) IV  750 mg Intravenous Q24H   . levothyroxine  100 mcg Oral QAC breakfast  . pantoprazole  40 mg Oral Q0600  . [START ON 08/25/2014] predniSONE  60 mg Oral Q breakfast  . vancomycin  1,500 mg Intravenous Q8H  . [START ON 08/25/2014] Vitamin D (Ergocalciferol)  50,000 Units Oral Q Wed   Continuous Infusions:   Principal Problem:   HCAP (healthcare-associated pneumonia) Active Problems:   Hypoxia   Obesity   HYPERTENSION, BENIGN ESSENTIAL   Iron deficiency anemia   Leukocytosis   Hypothyroidism   Tachycardia   DM (diabetes mellitus), type 2    Time spent: 36 minutes    Kymberlyn Eckford M.D. [facility] Pager [phone]. If 7PM-7AM, please contact night-coverage at [email or web address], password [facility] 08/24/2014, 5:59 PM  LOS: 1 day

## 2014-08-25 ENCOUNTER — Encounter: Payer: Self-pay | Admitting: Family Medicine

## 2014-08-25 LAB — GLUCOSE, CAPILLARY: Glucose-Capillary: 207 mg/dL — ABNORMAL HIGH (ref 70–99)

## 2014-08-25 MED ORDER — LEVOFLOXACIN 500 MG PO TABS
500.0000 mg | ORAL_TABLET | Freq: Every day | ORAL | Status: DC
  Administered 2014-08-25: 500 mg via ORAL
  Filled 2014-08-25: qty 1

## 2014-08-25 MED ORDER — LEVOFLOXACIN 500 MG PO TABS: 500.0000 mg | ORAL_TABLET | Freq: Every day | ORAL | Status: DC

## 2014-08-25 MED ORDER — PREDNISONE 20 MG PO TABS: ORAL_TABLET | ORAL | Status: DC

## 2014-08-25 NOTE — Discharge Summary (Signed)
Physician Discharge Summary  Lorraine Chambers [id number] DOB: 10-02-1985 DOA: 08/23/2014  PCP: Gerrit Heck, MD  Admit date: 08/23/2014 Discharge date: 08/25/2014  Time spent: 35 minutes  Recommendations for Outpatient Follow-up:  1. Follow-up with primary care physician 1 week 2. Consider CBC + CMET in 1 week 3. Continue diabetes mellitus controlled with metformin and recheck A1c in 3 mo [was 6.5 this admit], neuropathy, ophthalmopathy etc. as an outpatient-consider ACE inhibitor   Discharge Diagnoses:  Principal Problem:   HCAP (healthcare-associated pneumonia) Active Problems:   Obesity   HYPERTENSION, BENIGN ESSENTIAL   Iron deficiency anemia   Leukocytosis   Hypoxia   Hypothyroidism   Tachycardia   DM (diabetes mellitus), type 2   Discharge Condition: Good  Diet recommendation: Diabetic heart healthy  Filed Weights   08/23/14 0951  Weight: 154.722 kg (341 lb 1.6 oz)    History of present illness:  29 year old African-American female, attention, diabetes mellitus, iron deficiency anemia, Body mass index is 64.48 kg/(m^2)., works as a Electrical engineer at [employer] admitted on 1/18 /2016 with healthcare associated pneumonia. She was initially treated as an outpatient at the urgent care center for viral bronchitis and sent home. CT scan of the chest was performed which showed left lobe pneumonia She was started on H Coverage vancomycin + Levaquin She was ambulatory and did not desaturate on ambulation 5-6 times down the hall on 1/20 date of discharge. Her antibiotics were transitioned to oral Levaquin to complete 34-43 days of age Coverage, her albuterol inhaler was relabeled for home use although she has no prior diagnosis of asthma or COPD. She was given a quick steroid taper to 20 mg one more tablet prednisone 1/20 and she was discharged home Was felt that patient had maximally benefited from hospital stay and was stable for discharge At her request  a work for note was given to her excusing her for time in the hospital    Discharge Exam: Filed Vitals:   08/25/14 0410  BP: 127/96  Pulse: 82  Temp: 98 F (36.7 C)  Resp: 19    General: Alert pleasant oriented no apparent distress Cardiovascular: S1-S2 no murmur rub or gallop Respiratory: No wheeze no rales  Discharge Instructions   Discharge Instructions    Diet - low sodium heart healthy    Complete by:  As directed      Discharge instructions    Complete by:  As directed   You were diagnosed with healthcare associated pneumonia and treatment approach with IV antibiotics vancomycin and Levaquin-complete 3 more days of oral Levaquin 500 mg Complete 1 more day of by mouth prednisone 20 mg Please follow-up with Dr. Drema Dallas as an outpatient in about 1-2 weeks Please keep a close watch on her blood sugars as an outpatient I hope you have better year and have a nice week     Increase activity slowly    Complete by:  As directed           Current Discharge Medication List    START taking these medications   Details  levofloxacin (LEVAQUIN) 500 MG tablet Take 1 tablet (500 mg total) by mouth daily. Qty: 3 tablet, Refills: 0    predniSONE (DELTASONE) 20 MG tablet Take 1 tablet on 1/20 and then STOP Qty: 1 tablet, Refills: 0      CONTINUE these medications which have NOT CHANGED   Details  butalbital-acetaminophen-caffeine (FIORICET) 50-325-40 MG per tablet Take 1-2 tablets by mouth every 6 (six)  hours as needed for headache. Qty: 20 tablet, Refills: 0   Associated Diagnoses: Headache due to viral infection    ergocalciferol (VITAMIN D2) 50000 UNITS capsule Take 50,000 Units by mouth every Wednesday.    HYDROcodone-homatropine (HYCODAN) 5-1.5 MG/5ML syrup Take 5 mLs by mouth every 4 (four) hours as needed. Qty: 120 mL, Refills: 0   Associated Diagnoses: Cough    Levonorgestrel (SKYLA) 16.3 MG IUD 1 application by Intrauterine route once.    Associated Diagnoses:  Hydradenitis    levothyroxine (SYNTHROID, LEVOTHROID) 100 MCG tablet Take 100 mcg by mouth daily before breakfast.    metFORMIN (GLUCOPHAGE) 500 MG tablet Take 500 mg by mouth 2 (two) times daily with a meal.    Associated Diagnoses: Hydradenitis       No Known Allergies    The results of significant diagnostics from this hospitalization (including imaging, microbiology, ancillary and laboratory) are listed below for reference.    Significant Diagnostic Studies: Dg Chest 2 View  08/23/2014   CLINICAL DATA:  Shortness of breath, cough, congestion and chest pain for 1 week.  EXAM: CHEST  2 VIEW  COMPARISON:  Chest radiograph March 30, 2010  FINDINGS: Cardiomediastinal silhouette is unremarkable. Strandy densities LEFT lung base. The lungs are otherwise clear without pleural effusions or focal consolidations. Trachea projects midline and there is no pneumothorax. Soft tissue planes and included osseous structures are non-suspicious. Large body habitus.  IMPRESSION: Strandy densities LEFT lung base favor atelectasis on this habitus limited examination.   Electronically Signed   By: Elon Alas   On: 08/23/2014 02:04   Ct Chest W Contrast  08/23/2014   CLINICAL DATA:  Fever and wheezing ; pain  EXAM: CT CHEST WITH CONTRAST  TECHNIQUE: Multidetector CT imaging of the chest was performed during intravenous contrast administration.  CONTRAST:  167mL OMNIPAQUE IOHEXOL 300 MG/ML  SOLN  COMPARISON:  Chest radiograph August 23, 2014; chest CT March 31, 2010  FINDINGS: There is airspace consolidation in the posterior segment of the left upper lobe as well as in the anterior and lateral segments of the left lower lobe. There is mild atelectatic change in the lingula. Right lung is clear.  Heart is mildly enlarged.  The pericardium is not thickened.  There are multiple small mediastinal lymph nodes. There is no frank adenopathy by size criteria.  In the visualized upper abdomen, there is hepatic  steatosis.  There are no blastic or lytic bone lesions.  Thyroid appears normal.  There is no thoracic aortic aneurysm. No major vessel pulmonary embolus is appreciable.  IMPRESSION: Airspace disease consistent with pneumonia in the posterior segment of the left upper lobe as well as in portions of the left lower lobe. Right lung clear. Heart mildly enlarged. No adenopathy. Hepatic steatosis present.   Electronically Signed   By: Lowella Grip M.D.   On: 08/23/2014 09:50    Microbiology: No results found for this or any previous visit (from the past 240 hour(s)).   Labs: Basic Metabolic Panel:  Recent Labs Lab 08/23/14 0245 08/23/14 1005 08/24/14 0550  NA 135  --  140  K 3.6  --  4.3  CL 98  --  101  CO2 29  --  32  GLUCOSE 148*  --  241*  BUN 11  --  11  CREATININE 0.59 0.49* 0.49*  CALCIUM 8.7  --  8.9  MG  --  2.0  --    Liver Function Tests: No results for input(s): AST, ALT,  ALKPHOS, BILITOT, PROT, ALBUMIN in the last 168 hours. No results for input(s): LIPASE, AMYLASE in the last 168 hours. No results for input(s): AMMONIA in the last 168 hours. CBC:  Recent Labs Lab 08/23/14 0245 08/23/14 1005 08/24/14 0550  WBC 12.2* 11.7* 12.2*  NEUTROABS 7.8*  --  9.4*  HGB 12.4 12.0 11.1*  HCT 40.4 38.4 37.2  MCV 78.0 78.0 79.5  PLT 419* 372 383   Cardiac Enzymes: No results for input(s): CKTOTAL, CKMB, CKMBINDEX, TROPONINI in the last 168 hours. BNP: BNP (last 3 results) No results for input(s): PROBNP in the last 8760 hours. CBG:  Recent Labs Lab 08/23/14 2132 08/24/14 0724 08/24/14 1149 08/24/14 1710 08/24/14 2144  GLUCAP 258* 176* 144* 223* 179*       Signed:  Nita Sells  [facility] 08/25/2014, 10:19 AM

## 2014-08-25 NOTE — Care Management Note (Signed)
    Page 1 of 1   08/25/2014     1:09:29 PM CARE MANAGEMENT NOTE 08/25/2014  Patient:  Lorraine Chambers, Lorraine Chambers   Account Number:  [id number]  Date Initiated:  08/25/2014  Documentation initiated by:  Dessa Phi  Subjective/Objective Assessment:   29 y/o f admitted w/HCAP.     Action/Plan:   From home.Active w/[facility].   Anticipated DC Date:  08/25/2014   Anticipated DC Plan:  [place]  CM consult      Choice offered to / List presented to:             Status of service:   Medicare Important Message given?   (If response is "NO", the following Medicare IM given date fields will be blank) Date Medicare IM given:   Medicare IM given by:   Date Additional Medicare IM given:   Additional Medicare IM given by:    Discharge Disposition:  HOME/SELF CARE  Per UR Regulation:  Reviewed for med. necessity/level of care/duration of stay  If discussed at [place] of Stay Meetings, dates discussed:    Comments:  08/25/14 Dessa Phi RN BSN NCM [phone] D/C home no needs or orders.

## 2014-08-27 LAB — GLUCOSE, CAPILLARY: Glucose-Capillary: 98 mg/dL (ref 70–99)

## 2014-10-14 ENCOUNTER — Other Ambulatory Visit (INDEPENDENT_AMBULATORY_CARE_PROVIDER_SITE_OTHER): Payer: Self-pay

## 2014-10-15 ENCOUNTER — Other Ambulatory Visit (INDEPENDENT_AMBULATORY_CARE_PROVIDER_SITE_OTHER): Payer: Self-pay | Admitting: General Surgery

## 2014-11-04 ENCOUNTER — Encounter: Payer: Self-pay | Admitting: Dietician

## 2014-11-04 ENCOUNTER — Encounter: Payer: 59 | Attending: General Surgery | Admitting: Dietician

## 2014-11-04 VITALS — Ht 60.0 in | Wt 352.5 lb

## 2014-11-04 DIAGNOSIS — Z6841 Body Mass Index (BMI) 40.0 and over, adult: Secondary | ICD-10-CM | POA: Insufficient documentation

## 2014-11-04 DIAGNOSIS — E669 Obesity, unspecified: Secondary | ICD-10-CM | POA: Diagnosis present

## 2014-11-04 DIAGNOSIS — Z713 Dietary counseling and surveillance: Secondary | ICD-10-CM | POA: Diagnosis not present

## 2014-11-04 NOTE — Patient Instructions (Signed)
Follow Pre-Op Goals Try Protein Shakes Call [facility] at [phone] when surgery is scheduled to enroll in Pre-Op Class  Things to remember:  Please always be honest with [place]. We want to support you!  If you have any questions or concerns in between appointments, please call or email Ferol Luz, or Margarita Grizzle.  The diet after surgery will be high protein and low in carbohydrate.  Vitamins and calcium need to be taken for the rest of your life.  Feel free to include support people in any classes or appointments.  Talk with Dr. Ardath Sax about emotional eating

## 2014-11-04 NOTE — Progress Notes (Signed)
  Pre-Op Assessment Visit:  Pre-Operative Gastric sleeve Surgery  Medical Nutrition Therapy:  Appt start time: 1500   End time:  9767  Patient was seen on 11/04/14 for Pre-Operative Nutrition Assessment. Assessment and letter of approval faxed to [facility] Surgery Bariatric Surgery Program coordinator on 11/04/14.   Preferred Learning Style:   No preference indicated   Learning Readiness:   Ready  Handouts given during visit include:  Pre-Op Goals Bariatric Surgery Protein Shakes Pre op diet   During the appointment today the following Pre-Op Goals were reviewed with the patient: Maintain or lose weight as instructed by your surgeon Make healthy food choices Begin to limit portion sizes Limited concentrated sugars and fried foods Keep fat/sugar in the single digits per serving on   food labels Practice CHEWING your food  (aim for 30 chews per bite or until applesauce consistency) Practice not drinking 15 minutes before, during, and 30 minutes after each meal/snack Avoid all carbonated beverages  Avoid/limit caffeinated beverages  Avoid all sugar-sweetened beverages Consume 3 meals per day; eat every 3-5 hours Make a list of non-food related activities Aim for 64-100 ounces of FLUID daily  Aim for at least 60-80 grams of PROTEIN daily Look for a liquid protein source that contain ?15 g protein and ?5 g carbohydrate  (ex: shakes, drinks, shots)  Patient-Centered Goals: -Enjoying exercise -Overall health -Resolution of diabetes -Being able to buy smaller clothes -Bikini next summer!  Scale of 1-10: confidence (8-9) /importance (11)  Demonstrated degree of understanding via:  Teach Back  Teaching Method Utilized:  Visual Auditory Hands on  Barriers to learning/adherence to lifestyle change: none  Patient to call the [facility] to enroll in Pre-Op and Post-Op Nutrition Education when surgery date is scheduled.

## 2014-11-11 ENCOUNTER — Ambulatory Visit (HOSPITAL_COMMUNITY)
Admission: RE | Admit: 2014-11-11 | Discharge: 2014-11-11 | Disposition: A | Discharge status: Home / Self Care | Payer: 59 | Source: Ambulatory Visit | Attending: General Surgery | Admitting: General Surgery

## 2014-11-11 DIAGNOSIS — Z8701 Personal history of pneumonia (recurrent): Secondary | ICD-10-CM | POA: Diagnosis not present

## 2014-11-11 DIAGNOSIS — K760 Fatty (change of) liver, not elsewhere classified: Secondary | ICD-10-CM | POA: Insufficient documentation

## 2014-11-11 DIAGNOSIS — D509 Iron deficiency anemia, unspecified: Secondary | ICD-10-CM | POA: Diagnosis not present

## 2014-11-11 DIAGNOSIS — L732 Hidradenitis suppurativa: Secondary | ICD-10-CM | POA: Diagnosis not present

## 2014-11-11 DIAGNOSIS — E079 Disorder of thyroid, unspecified: Secondary | ICD-10-CM | POA: Diagnosis not present

## 2014-11-11 DIAGNOSIS — Z6841 Body Mass Index (BMI) 40.0 and over, adult: Secondary | ICD-10-CM | POA: Insufficient documentation

## 2014-11-11 DIAGNOSIS — R918 Other nonspecific abnormal finding of lung field: Secondary | ICD-10-CM | POA: Diagnosis not present

## 2014-11-11 DIAGNOSIS — I10 Essential (primary) hypertension: Secondary | ICD-10-CM | POA: Insufficient documentation

## 2014-11-11 DIAGNOSIS — E119 Type 2 diabetes mellitus without complications: Secondary | ICD-10-CM | POA: Insufficient documentation

## 2014-11-11 DIAGNOSIS — Z975 Presence of (intrauterine) contraceptive device: Secondary | ICD-10-CM | POA: Diagnosis not present

## 2014-12-02 ENCOUNTER — Ambulatory Visit (HOSPITAL_COMMUNITY): Admission: RE | Admit: 2014-12-02 | Payer: 59 | Source: Ambulatory Visit | Admitting: General Surgery

## 2014-12-02 ENCOUNTER — Encounter (HOSPITAL_COMMUNITY): Admission: RE | Payer: Self-pay | Source: Ambulatory Visit

## 2014-12-02 SURGERY — BREATH TEST, FOR HELICOBACTER PYLORI

## 2014-12-14 ENCOUNTER — Other Ambulatory Visit: Payer: Self-pay | Admitting: General Surgery

## 2014-12-15 ENCOUNTER — Other Ambulatory Visit: Payer: Self-pay | Admitting: General Surgery

## 2014-12-27 ENCOUNTER — Institutional Professional Consult (permissible substitution): Payer: 59 | Admitting: Pulmonary Disease

## 2014-12-28 ENCOUNTER — Ambulatory Visit: Payer: Self-pay

## 2014-12-31 ENCOUNTER — Institutional Professional Consult (permissible substitution): Payer: 59 | Admitting: Internal Medicine

## 2015-01-14 ENCOUNTER — Encounter: Payer: Self-pay | Admitting: Internal Medicine

## 2015-01-14 ENCOUNTER — Ambulatory Visit (INDEPENDENT_AMBULATORY_CARE_PROVIDER_SITE_OTHER): Payer: 59 | Admitting: Internal Medicine

## 2015-01-14 DIAGNOSIS — R05 Cough: Secondary | ICD-10-CM | POA: Diagnosis not present

## 2015-01-14 DIAGNOSIS — R0902 Hypoxemia: Secondary | ICD-10-CM | POA: Diagnosis not present

## 2015-01-14 DIAGNOSIS — R058 Other specified cough: Secondary | ICD-10-CM

## 2015-01-14 NOTE — Progress Notes (Signed)
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   01/14/2015 1st [facility] office visit/ Kru Allman   Chief Complaint  Patient presents with  . Pulmonary Consult    Referred by Dr. Greer Pickerel. Pt needing pulmonary clearance for gastric sleeve surgery. She had PNA and was hospitalized. She states still has occ wheezing.   does fine daytime zumba ok / main problem supine senses wheezing but no smothering/ able to lie flat ok and not much change p saba  No daytime hypersomnolence   No obvious day to day or daytime variabilty or assoc chronic cough or cp or chest tightness, subjective wheeze overt sinus or hb symptoms. No unusual exp hx or h/o childhood pna/ asthma or knowledge of premature birth.  Sleeping ok without nocturnal  or early am exacerbation  of respiratory  c/o's or need for noct saba. Also denies any obvious fluctuation of symptoms with weather or environmental changes or other aggravating or alleviating factors except as outlined above   Current Medications, Allergies, Complete Past Medical History, Past Surgical History, Family History, and Social History were reviewed in [employer] record.     Review of Systems  Constitutional: Negative for fever, chills and unexpected weight change.  HENT: Negative for congestion, dental problem, ear pain, nosebleeds, postnasal drip, rhinorrhea, sinus pressure, sneezing, sore throat, trouble swallowing and voice change.   Eyes: Negative for visual disturbance.  Respiratory: Negative for cough, choking and shortness of breath.   Cardiovascular: Negative for chest pain and leg swelling.  Gastrointestinal: Negative for vomiting,  abdominal pain and diarrhea.  Genitourinary: Negative for difficulty urinating.  Musculoskeletal: Positive for arthralgias.  Skin: Negative for rash.  Neurological: Negative for tremors, syncope and headaches.  Hematological: Does not bruise/bleed easily.       Objective:   Physical Exam  Wt Readings from Last 3 Encounters:  01/14/15 351 lb 3.2 oz (159.303 kg)  11/04/14 352 lb 8 oz (159.893 kg)  08/23/14 341 lb 1.6 oz (154.722 kg)    Vital signs reviewed   Massively obese pleasant amb bf nad   .HEENT: nl dentition, turbinates, and orophanx. Nl external ear canals without cough reflex   NECK :  without JVD/Nodes/TM/ nl carotid upstrokes bilaterally   LUNGS: no acc muscle use, clear to A and P bilaterally without cough on insp or exp maneuvers   CV:  RRR  no s3 or murmur or increase in P2, no edema   ABD:  massively obese but soft and nontender with nl excursion in the supine position. No bruits or organomegaly, bowel sounds nl  MS:  warm without deformities, calf tenderness, cyanosis or clubbing  SKIN: warm and dry without lesions    NEURO:  alert, approp, no deficits       I personally reviewed images and agree with radiology impression as follows:  CXR:  11/11/14  Interval clearing of left upper lobe infiltrate. No acute cardiopulmonary disease .     Chemistry      Component Value Date/Time   NA 140 08/24/2014 0550   K 4.3 08/24/2014 0550   CL 101 08/24/2014 0550   CO2 32 08/24/2014 0550   BUN  11 08/24/2014 0550   CREATININE 0.49* 08/24/2014 0550      Component Value Date/Time   CALCIUM 8.9 08/24/2014 0550   ALKPHOS 57 04/16/2008 0000   AST 16 04/16/2008 0000   ALT 22 04/16/2008 0000   BILITOT 0.6 04/16/2008 0000      EKG 01/14/15 nsr / wnl / would not transfer to epic / given to pt to take with her    Assessment & Plan:

## 2015-01-14 NOTE — Patient Instructions (Signed)
Pepcid 20 mg  Ac one at bedtime until after the surgery   GERD (REFLUX)  is an extremely common cause of respiratory symptoms just like yours , many times with no obvious heartburn at all.    It can be treated with medication, but also with lifestyle changes including avoidance of late meals, elevation of the head of your bed (ideally with 6 inch  bed blocks) excessive alcohol, smoking cessation, and avoid fatty foods, chocolate, peppermint, colas, red wine, and acidic juices such as orange juice.  NO MINT OR MENTHOL PRODUCTS SO NO COUGH DROPS  USE SUGARLESS CANDY INSTEAD (Jolley ranchers or Stover's or Life Savers) or even ice chips will also do - the key is to swallow to prevent all throat clearing. NO OIL BASED VITAMINS - use powdered substitutes.  You are cleared for surgery from a pulmonary perspective.

## 2015-01-15 ENCOUNTER — Encounter: Payer: Self-pay | Admitting: Internal Medicine

## 2015-01-15 DIAGNOSIS — R05 Cough: Secondary | ICD-10-CM | POA: Insufficient documentation

## 2015-01-15 DIAGNOSIS — R058 Other specified cough: Secondary | ICD-10-CM | POA: Insufficient documentation

## 2015-01-15 NOTE — Assessment & Plan Note (Signed)
-   Spirometry  01/14/15 > wnl except for minimal restrictive changes  - rec trial of pepcid 20 mg at hs plus diet/ lifestyle changes 01/14/2015    Classic Upper airway cough syndrome, so named because it's frequently impossible to sort out how much is  CR/sinusitis with freq throat clearing (which can be related to primary GERD)   vs  causing  secondary (" extra esophageal")  GERD from wide swings in gastric pressure that occur with throat clearing, often  promoting self use of mint and menthol lozenges that reduce the lower esophageal sphincter tone and exacerbate the problem further in a cyclical fashion.   These are the same pts (now being labeled as having "irritable larynx syndrome" by some cough centers) who not infrequently have a history of having failed to tolerate ace inhibitors,  dry powder inhalers or biphosphonates or report having atypical reflux symptoms that don't respond to standard doses of PPI , and are easily confused as having aecopd or asthma flares by even experienced allergists/ pulmonologists.   No evidence at all of asthma / rec trial of qhs pepcid/ lifestyle/diet  rx   No contraindication to surgery

## 2015-01-15 NOTE — Assessment & Plan Note (Signed)
Body mass index is 66.91 kg/(m^2).   Sleep study done 10/15/14 results not in epic but reported ok per pat  Her greatest risk for surgery for obesity is her obesity with all the usual risks she will need to accept including PE, atx/ resp failure  She does have HC03 high side of nl suggesting she is developing OHS and may need bridge with bipap/ early mobilization / minimal narcs but she is cleared for surgery and pccm service can see periop prn

## 2015-01-15 NOTE — Assessment & Plan Note (Signed)
Related to HCAP/ resolved and now able to do zumba s problems.  Prone though to ATX bases > 02 dep peri op >rx with IS /02 prn

## 2015-01-31 ENCOUNTER — Other Ambulatory Visit: Payer: Self-pay

## 2015-02-14 ENCOUNTER — Encounter: Payer: 59 | Attending: General Surgery

## 2015-02-14 DIAGNOSIS — Z01818 Encounter for other preprocedural examination: Secondary | ICD-10-CM | POA: Diagnosis not present

## 2015-02-14 DIAGNOSIS — Z6841 Body Mass Index (BMI) 40.0 and over, adult: Secondary | ICD-10-CM | POA: Insufficient documentation

## 2015-02-14 DIAGNOSIS — Z713 Dietary counseling and surveillance: Secondary | ICD-10-CM | POA: Insufficient documentation

## 2015-02-16 ENCOUNTER — Ambulatory Visit: Payer: Self-pay | Admitting: General Surgery

## 2015-02-18 NOTE — Progress Notes (Signed)
  Pre-Operative Nutrition Class:  Appt start time: 1610   End time:  1830.  Patient was seen on 02/14/2015 for Pre-Operative Bariatric Surgery Education at [facility].   Surgery date:  Surgery type: Sleeve Gastrectomy Start weight at [facility]: 352.5 lbs on 11/04/14 Weight today: 351 lbs  TANITA  BODY COMP RESULTS  02/14/15   BMI (kg/m^2) 66.3   Fat Mass (lbs) 188   Fat Free Mass (lbs) 163   Total Body Water (lbs) 119.5   Samples given per MNT protocol. Patient educated on appropriate usage: Unjury protein powder (qty 1 - chicken soup) Lot #: [id number] Exp: 02/2016  Celebrate Vitamins Multivitamin (qty 1 - orange) Lot #: [id number] Exp: 10/2016  Celebrate Vitamins Calcium Citrate chew (qty 1 -chocolate) Lot #: [id number] Exp: 10/2016  Premier protein shake (qty 1 - strawberry) Lot #: [id number] Exp: 04/2015    The following the learning objectives were met by the patient during this course:  Identify Pre-Op Dietary Goals and will begin 2 weeks pre-operatively  Identify appropriate sources of fluids and proteins   State protein recommendations and appropriate sources pre and post-operatively  Identify Post-Operative Dietary Goals and will follow for 2 weeks post-operatively  Identify appropriate multivitamin and calcium sources  Describe the need for physical activity post-operatively and will follow MD recommendations  State when to call healthcare provider regarding medication questions or post-operative complications  Handouts given during class include:  Pre-Op Bariatric Surgery Diet Handout  Protein Shake Handout  Post-Op Bariatric Surgery Nutrition Handout  BELT Program Information Flyer  Support Group Information Flyer  [facility] Bariatric Supplements Price List  Follow-Up Plan: Patient will follow-up at [facility] 2 weeks post operatively for diet advancement per MD.

## 2015-03-03 ENCOUNTER — Ambulatory Visit: Payer: Self-pay

## 2015-03-15 ENCOUNTER — Ambulatory Visit: Payer: 59

## 2015-03-20 NOTE — Patient Instructions (Signed)
Lorraine Chambers  03/20/2015   Your procedure is scheduled on:  March 29, 2015  Report to [facility] Main  Entrance take [place]  elevators to 3rd floor to  [place] at 5:30  AM.  Call this number if you have problems the morning of surgery [phone]   Remember: ONLY 1 PERSON MAY GO WITH YOU TO SHORT STAY TO GET  READY MORNING OF Lorraine Chambers.  Do not eat food or drink liquids :After Midnight.     Take these medicines the morning of surgery with A SIP OF WATER: Levothyroxine                               You may not have any metal on your body including hair pins and              piercings  Do not wear jewelry, make-up, lotions, powders or perfumes, deodorant             Do not wear nail polish.  Do not shave  48 hours prior to surgery.           Do not bring valuables to the [facility]. [place].  Contacts, dentures or bridgework may not be worn into surgery.  Leave suitcase in the car. After surgery it may be brought to your room.       Special Instructions: coughing and deep breathing exercises, leg exercises              Please read over the following fact sheets you were given: _____________________________________________________________________             Lorraine Chambers - Preparing for Surgery Before surgery, you can play an important role.  Because skin is not sterile, your skin needs to be as free of germs as possible.  You can reduce the number of germs on your skin by washing with CHG (chlorahexidine gluconate) soap before surgery.  CHG is an antiseptic cleaner which kills germs and bonds with the skin to continue killing germs even after washing. Please DO NOT use if you have an allergy to CHG or antibacterial soaps.  If your skin becomes reddened/irritated stop using the CHG and inform your nurse when you arrive at Short Stay. Do not shave (including legs and underarms) for at least 48  hours prior to the first CHG shower.  You may shave your face/neck. Please follow these instructions carefully:  1.  Shower with CHG Soap the night before surgery and the  morning of Surgery.  2.  If you choose to wash your hair, wash your hair first as usual with your  normal  shampoo.  3.  After you shampoo, rinse your hair and body thoroughly to remove the  shampoo.                           4.  Use CHG as you would any other liquid soap.  You can apply chg directly  to the skin and wash                       Gently with a scrungie or clean washcloth.  5.  Apply the CHG  Soap to your body ONLY FROM THE NECK DOWN.   Do not use on face/ open                           Wound or open sores. Avoid contact with eyes, ears mouth and genitals (private parts).                       Wash face,  Genitals (private parts) with your normal soap.             6.  Wash thoroughly, paying special attention to the area where your surgery  will be performed.  7.  Thoroughly rinse your body with warm water from the neck down.  8.  DO NOT shower/wash with your normal soap after using and rinsing off  the CHG Soap.                9.  Pat yourself dry with a clean towel.            10.  Wear clean pajamas.            11.  Place clean sheets on your bed the night of your first shower and do not  sleep with pets. Day of Surgery : Do not apply any lotions/deodorants the morning of surgery.  Please wear clean clothes to the [facility]/surgery center.  FAILURE TO FOLLOW THESE INSTRUCTIONS MAY RESULT IN THE CANCELLATION OF YOUR SURGERY PATIENT SIGNATURE_________________________________  NURSE SIGNATURE__________________________________  ________________________________________________________________________

## 2015-03-22 ENCOUNTER — Telehealth: Payer: Self-pay | Admitting: Internal Medicine

## 2015-03-22 ENCOUNTER — Encounter (HOSPITAL_COMMUNITY)
Admission: RE | Admit: 2015-03-22 | Discharge: 2015-03-22 | Disposition: A | Discharge status: Home / Self Care | Payer: 59 | Source: Ambulatory Visit | Attending: General Surgery | Admitting: General Surgery

## 2015-03-22 ENCOUNTER — Encounter (HOSPITAL_COMMUNITY): Payer: Self-pay

## 2015-03-22 ENCOUNTER — Ambulatory Visit (HOSPITAL_COMMUNITY)
Admission: RE | Admit: 2015-03-22 | Discharge: 2015-03-22 | Disposition: A | Discharge status: Home / Self Care | Payer: 59 | Source: Ambulatory Visit | Attending: Anesthesiology | Admitting: Anesthesiology

## 2015-03-22 DIAGNOSIS — I517 Cardiomegaly: Secondary | ICD-10-CM | POA: Insufficient documentation

## 2015-03-22 DIAGNOSIS — Z01818 Encounter for other preprocedural examination: Secondary | ICD-10-CM | POA: Insufficient documentation

## 2015-03-22 HISTORY — DX: Pneumonia, unspecified organism: J18.9

## 2015-03-22 HISTORY — DX: Gastro-esophageal reflux disease without esophagitis: K21.9

## 2015-03-22 HISTORY — DX: Headache: R51

## 2015-03-22 HISTORY — DX: Cardiac murmur, unspecified: R01.1

## 2015-03-22 HISTORY — DX: Headache, unspecified: R51.9

## 2015-03-22 LAB — CBC WITH DIFFERENTIAL/PLATELET
Basophils Absolute: 0 10*3/uL (ref 0.0–0.1)
Basophils Relative: 0 % (ref 0–1)
EOS PCT: 1 % (ref 0–5)
Eosinophils Absolute: 0.2 10*3/uL (ref 0.0–0.7)
HCT: 38.9 % (ref 36.0–46.0)
Hemoglobin: 11.8 g/dL — ABNORMAL LOW (ref 12.0–15.0)
LYMPHS ABS: 4.3 10*3/uL — AB (ref 0.7–4.0)
Lymphocytes Relative: 31 % (ref 12–46)
MCH: 23.8 pg — AB (ref 26.0–34.0)
MCHC: 30.3 g/dL (ref 30.0–36.0)
MCV: 78.6 fL (ref 78.0–100.0)
MONO ABS: 0.7 10*3/uL (ref 0.1–1.0)
MONOS PCT: 5 % (ref 3–12)
Neutro Abs: 8.7 10*3/uL — ABNORMAL HIGH (ref 1.7–7.7)
Neutrophils Relative %: 63 % (ref 43–77)
PLATELETS: 411 10*3/uL — AB (ref 150–400)
RBC: 4.95 MIL/uL (ref 3.87–5.11)
RDW: 14 % (ref 11.5–15.5)
WBC: 13.8 10*3/uL — ABNORMAL HIGH (ref 4.0–10.5)

## 2015-03-22 LAB — COMPREHENSIVE METABOLIC PANEL
ALT: 57 U/L — AB (ref 14–54)
AST: 37 U/L (ref 15–41)
Albumin: 4 g/dL (ref 3.5–5.0)
Alkaline Phosphatase: 48 U/L (ref 38–126)
Anion gap: 7 (ref 5–15)
BUN: 11 mg/dL (ref 6–20)
CHLORIDE: 100 mmol/L — AB (ref 101–111)
CO2: 30 mmol/L (ref 22–32)
CREATININE: 0.49 mg/dL (ref 0.44–1.00)
Calcium: 9.3 mg/dL (ref 8.9–10.3)
Glucose, Bld: 144 mg/dL — ABNORMAL HIGH (ref 65–99)
POTASSIUM: 4 mmol/L (ref 3.5–5.1)
Sodium: 137 mmol/L (ref 135–145)
TOTAL PROTEIN: 8.3 g/dL — AB (ref 6.5–8.1)
Total Bilirubin: 1.2 mg/dL (ref 0.3–1.2)

## 2015-03-22 LAB — PROTIME-INR
INR: 1.01 (ref 0.00–1.49)
PROTHROMBIN TIME: 13.5 s (ref 11.6–15.2)

## 2015-03-22 LAB — APTT: aPTT: 31 seconds (ref 24–37)

## 2015-03-22 NOTE — Progress Notes (Signed)
03-22-15 - At preop visit, was able to view EKG in EPIC from 08-23-14. Did not need to repeat EKG at preop visit on 03-22-15.

## 2015-03-22 NOTE — Progress Notes (Signed)
01-14-15 - LOV - Dr. Melvyn Novas (pulmon) and surgical clearance - EPIC 01-14-15 - EKG - did not transfer to [facility].  Was in [facility] system.  Called EKG at [facility] and EKG of 01-14-15 could not be found. 01-14-15 - Spirometry - EPIC 11-11-14 - CXR - EPIC 08-23-14 - EKG - EPIC 08-19-14 - LOV - Dr. Linna Darner (Fam.Med.)- EPIC 05-11-14 - LOV - Dr. Alvy Bimler (onc) - EPIC

## 2015-03-22 NOTE — Telephone Encounter (Signed)
Called and spoke to Lorraine Chambers is requesting the EKG results that were done on 6.10.16. Order was placed as an "ED EKG" and can only be viewed by using the MUSE login. Pam stated she would get another EKG. Nothing further needed at this time.

## 2015-03-29 ENCOUNTER — Inpatient Hospital Stay (HOSPITAL_COMMUNITY): Payer: 59 | Admitting: Anesthesiology

## 2015-03-29 ENCOUNTER — Encounter (HOSPITAL_COMMUNITY): Payer: Self-pay | Admitting: Anesthesiology

## 2015-03-29 ENCOUNTER — Encounter (HOSPITAL_COMMUNITY)
Admission: RE | Disposition: A | Discharge status: Home / Self Care | Payer: Self-pay | Source: Ambulatory Visit | Attending: General Surgery

## 2015-03-29 ENCOUNTER — Inpatient Hospital Stay (HOSPITAL_COMMUNITY)
Admission: RE | Admit: 2015-03-29 | Discharge: 2015-04-01 | DRG: 641 | Disposition: A | Discharge status: Home / Self Care | Payer: 59 | Source: Ambulatory Visit | Attending: General Surgery | Admitting: General Surgery

## 2015-03-29 DIAGNOSIS — I10 Essential (primary) hypertension: Secondary | ICD-10-CM | POA: Diagnosis present

## 2015-03-29 DIAGNOSIS — K219 Gastro-esophageal reflux disease without esophagitis: Secondary | ICD-10-CM | POA: Diagnosis present

## 2015-03-29 DIAGNOSIS — Z6841 Body Mass Index (BMI) 40.0 and over, adult: Secondary | ICD-10-CM | POA: Diagnosis not present

## 2015-03-29 DIAGNOSIS — R110 Nausea: Secondary | ICD-10-CM

## 2015-03-29 DIAGNOSIS — E039 Hypothyroidism, unspecified: Secondary | ICD-10-CM | POA: Diagnosis present

## 2015-03-29 DIAGNOSIS — K760 Fatty (change of) liver, not elsewhere classified: Secondary | ICD-10-CM | POA: Diagnosis present

## 2015-03-29 DIAGNOSIS — Z9884 Bariatric surgery status: Secondary | ICD-10-CM

## 2015-03-29 DIAGNOSIS — E559 Vitamin D deficiency, unspecified: Secondary | ICD-10-CM | POA: Diagnosis present

## 2015-03-29 DIAGNOSIS — E119 Type 2 diabetes mellitus without complications: Secondary | ICD-10-CM | POA: Diagnosis present

## 2015-03-29 HISTORY — PX: UPPER GI ENDOSCOPY: SHX6162

## 2015-03-29 HISTORY — PX: LAPAROSCOPIC GASTRIC SLEEVE RESECTION: SHX5895

## 2015-03-29 LAB — HEMOGLOBIN AND HEMATOCRIT, BLOOD
HCT: 38.9 % (ref 36.0–46.0)
Hemoglobin: 11.8 g/dL — ABNORMAL LOW (ref 12.0–15.0)

## 2015-03-29 LAB — GLUCOSE, CAPILLARY
GLUCOSE-CAPILLARY: 245 mg/dL — AB (ref 65–99)
Glucose-Capillary: 112 mg/dL — ABNORMAL HIGH (ref 65–99)
Glucose-Capillary: 220 mg/dL — ABNORMAL HIGH (ref 65–99)
Glucose-Capillary: 160 mg/dL — ABNORMAL HIGH (ref 65–99)
Glucose-Capillary: 121 mg/dL — ABNORMAL HIGH (ref 65–99)

## 2015-03-29 LAB — PREGNANCY, URINE: Preg Test, Ur: NEGATIVE

## 2015-03-29 SURGERY — GASTRECTOMY, SLEEVE, LAPAROSCOPIC
Anesthesia: General

## 2015-03-29 MED ORDER — ONDANSETRON HCL 4 MG/2ML IJ SOLN
4.0000 mg | INTRAMUSCULAR | Status: DC | PRN
  Administered 2015-03-29 – 2015-03-31 (×2): 4 mg via INTRAVENOUS
  Filled 2015-03-29 (×3): qty 2

## 2015-03-29 MED ORDER — NEOSTIGMINE METHYLSULFATE 10 MG/10ML IV SOLN
INTRAVENOUS | Status: DC | PRN
  Administered 2015-03-29: 6 mg via INTRAVENOUS

## 2015-03-29 MED ORDER — LACTATED RINGERS IR SOLN
Status: DC | PRN
  Administered 2015-03-29: 1000 mL

## 2015-03-29 MED ORDER — UNJURY VANILLA POWDER
2.0000 [oz_av] | Freq: Four times a day (QID) | ORAL | Status: DC
  Administered 2015-03-31 – 2015-04-01 (×2): 2 [oz_av] via ORAL

## 2015-03-29 MED ORDER — PROMETHAZINE HCL 25 MG/ML IJ SOLN: 6.2500 mg | INTRAMUSCULAR | Status: DC | PRN

## 2015-03-29 MED ORDER — POTASSIUM CHLORIDE IN NACL 20-0.45 MEQ/L-% IV SOLN
INTRAVENOUS | Status: DC
  Administered 2015-03-29 – 2015-03-30 (×3): via INTRAVENOUS
  Administered 2015-03-30 – 2015-03-31 (×3): 1000 mL via INTRAVENOUS
  Administered 2015-03-31 – 2015-04-01 (×2): via INTRAVENOUS
  Filled 2015-03-29 (×14): qty 1000

## 2015-03-29 MED ORDER — TISSEEL VH 10 ML EX KIT
PACK | CUTANEOUS | Status: AC
  Filled 2015-03-29: qty 2

## 2015-03-29 MED ORDER — INSULIN ASPART 100 UNIT/ML ~~LOC~~ SOLN
SUBCUTANEOUS | Status: AC
  Filled 2015-03-29: qty 1

## 2015-03-29 MED ORDER — ONDANSETRON HCL 4 MG/2ML IJ SOLN
INTRAMUSCULAR | Status: AC
  Filled 2015-03-29: qty 2

## 2015-03-29 MED ORDER — MEPERIDINE HCL 50 MG/ML IJ SOLN: 6.2500 mg | INTRAMUSCULAR | Status: DC | PRN

## 2015-03-29 MED ORDER — ACETAMINOPHEN 10 MG/ML IV SOLN
1000.0000 mg | Freq: Four times a day (QID) | INTRAVENOUS | Status: AC
  Administered 2015-03-29 – 2015-03-30 (×4): 1000 mg via INTRAVENOUS
  Filled 2015-03-29 (×4): qty 100

## 2015-03-29 MED ORDER — ROCURONIUM BROMIDE 100 MG/10ML IV SOLN
INTRAVENOUS | Status: AC
  Filled 2015-03-29: qty 1

## 2015-03-29 MED ORDER — HEPARIN SODIUM (PORCINE) 5000 UNIT/ML IJ SOLN
5000.0000 [IU] | INTRAMUSCULAR | Status: AC
  Administered 2015-03-29: 5000 [IU] via SUBCUTANEOUS
  Filled 2015-03-29: qty 1

## 2015-03-29 MED ORDER — FENTANYL CITRATE (PF) 100 MCG/2ML IJ SOLN: 25.0000 ug | INTRAMUSCULAR | Status: DC | PRN

## 2015-03-29 MED ORDER — SODIUM CHLORIDE 0.9 % IJ SOLN
INTRAMUSCULAR | Status: AC
  Filled 2015-03-29: qty 10

## 2015-03-29 MED ORDER — CEFOTETAN DISODIUM-DEXTROSE 2-2.08 GM-% IV SOLR
INTRAVENOUS | Status: AC
  Filled 2015-03-29: qty 50

## 2015-03-29 MED ORDER — MIDAZOLAM HCL 5 MG/5ML IJ SOLN
INTRAMUSCULAR | Status: DC | PRN
  Administered 2015-03-29: 2 mg via INTRAVENOUS

## 2015-03-29 MED ORDER — ARTIFICIAL TEARS OP OINT
TOPICAL_OINTMENT | OPHTHALMIC | Status: AC
  Filled 2015-03-29: qty 3.5

## 2015-03-29 MED ORDER — HYDROMORPHONE HCL 1 MG/ML IJ SOLN
INTRAMUSCULAR | Status: DC | PRN
  Administered 2015-03-29: .2 mg via INTRAVENOUS
  Administered 2015-03-29: .4 mg via INTRAVENOUS
  Administered 2015-03-29: .2 mg via INTRAVENOUS

## 2015-03-29 MED ORDER — PROPOFOL 10 MG/ML IV BOLUS
INTRAVENOUS | Status: AC
  Filled 2015-03-29: qty 20

## 2015-03-29 MED ORDER — PHENYLEPHRINE HCL 10 MG/ML IJ SOLN
INTRAMUSCULAR | Status: DC | PRN
  Administered 2015-03-29 (×2): 40 ug via INTRAVENOUS

## 2015-03-29 MED ORDER — BUPIVACAINE LIPOSOME 1.3 % IJ SUSP
INTRAMUSCULAR | Status: DC | PRN
  Administered 2015-03-29: 20 mL

## 2015-03-29 MED ORDER — PANTOPRAZOLE SODIUM 40 MG IV SOLR
40.0000 mg | Freq: Every day | INTRAVENOUS | Status: DC
  Administered 2015-03-29: 40 mg via INTRAVENOUS
  Filled 2015-03-29 (×4): qty 40

## 2015-03-29 MED ORDER — SUGAMMADEX SODIUM 200 MG/2ML IV SOLN
INTRAVENOUS | Status: AC
  Filled 2015-03-29: qty 2

## 2015-03-29 MED ORDER — EPHEDRINE SULFATE 50 MG/ML IJ SOLN
INTRAMUSCULAR | Status: AC
  Filled 2015-03-29: qty 1

## 2015-03-29 MED ORDER — LACTATED RINGERS IV SOLN: INTRAVENOUS | Status: DC

## 2015-03-29 MED ORDER — HYDROMORPHONE HCL 2 MG/ML IJ SOLN
INTRAMUSCULAR | Status: AC
  Filled 2015-03-29: qty 1

## 2015-03-29 MED ORDER — LIDOCAINE HCL (CARDIAC) 20 MG/ML IV SOLN
INTRAVENOUS | Status: AC
  Filled 2015-03-29: qty 15

## 2015-03-29 MED ORDER — PHENYLEPHRINE 40 MCG/ML (10ML) SYRINGE FOR IV PUSH (FOR BLOOD PRESSURE SUPPORT)
PREFILLED_SYRINGE | INTRAVENOUS | Status: AC
  Filled 2015-03-29: qty 10

## 2015-03-29 MED ORDER — PROPOFOL 10 MG/ML IV BOLUS
INTRAVENOUS | Status: DC | PRN
  Administered 2015-03-29: 200 mg via INTRAVENOUS

## 2015-03-29 MED ORDER — LIDOCAINE HCL (CARDIAC) 20 MG/ML IV SOLN
INTRAVENOUS | Status: DC | PRN
  Administered 2015-03-29: 50 mg via INTRAVENOUS

## 2015-03-29 MED ORDER — FENTANYL CITRATE (PF) 250 MCG/5ML IJ SOLN
INTRAMUSCULAR | Status: AC
  Filled 2015-03-29: qty 25

## 2015-03-29 MED ORDER — NALOXONE HCL 0.4 MG/ML IJ SOLN
INTRAMUSCULAR | Status: DC | PRN
  Administered 2015-03-29: 80 ug via INTRAVENOUS

## 2015-03-29 MED ORDER — UNJURY CHOCOLATE CLASSIC POWDER
2.0000 [oz_av] | Freq: Four times a day (QID) | ORAL | Status: DC
  Administered 2015-03-31: 2 [oz_av] via ORAL

## 2015-03-29 MED ORDER — INSULIN ASPART 100 UNIT/ML ~~LOC~~ SOLN
0.0000 [IU] | SUBCUTANEOUS | Status: DC
  Administered 2015-03-29: 3 [IU] via SUBCUTANEOUS
  Administered 2015-03-29 (×2): 7 [IU] via SUBCUTANEOUS
  Administered 2015-03-29: 4 [IU] via SUBCUTANEOUS
  Administered 2015-03-30 (×2): 3 [IU] via SUBCUTANEOUS

## 2015-03-29 MED ORDER — DEXAMETHASONE SODIUM PHOSPHATE 10 MG/ML IJ SOLN
INTRAMUSCULAR | Status: DC | PRN
  Administered 2015-03-29: 8 mg via INTRAVENOUS

## 2015-03-29 MED ORDER — SUGAMMADEX SODIUM 500 MG/5ML IV SOLN
INTRAVENOUS | Status: DC | PRN
  Administered 2015-03-29: 200 mg via INTRAVENOUS

## 2015-03-29 MED ORDER — DEXAMETHASONE SODIUM PHOSPHATE 10 MG/ML IJ SOLN
INTRAMUSCULAR | Status: AC
  Filled 2015-03-29: qty 1

## 2015-03-29 MED ORDER — UNJURY CHICKEN SOUP POWDER: 2.0000 [oz_av] | Freq: Four times a day (QID) | ORAL | Status: DC

## 2015-03-29 MED ORDER — OXYCODONE HCL 5 MG/5ML PO SOLN
5.0000 mg | ORAL | Status: DC | PRN
  Administered 2015-03-30 – 2015-03-31 (×2): 10 mg via ORAL
  Administered 2015-04-01: 5 mg via ORAL
  Filled 2015-03-29 (×2): qty 10
  Filled 2015-03-29: qty 5

## 2015-03-29 MED ORDER — ROCURONIUM BROMIDE 100 MG/10ML IV SOLN
INTRAVENOUS | Status: DC | PRN
  Administered 2015-03-29 (×3): 10 mg via INTRAVENOUS
  Administered 2015-03-29: 40 mg via INTRAVENOUS

## 2015-03-29 MED ORDER — LEVOTHYROXINE SODIUM 100 MCG PO TABS
100.0000 ug | ORAL_TABLET | Freq: Every day | ORAL | Status: DC
  Administered 2015-03-30 – 2015-04-01 (×2): 100 ug via ORAL
  Filled 2015-03-29 (×5): qty 1

## 2015-03-29 MED ORDER — GLYCOPYRROLATE 0.2 MG/ML IJ SOLN
INTRAMUSCULAR | Status: DC | PRN
  Administered 2015-03-29: .8 mg via INTRAVENOUS

## 2015-03-29 MED ORDER — BUPIVACAINE LIPOSOME 1.3 % IJ SUSP
20.0000 mL | Freq: Once | INTRAMUSCULAR | Status: DC
  Filled 2015-03-29: qty 20

## 2015-03-29 MED ORDER — ACETAMINOPHEN 10 MG/ML IV SOLN
1000.0000 mg | Freq: Once | INTRAVENOUS | Status: AC
  Administered 2015-03-29: 1000 mg via INTRAVENOUS

## 2015-03-29 MED ORDER — FENTANYL CITRATE (PF) 100 MCG/2ML IJ SOLN
INTRAMUSCULAR | Status: DC | PRN
  Administered 2015-03-29: 50 ug via INTRAVENOUS
  Administered 2015-03-29: 150 ug via INTRAVENOUS
  Administered 2015-03-29: 50 ug via INTRAVENOUS

## 2015-03-29 MED ORDER — PROMETHAZINE HCL 25 MG/ML IJ SOLN
12.5000 mg | Freq: Four times a day (QID) | INTRAMUSCULAR | Status: DC | PRN
  Administered 2015-03-29 – 2015-03-30 (×3): 12.5 mg via INTRAVENOUS
  Filled 2015-03-29 (×3): qty 1

## 2015-03-29 MED ORDER — LACTATED RINGERS IV SOLN
INTRAVENOUS | Status: DC | PRN
  Administered 2015-03-29 (×2): via INTRAVENOUS

## 2015-03-29 MED ORDER — MIDAZOLAM HCL 2 MG/2ML IJ SOLN
INTRAMUSCULAR | Status: AC
  Filled 2015-03-29: qty 4

## 2015-03-29 MED ORDER — DEXTROSE 5 % IV SOLN
500.0000 mg | Freq: Three times a day (TID) | INTRAVENOUS | Status: DC
  Administered 2015-03-29 – 2015-04-01 (×9): 500 mg via INTRAVENOUS
  Filled 2015-03-29 (×16): qty 5

## 2015-03-29 MED ORDER — GLYCOPYRROLATE 0.2 MG/ML IJ SOLN
INTRAMUSCULAR | Status: AC
  Filled 2015-03-29: qty 2

## 2015-03-29 MED ORDER — ACETAMINOPHEN 10 MG/ML IV SOLN
INTRAVENOUS | Status: AC
  Filled 2015-03-29: qty 100

## 2015-03-29 MED ORDER — NEOSTIGMINE METHYLSULFATE 10 MG/10ML IV SOLN
INTRAVENOUS | Status: AC
  Filled 2015-03-29: qty 1

## 2015-03-29 MED ORDER — ACETAMINOPHEN 160 MG/5ML PO SOLN
325.0000 mg | ORAL | Status: DC | PRN
  Filled 2015-03-29: qty 20.3

## 2015-03-29 MED ORDER — SODIUM CHLORIDE 0.9 % IJ SOLN
INTRAMUSCULAR | Status: DC | PRN
  Administered 2015-03-29: 50 mL via INTRAVENOUS

## 2015-03-29 MED ORDER — CEFOTETAN DISODIUM-DEXTROSE 2-2.08 GM-% IV SOLR
2.0000 g | INTRAVENOUS | Status: AC
  Administered 2015-03-29: 2 g via INTRAVENOUS

## 2015-03-29 MED ORDER — ENOXAPARIN SODIUM 30 MG/0.3ML ~~LOC~~ SOLN
30.0000 mg | Freq: Two times a day (BID) | SUBCUTANEOUS | Status: DC
  Administered 2015-03-30 – 2015-04-01 (×5): 30 mg via SUBCUTANEOUS
  Filled 2015-03-29 (×8): qty 0.3

## 2015-03-29 MED ORDER — MORPHINE SULFATE (PF) 2 MG/ML IV SOLN
INTRAVENOUS | Status: AC
  Filled 2015-03-29: qty 1

## 2015-03-29 MED ORDER — SODIUM CHLORIDE 0.9 % IJ SOLN
INTRAMUSCULAR | Status: AC
  Filled 2015-03-29: qty 50

## 2015-03-29 MED ORDER — ACETAMINOPHEN 160 MG/5ML PO SOLN
650.0000 mg | ORAL | Status: DC | PRN
  Administered 2015-03-31 (×2): 650 mg via ORAL
  Filled 2015-03-29 (×2): qty 20.3

## 2015-03-29 MED ORDER — MORPHINE SULFATE (PF) 2 MG/ML IV SOLN
2.0000 mg | INTRAVENOUS | Status: DC | PRN
  Administered 2015-03-29: 2 mg via INTRAVENOUS

## 2015-03-29 MED ORDER — SUCCINYLCHOLINE CHLORIDE 20 MG/ML IJ SOLN
INTRAMUSCULAR | Status: DC | PRN
  Administered 2015-03-29: 140 mg via INTRAVENOUS

## 2015-03-29 SURGICAL SUPPLY — 68 items
ADH SKN CLS APL DERMABOND .7 (GAUZE/BANDAGES/DRESSINGS) ×2
APL SRG 32X5 SNPLK LF DISP (MISCELLANEOUS)
APPLICATOR COTTON TIP 6IN STRL (MISCELLANEOUS) IMPLANT
APPLIER CLIP ROT 10 11.4 M/L (STAPLE)
APR CLP MED LRG 11.4X10 (STAPLE)
BLADE SURG SZ11 CARB STEEL (BLADE) ×3 IMPLANT
CABLE HIGH FREQUENCY MONO STRZ (ELECTRODE) ×1 IMPLANT
CHLORAPREP W/TINT 26ML (MISCELLANEOUS) ×6 IMPLANT
CLIP APPLIE ROT 10 11.4 M/L (STAPLE) IMPLANT
COVER SURGICAL LIGHT HANDLE (MISCELLANEOUS) ×2 IMPLANT
DERMABOND ADVANCED (GAUZE/BANDAGES/DRESSINGS) ×1
DERMABOND ADVANCED .7 DNX12 (GAUZE/BANDAGES/DRESSINGS) ×2 IMPLANT
DEVICE SUT QUICK LOAD TK 5 (STAPLE) IMPLANT
DEVICE SUT TI-KNOT TK 5X26 (MISCELLANEOUS) IMPLANT
DEVICE SUTURE ENDOST 10MM (ENDOMECHANICALS) IMPLANT
DEVICE TROCAR PUNCTURE CLOSURE (ENDOMECHANICALS) ×3 IMPLANT
DRAPE CAMERA CLOSED 9X96 (DRAPES) ×3 IMPLANT
DRAPE UTILITY XL STRL (DRAPES) ×6 IMPLANT
ELECT REM PT RETURN 9FT ADLT (ELECTROSURGICAL) ×3
ELECTRODE REM PT RTRN 9FT ADLT (ELECTROSURGICAL) ×2 IMPLANT
GAUZE SPONGE 4X4 12PLY STRL (GAUZE/BANDAGES/DRESSINGS) IMPLANT
GLOVE BIOGEL M STRL SZ7.5 (GLOVE) ×3 IMPLANT
GOWN STRL REUS W/TWL XL LVL3 (GOWN DISPOSABLE) ×10 IMPLANT
HOVERMATT SINGLE USE (MISCELLANEOUS) ×3 IMPLANT
KIT BASIN OR (CUSTOM PROCEDURE TRAY) ×3 IMPLANT
NDL SPNL 22GX3.5 QUINCKE BK (NEEDLE) ×2 IMPLANT
NEEDLE SPNL 22GX3.5 QUINCKE BK (NEEDLE) ×3 IMPLANT
PACK UNIVERSAL I (CUSTOM PROCEDURE TRAY) ×3 IMPLANT
PEN SKIN MARKING BROAD (MISCELLANEOUS) ×3 IMPLANT
RELOAD STAPLE 60 3.6 BLU REG (STAPLE) IMPLANT
RELOAD STAPLE 60 3.8 GOLD REG (STAPLE) IMPLANT
RELOAD STAPLE 60 4.1 GRN THCK (STAPLE) IMPLANT
RELOAD STAPLE 60 BLK VRY/THCK (STAPLE) IMPLANT
RELOAD STAPLER 60MM BLK (STAPLE) ×2 IMPLANT
RELOAD STAPLER BLUE 60MM (STAPLE) ×4 IMPLANT
RELOAD STAPLER GOLD 60MM (STAPLE) IMPLANT
RELOAD STAPLER GREEN 60MM (STAPLE) ×4 IMPLANT
SCISSORS LAP 5X45 EPIX DISP (ENDOMECHANICALS) ×3 IMPLANT
SEALANT SURGICAL APPL DUAL CAN (MISCELLANEOUS) IMPLANT
SET IRRIG TUBING LAPAROSCOPIC (IRRIGATION / IRRIGATOR) ×3 IMPLANT
SHEARS CURVED HARMONIC AC 45CM (MISCELLANEOUS) ×3 IMPLANT
SLEEVE ADV FIXATION 5X100MM (TROCAR) ×6 IMPLANT
SLEEVE GASTRECTOMY 36FR VISIGI (MISCELLANEOUS) ×3 IMPLANT
SLEEVE XCEL OPT CAN 5 100 (ENDOMECHANICALS) IMPLANT
SOLUTION ANTI FOG 6CC (MISCELLANEOUS) ×3 IMPLANT
SPONGE LAP 18X18 X RAY DECT (DISPOSABLE) ×3 IMPLANT
STAPLER ECHELON BIOABSB 60 FLE (MISCELLANEOUS) ×9 IMPLANT
STAPLER ECHELON LONG 60 440 (INSTRUMENTS) ×1 IMPLANT
STAPLER RELOAD 60MM BLK (STAPLE) ×3
STAPLER RELOAD BLUE 60MM (STAPLE) ×6
STAPLER RELOAD GOLD 60MM (STAPLE)
STAPLER RELOAD GREEN 60MM (STAPLE) ×6
SUT MNCRL AB 4-0 PS2 18 (SUTURE) ×3 IMPLANT
SUT SURGIDAC NAB ES-9 0 48 120 (SUTURE) IMPLANT
SUT VICRYL 0 TIES 12 18 (SUTURE) ×3 IMPLANT
SYR 10ML ECCENTRIC (SYRINGE) ×3 IMPLANT
SYR 20CC LL (SYRINGE) ×3 IMPLANT
SYR 50ML LL SCALE MARK (SYRINGE) ×3 IMPLANT
TOWEL OR 17X26 10 PK STRL BLUE (TOWEL DISPOSABLE) ×3 IMPLANT
TOWEL OR NON WOVEN STRL DISP B (DISPOSABLE) ×3 IMPLANT
TRAY FOLEY W/METER SILVER 14FR (SET/KITS/TRAYS/PACK) IMPLANT
TRAY FOLEY W/METER SILVER 16FR (SET/KITS/TRAYS/PACK) IMPLANT
TROCAR ADV FIXATION 5X100MM (TROCAR) ×3 IMPLANT
TROCAR BLADELESS 15MM (ENDOMECHANICALS) ×3 IMPLANT
TROCAR BLADELESS OPT 5 100 (ENDOMECHANICALS) ×3 IMPLANT
TUBING CONNECTING 10 (TUBING) ×3 IMPLANT
TUBING ENDO SMARTCAP (MISCELLANEOUS) ×3 IMPLANT
TUBING FILTER THERMOFLATOR (ELECTROSURGICAL) ×3 IMPLANT

## 2015-03-29 NOTE — Anesthesia Preprocedure Evaluation (Addendum)
Anesthesia Evaluation  Patient identified by MRN, date of birth, ID band Patient awake    Reviewed: Allergy & Precautions, NPO status , Patient's Chart, lab work & pertinent test results  Airway Mallampati: II  TM Distance: >3 FB Neck ROM: Full    Dental no notable dental hx.    Pulmonary neg pulmonary ROS,  breath sounds clear to auscultation  Pulmonary exam normal       Cardiovascular hypertension, Normal cardiovascular examRhythm:Regular Rate:Normal     Neuro/Psych negative neurological ROS  negative psych ROS   GI/Hepatic negative GI ROS, Neg liver ROS,   Endo/Other  diabetes, Type 2, Oral Hypoglycemic AgentsMorbid obesity  Renal/GU negative Renal ROS  negative genitourinary   Musculoskeletal negative musculoskeletal ROS (+)   Abdominal (+) + obese,   Peds negative pediatric ROS (+)  Hematology negative hematology ROS (+)   Anesthesia Other Findings   Reproductive/Obstetrics negative OB ROS                            Anesthesia Physical Anesthesia Plan  ASA: III  Anesthesia Plan: General   Post-op Pain Management:    Induction: Intravenous  Airway Management Planned: Oral ETT  Additional Equipment:   Intra-op Plan:   Post-operative Plan: Extubation in OR  Informed Consent: I have reviewed the patients History and Physical, chart, labs and discussed the procedure including the risks, benefits and alternatives for the proposed anesthesia with the patient or authorized representative who has indicated his/her understanding and acceptance.   Dental advisory given  Plan Discussed with: CRNA  Anesthesia Plan Comments:         Anesthesia Quick Evaluation

## 2015-03-29 NOTE — Transfer of Care (Signed)
Immediate Anesthesia Transfer of Care Note  Patient: Lorraine Chambers  Procedure(s) Performed: Procedure(s): LAPAROSCOPIC GASTRIC SLEEVE RESECTION WITH UPPER ENDO (N/A) UPPER GI ENDOSCOPY  Patient Location: PACU  Anesthesia Type:General  Level of Consciousness:  sedated, patient cooperative and responds to stimulation  Airway & Oxygen Therapy:Patient Spontanous Breathing and Patient connected to face mask oxgen  Post-op Assessment:  Report given to PACU RN and Post -op Vital signs reviewed and stable  Post vital signs:  Reviewed and stable  Last Vitals:  Filed Vitals:   03/29/15 0719  BP: 141/80  Pulse: 90  Temp: 36.6 C  Resp: 18    Complications: No apparent anesthesia complications

## 2015-03-29 NOTE — H&P (Signed)
Lorraine R. [name] 03/23/2015 9:25 AM Location: [facility] Patient #: [id number] DOB: 1986-03-16 Single / Language: Lorraine Chambers / Race: Black or African American Female  History of Present Illness [name] M. Avalene Sealy MD; 03/23/2015 9:45 AM) The patient is a 29 year old female who presents for a pre-op visit. She has been approved for laparoscopic sleeve gastrectomy and upper endoscopy for August 23. I initially met her on March 10. Her weight at that time was 350 pounds. She denies any changes to her medical history since she was initially evaluated. She denies any chest emergency room or hospital. She did see pulmonary because of her history of pneumonia and was cleared by pulmonary. She has some occasional reflux. She denies any fever, chills, nausea, vomiting, abdominal pain. She denies any shortness of breath.  Her upper GI was within normal limits. Her chest x-ray was within normal limits. Her abdominal ultrasound showed evidence of fatty liver disease. Her initial evaluation labs showed a vitamin D deficiency. She is getting vitamin D replacement by her primary care physician.   Problem List/Past Medical [name] Ronnie Derby, MD; 03/23/2015 9:45 AM) HISTORY OF PNEUMONIA (V12.61  Z87.01) MORBID OBESITY WITH BMI OF 60.0-69.9, ADULT (278.01  E66.01) DIABETES MELLITUS WITHOUT COMPLICATION (892.11  H41.7) ESSENTIAL HYPERTENSION (401.9  I10) ADULT HYPOTHYROIDISM (244.9  E03.9) FAMILY HISTORY OF PULMONARY FIBROSIS (V17.6  Z83.6) VITAMIN D DEFICIENCY (268.9  E55.9) FATTY LIVER (571.8  K76.0)  Other Problems [name], MD; 03/23/2015 9:45 AM) Arthritis  Past Surgical History [name], MD; 03/23/2015 9:45 AM) Oral Surgery  Diagnostic Studies History [name], MD; 03/23/2015 9:45 AM) Mammogram never Colonoscopy never Pap Smear 1-5 years ago  Allergies Elbert Ewings, CMA; 03/23/2015 9:25 AM) No Known Drug Allergies03/05/2015  Medication History  [name], MD; 03/23/2015 9:45 AM) Hydrocod Polst-CPM Polst ER (10-8MG/5ML Liquid ER, Oral) Active. Levothyroxine Sodium (100MCG Tablet, Oral) Active. MetFORMIN HCl (500MG Tablet, Oral) Active. TRUEplus Lancets 30G Active. TRUEtest Test (In Vitro) Active. Vitamin D (Ergocalciferol) (50000UNIT Capsule, Oral) Active. Medications Reconciled OxyCODONE HCl (5MG/5ML Solution, 5-10 Milliliter Oral every four hours, as needed, Taken starting 03/23/2015) Active. Zofran ODT (4MG Tablet Disperse, 1 (one) Tablet Disperse Oral every six hours, as needed, Taken starting 03/23/2015) Active.  Social History [name], MD; 03/23/2015 9:45 AM) Tobacco use Never smoker. Alcohol use Occasional alcohol use. No drug use Caffeine use Carbonated beverages.  Family History [name], MD; 03/23/2015 9:45 AM) Thyroid problems Mother. Bleeding disorder Mother, Sister. Arthritis Mother. Cerebrovascular Accident Family Members In General. Breast Cancer Family Members In General. Alcohol Abuse Family Members In General. Migraine Headache Brother, Father, Sister. Kidney Disease Father. Respiratory Condition Mother. Prostate Cancer Father. Hypertension Mother. Diabetes Mellitus Family Members In General, Father, Mother. Depression Mother. Heart disease in female family member before age 26 Heart Disease Mother.  Pregnancy / Birth History [name], MD; 03/23/2015 9:45 AM) Contraceptive History Intrauterine device. Age at menarche 78 years. Maternal age 15-20 Gravida 1 Para 0 Regular periods  Review of Systems [name] Ronnie Derby, MD; 03/23/2015 9:43 00) General Not Present- Appetite Loss, Chills, Fatigue, Fever, Night Sweats, Weight Gain and Weight Loss. Skin Not Present- Change in Wart/Mole, Dryness, Hives, Jaundice, New Lesions, Non-Healing Wounds, Rash and Ulcer. HEENT Present- Wears glasses/contact lenses. Not Present- Earache, Hearing Loss, Hoarseness,  Nose Bleed, Oral Ulcers, Ringing in the Ears, Seasonal Allergies, Sinus Pain, Sore Throat, Visual Disturbances and Yellow Eyes. Respiratory Present- Snoring. Not Present- Bloody sputum, Chronic Cough, Difficulty  Breathing and Wheezing. Breast Not Present- Breast Mass, Breast Pain, Nipple Discharge and Skin Changes. Cardiovascular Not Present- Chest Pain, Difficulty Breathing Lying Down, Leg Cramps, Palpitations, Rapid Heart Rate, Shortness of Breath and Swelling of Extremities. Gastrointestinal Not Present- Abdominal Pain, Bloating, Bloody Stool, Change in Bowel Habits, Chronic diarrhea, Constipation, Difficulty Swallowing, Excessive gas, Gets full quickly at meals, Hemorrhoids, Indigestion, Nausea, Rectal Pain and Vomiting. Female Genitourinary Not Present- Frequency, Nocturia, Painful Urination, Pelvic Pain and Urgency. Musculoskeletal Present- Joint Pain and Joint Stiffness. Not Present- Back Pain, Muscle Pain, Muscle Weakness and Swelling of Extremities. Neurological Present- Headaches. Not Present- Decreased Memory, Fainting, Numbness, Seizures, Tingling, Tremor, Trouble walking and Weakness. Psychiatric Present- Depression. Not Present- Anxiety, Bipolar, Change in Sleep Pattern, Fearful and Frequent crying. Endocrine Present- New Diabetes. Not Present- Cold Intolerance, Excessive Hunger, Hair Changes, Heat Intolerance and Hot flashes.   Vitals Elbert Ewings CMA; 03/23/2015 9:26 AM) 03/23/2015 9:26 AM Weight: 347 lb Height: 61in Body Surface Area: 2.6 m Body Mass Index: 65.56 kg/m Temp.: 98.38F(Oral)  Pulse: 138 (Irregular)  Resp.: 17 (Unlabored)  BP: 142/80 (Sitting, Left Arm, Standard)    Physical Exam [name] M. Kyah Buesing MD; 03/23/2015 9:42 AM) General Mental Status-Alert. General Appearance-Consistent with stated age. Hydration-Well hydrated. Voice-Normal. Note: morbidly obese, evenly distributed.   Head and Neck Head-normocephalic, atraumatic with no  lesions or palpable masses. Trachea-midline. Thyroid Gland Characteristics - normal size and consistency.  Eye Eyeball - Bilateral-Extraocular movements intact. Sclera/Conjunctiva - Bilateral-No scleral icterus.  Chest and Lung Exam Chest and lung exam reveals -quiet, even and easy respiratory effort with no use of accessory muscles and on auscultation, normal breath sounds, no adventitious sounds and normal vocal resonance. Inspection Chest Wall - Normal. Back - normal.  Breast - Did not examine.  Cardiovascular Cardiovascular examination reveals -normal heart sounds, regular rate and rhythm with no murmurs and normal pedal pulses bilaterally.  Abdomen Inspection Inspection of the abdomen reveals - No Hernias. Skin - Scar - no surgical scars. Palpation/Percussion Palpation and Percussion of the abdomen reveal - Soft, Non Tender, No Rebound tenderness, No Rigidity (guarding) and No hepatosplenomegaly. Auscultation Auscultation of the abdomen reveals - Bowel sounds normal.  Peripheral Vascular Upper Extremity Palpation - Pulses bilaterally normal.  Neurologic Neurologic evaluation reveals -alert and oriented x 3 with no impairment of recent or remote memory. Mental Status-Normal.  Neuropsychiatric The patient's mood and affect are described as -normal. Judgment and Insight-insight is appropriate concerning matters relevant to self.  Musculoskeletal Normal Exam - Left-Upper Extremity Strength Normal and Lower Extremity Strength Normal. Normal Exam - Right-Upper Extremity Strength Normal and Lower Extremity Strength Normal. Note: Right knee crepitus   Lymphatic Head & Neck  General Head & Neck Lymphatics: Bilateral - Description - Normal. Axillary - Did not examine. Femoral & Inguinal - Did not examine.    Assessment & Plan [name] M. Milessa Hogan MD; 03/23/2015 9:46 AM) MORBID OBESITY WITH BMI OF 60.0-69.9, ADULT (278.01  E66.01) Impression: We  reviewed her preoperative workup. All of her questions were asked and answered. We discussed fatty liver disease. We discussed the importance of the preoperative diet. She was given her postoperative pain medicine and nausea medicine prescriptions today. She was encouraged to contact the office should she have any questions between now and surgery Current Plans  Pt Education - EMW_preopbariatric Started OxyCODONE HCl 5MG/5ML, 5-10 Milliliter every four hours, as needed, 200 Milliliter, 03/23/2015, No Refill. Started Zofran ODT 4MG, 1 (one) Tablet Disperse every six hours, as needed, #30, 03/23/2015, No  Refill. HISTORY OF PNEUMONIA (V12.61  Z87.01) ADULT HYPOTHYROIDISM (244.9  E03.9) DIABETES MELLITUS WITHOUT COMPLICATION (355.97  C16.3) ESSENTIAL HYPERTENSION (401.9  I10) FATTY LIVER (571.8  K76.0) VITAMIN D DEFICIENCY (268.9  E55.9)  Leighton Ruff. Redmond Pulling, MD, FACS General, Bariatric, & Minimally Invasive Surgery [facility], [place]

## 2015-03-29 NOTE — Interval H&P Note (Signed)
History and Physical Interval Note:  03/29/2015 7:12 AM  Lorraine Chambers  has presented today for surgery, with the diagnosis of Morbid Obesity  The various methods of treatment have been discussed with the patient and family. After consideration of risks, benefits and other options for treatment, the patient has consented to  Procedure(s): LAPAROSCOPIC GASTRIC SLEEVE RESECTION WITH UPPER ENDO (N/A) as a surgical intervention .  The patient's history has been reviewed, patient examined, no change in status, stable for surgery.  I have reviewed the patient's chart and labs.  Questions were answered to the patient's satisfaction.    Leighton Ruff. Redmond Pulling, MD, [place], Bariatric, & Minimally Invasive Surgery [facility], [place]   [facility]

## 2015-03-29 NOTE — Anesthesia Procedure Notes (Signed)
Procedure Name: Intubation Date/Time: 03/29/2015 7:24 AM Performed by: Macie Baum, Virgel Gess Pre-anesthesia Checklist: Patient identified, Emergency Drugs available, Suction available, Patient being monitored and Timeout performed Patient Re-evaluated:Patient Re-evaluated prior to inductionOxygen Delivery Method: Circle system utilized Preoxygenation: Pre-oxygenation with 100% oxygen Intubation Type: IV induction Ventilation: Mask ventilation without difficulty Laryngoscope Size: Mac and 3 Grade View: Grade II Tube type: Oral Tube size: 7.5 mm Number of attempts: 1 Airway Equipment and Method: Stylet Placement Confirmation: ETT inserted through vocal cords under direct vision,  positive ETCO2,  CO2 detector and breath sounds checked- equal and bilateral Secured at: 21 cm Tube secured with: Tape Dental Injury: Teeth and Oropharynx as per pre-operative assessment

## 2015-03-29 NOTE — Op Note (Signed)
03/29/2015 Lorraine Chambers [birth year] [id number]   PRE-OPERATIVE DIAGNOSIS:   Morbid obesity BMI 64 Diabetes mellitus type 2 Fatty liver Disease Hypertension  POST-OPERATIVE DIAGNOSIS:  same  PROCEDURE:  Procedure(s): LAPAROSCOPIC SLEEVE GASTRECTOMY  UPPER GI ENDOSCOPY  SURGEON:  Surgeon(s): [name], MD FACS FASMBS  ASSISTANTS: Alphonsa Overall, MD FACS  ANESTHESIA:   general  DRAINS: none   BOUGIE: 36 fr ViSiGi  LOCAL MEDICATIONS USED:  MARCAINE + Exparel  SPECIMEN:  Source of Specimen:  Greater curvature of stomach  DISPOSITION OF SPECIMEN:  PATHOLOGY  COUNTS:  YES  INDICATION FOR PROCEDURE: This is a very pleasant 29 year old morbidly obese female who has had unsuccessful attempts for sustained weight loss. She presents today for a planned laparoscopic sleeve gastrectomy with upper endoscopy. We have discussed the risk and benefits of the procedure extensively preoperatively. Please see my separate notes.  PROCEDURE: After obtaining informed consent and receiving 5000 units of subcutaneous heparin, the patient was brought to the operating room at [facility] and placed supine on the operating room table. General endotracheal anesthesia was established. Sequential compression devices were placed. A orogastric tube was placed. The patient's abdomen was prepped and draped in the usual standard surgical fashion. She received preoperative IV antibiotics. A surgical timeout was performed.  Access to the abdomen was achieved using a 5 mm 0 laparoscope thru a 5 mm trocar In the left upper Quadrant 2 fingerbreadths below the left subcostal margin using the Optiview technique. Pneumoperitoneum was smoothly established up to 15 mm of mercury. The laparoscope was advanced and the abdominal cavity was surveilled. The patient was then placed in reverse Trendelenburg. There was no evidence of a hiatal hernia on laparoscopy - gap in the left and right crus anteriorly.  A 5  mm trocar was placed slightly above and to the left of the umbilicus under direct visualization.  The [facility] liver retractor was placed under the left lobe of the liver through a 5 mm trocar incision site in the subxiphoid position. A 5 mm trocar was placed in the lateral right upper quadrant along with a 15 mm trocar in the mid right abdomen. A final 5 mm trocar was placed in the lateral LUQ.  All under direct visualization after local had been infiltrated.  The stomach was inspected. It was completely decompressed and the orogastric tube was removed. She had a fatty liver and a large left lobe that covered a lot of upper stomach and spleen.  There was no anterior dimple that was obviously visible. She had no hiatal hernia on a preoperative UGI.    We identified the pylorus and measured 6 cm proximal to the pylorus and identified an area of where we would start taking down the short gastric vessels. Harmonic scalpel was used to take down the short gastric vessels along the greater curvature of the stomach. We were able to enter the lesser sac. We continued to march along the greater curvature of the stomach taking down the short gastrics. As we approached the gastrosplenic ligament we took care in this area not to injure the spleen. We were able to take down the entire gastrosplenic ligament. We then mobilized the fundus away from the left crus of diaphragm. There were not any significant posterior gastric avascular attachments. This left the stomach completely mobilized. No vessels had been taken down along the lesser curvature of the stomach.  We then reidentified the pylorus. A 36Fr ViSiGi was then placed in the oropharynx and advanced  down into the stomach and placed in the distal antrum and positioned along the lesser curvature. It was placed under suction which secured the 36Fr ViSiGi in place along the lesser curve. Then using the Ethicon echelon 60 mm stapler with a black load with Seamguard, I  placed a stapler along the antrum approximately 5 cm from the pylorus. The stapler was angled so that there is ample room at the angularis incisura. I then fired the first staple load after inspecting it posteriorly to ensure adequate space both anteriorly and posteriorly. At this point I still was not completely past the angularis so with a green load with Seamguard, I placed the stapler in position just inside the prior stapleline. We then rotated the stomach to insure that there was adequate anteriorly as well as posteriorly. The stapler was then fired. I used another 5mm green cartridge with seamguard. At this point I started using 60 mm gold load staple cartridges with Seamguard. The echelon stapler was then repositioned with a 60 mm gold load with Seamguard and we continued to march up along the [place]. My assistant was holding traction along the greater curvature stomach along the cauterized short gastric vessels ensuring that the stomach was symmetrically retracted. Prior to each firing of the staple, we rotated the stomach to ensure that there is adequate stomach left.  As we approached the fundus, I used 60 mm blue cartridge with Seamguard aiming slightly lateral to the esophageal fat pad. Although the staples on this fire had completely gone thru the last part of the stomach it had not completely cut it. Therefore 1 additional 60 blue load was used to free the remaining stomach. The sleeve was inspected. There is no evidence of cork screw. The staple line appeared hemostatic. The CRNA inflated the ViSiGi to the green zone and the upper abdomen was flooded with saline. There were no bubbles. The sleeve was decompressed and the ViSiGi removed. My assistant scrubbed out and performed an upper endoscopy. The sleeve easily distended with air and the scope was easily advanced to the pylorus. There is no evidence of internal bleeding or cork screwing. There was no narrowing at the angularis. There is no  evidence of bubbles. Please see his operative note for further details. The gastric sleeve was decompressed and the endoscope was removed.  The greater curvature the stomach was grasped with a laparoscopic grasper and removed from the 15 mm trocar site.  The liver retractor was removed. I then closed the 15 mm trocar site with 1 interrupted 0 Vicryl sutures through the fascia using the endoclose. The closure was viewed laparoscopically and it was airtight. 70 cc of Exparel was then infiltrated in the preperitoneal spaces around the trocar sites. Pneumoperitoneum was released. All trocar sites were closed with a 4-0 Monocryl in a subcuticular fashion followed by the application of Dermabond. The patient was extubated and taken to the recovery room in stable condition. All needle, instrument, and sponge counts were correct x2. There are no immediate complications  (1) 60 mm gold with seamguard (2) 60 mm green with Seamguard (1) 60 mm gold with seamguard (2) 60 mm blue with 1 seamguard  PLAN OF CARE: Admit to inpatient   PATIENT DISPOSITION:  PACU - hemodynamically stable.   Delay start of Pharmacological VTE agent (>24hrs) due to surgical blood loss or risk of bleeding:  no  Leighton Ruff. Redmond Pulling, MD, FACS FASMBS General, Bariatric, & Minimally Invasive Surgery [facility], [place]

## 2015-03-29 NOTE — Anesthesia Postprocedure Evaluation (Signed)
  Anesthesia Post-op Note  Patient: Lorraine Chambers  Procedure(s) Performed: Procedure(s) (LRB): LAPAROSCOPIC GASTRIC SLEEVE RESECTION WITH UPPER ENDO (N/A) UPPER GI ENDOSCOPY  Patient Location: PACU  Anesthesia Type: General  Level of Consciousness: awake and alert   Airway and Oxygen Therapy: Patient Spontanous Breathing  Post-op Pain: mild  Post-op Assessment: Post-op Vital signs reviewed, Patient's Cardiovascular Status Stable, Respiratory Function Stable, Patent Airway and No signs of Nausea or vomiting  Last Vitals:  Filed Vitals:   03/29/15 1247  BP: 149/98  Pulse: 95  Temp: 36.6 C  Resp: 15    Post-op Vital Signs: stable   Complications: No apparent anesthesia complications

## 2015-03-29 NOTE — Op Note (Signed)
Name:  DANNA SEWELL MRN: [id number] Date of Surgery: 03/29/2015  Preop Diagnosis:  Morbid Obesity  Postop Diagnosis:  Morbid Obesity (Weight - 347, BMI - 65.6) , S/P Gastric Sleeve  Procedure:  Upper endoscopy  (Intraoperative)  Surgeon:  Alphonsa Overall, M.D.  Anesthesia:  GET  Indications for procedure: JANETH TERRY is a 29 y.o. female whose primary care physician is Gerrit Heck, MD and has completed a Gastric Sleeve today by Dr. Redmond Pulling.  I am doing an intraoperative upper endoscopy to evaluate the gastric pouch.  Operative Note: The patient is under general anesthesia.  Dr. Redmond Pulling is laparoscoping the patient while I do an upper endoscopy to evaluate the stomach pouch.  With the patient intubated, I passed the Pentax upper endoscope without difficulty down the esophagus.  The esophago-gastric junction was at 39 cm.    The mucosa of the stomach looked viable and the staple line was intact without bleeding.  I advanced to the pylorus, but did not go through it.  While I insufflated the stomach pouch with air, Dr. Redmond Pulling  flooded the upper abdomen with saline to put the gastric pouch under saline.  There was no bubbling or evidence of a leak.  Photos were taken of the gastric pouch.  There was no evidence of narrowing of the pouch and the gastric sleeve looked tubular.  The scope was then withdrawn.  The esophagus was unremarkable and the patient tolerated the endoscopy without difficulty.  Alphonsa Overall, MD, [facility] Pager: [phone] Office phone:  [phone]

## 2015-03-30 ENCOUNTER — Inpatient Hospital Stay (HOSPITAL_COMMUNITY): Payer: 59

## 2015-03-30 ENCOUNTER — Encounter (HOSPITAL_COMMUNITY): Payer: Self-pay | Admitting: General Surgery

## 2015-03-30 LAB — COMPREHENSIVE METABOLIC PANEL
ALT: 98 U/L — AB (ref 14–54)
ANION GAP: 9 (ref 5–15)
AST: 67 U/L — ABNORMAL HIGH (ref 15–41)
Albumin: 3.6 g/dL (ref 3.5–5.0)
Alkaline Phosphatase: 44 U/L (ref 38–126)
BUN: 7 mg/dL (ref 6–20)
CALCIUM: 8.9 mg/dL (ref 8.9–10.3)
CHLORIDE: 98 mmol/L — AB (ref 101–111)
CO2: 29 mmol/L (ref 22–32)
CREATININE: 0.56 mg/dL (ref 0.44–1.00)
Glucose, Bld: 124 mg/dL — ABNORMAL HIGH (ref 65–99)
Potassium: 4.1 mmol/L (ref 3.5–5.1)
SODIUM: 136 mmol/L (ref 135–145)
Total Bilirubin: 1.4 mg/dL — ABNORMAL HIGH (ref 0.3–1.2)
Total Protein: 7.3 g/dL (ref 6.5–8.1)

## 2015-03-30 LAB — CBC WITH DIFFERENTIAL/PLATELET
Basophils Absolute: 0 10*3/uL (ref 0.0–0.1)
Basophils Relative: 0 % (ref 0–1)
EOS ABS: 0 10*3/uL (ref 0.0–0.7)
EOS PCT: 0 % (ref 0–5)
HCT: 38.5 % (ref 36.0–46.0)
Hemoglobin: 11.4 g/dL — ABNORMAL LOW (ref 12.0–15.0)
LYMPHS ABS: 2.7 10*3/uL (ref 0.7–4.0)
LYMPHS PCT: 18 % (ref 12–46)
MCH: 23.6 pg — AB (ref 26.0–34.0)
MCHC: 29.6 g/dL — AB (ref 30.0–36.0)
MCV: 79.7 fL (ref 78.0–100.0)
MONO ABS: 0.8 10*3/uL (ref 0.1–1.0)
MONOS PCT: 5 % (ref 3–12)
Neutro Abs: 12 10*3/uL — ABNORMAL HIGH (ref 1.7–7.7)
Neutrophils Relative %: 77 % (ref 43–77)
PLATELETS: 345 10*3/uL (ref 150–400)
RBC: 4.83 MIL/uL (ref 3.87–5.11)
RDW: 13.9 % (ref 11.5–15.5)
WBC: 15.6 10*3/uL — ABNORMAL HIGH (ref 4.0–10.5)

## 2015-03-30 LAB — GLUCOSE, CAPILLARY
GLUCOSE-CAPILLARY: 106 mg/dL — AB (ref 65–99)
GLUCOSE-CAPILLARY: 143 mg/dL — AB (ref 65–99)
Glucose-Capillary: 129 mg/dL — ABNORMAL HIGH (ref 65–99)
Glucose-Capillary: 134 mg/dL — ABNORMAL HIGH (ref 65–99)
Glucose-Capillary: 106 mg/dL — ABNORMAL HIGH (ref 65–99)

## 2015-03-30 LAB — HEMOGLOBIN AND HEMATOCRIT, BLOOD
HCT: 38.5 % (ref 36.0–46.0)
HEMOGLOBIN: 11.6 g/dL — AB (ref 12.0–15.0)

## 2015-03-30 MED ORDER — IOHEXOL 300 MG/ML  SOLN: 50.0000 mL | Freq: Once | INTRAMUSCULAR | Status: DC | PRN

## 2015-03-30 MED ORDER — ALUM & MAG HYDROXIDE-SIMETH 200-200-20 MG/5ML PO SUSP
15.0000 mL | Freq: Four times a day (QID) | ORAL | Status: DC | PRN
  Filled 2015-03-30: qty 30

## 2015-03-30 MED ORDER — METOCLOPRAMIDE HCL 5 MG/ML IJ SOLN: 10.0000 mg | Freq: Three times a day (TID) | INTRAMUSCULAR | Status: DC | PRN

## 2015-03-30 NOTE — Care Management Note (Signed)
Case Management Note  Patient Details  Name: ARABELLE BOLLIG MRN: [id number] Date of Birth: 09-Jul-1986  Subjective/Objective:                 Admitted s/p Gastric Sleeve    Action/Plan: Discharge planning  Expected Discharge Date:                  Expected Discharge Plan:  Home/Self Care  In-House Referral:  NA  Discharge planning Services  CM Consult  Post Acute Care Choice:    Choice offered to:  NA  DME Arranged:  N/A DME Agency:  NA  HH Arranged:  NA HH Agency:  NA  Status of Service:  Completed, signed off  Medicare Important Message Given:    Date Medicare IM Given:    Medicare IM give by:    Date Additional Medicare IM Given:    Additional Medicare Important Message give by:     If discussed at [place] of Stay Meetings, dates discussed:    Additional Comments:  Guadalupe Maple, RN 03/30/2015, 10:32 AM

## 2015-03-30 NOTE — Progress Notes (Signed)
Patient alert and oriented, Post op day 1.  Provided support and encouragement.  Encouraged pulmonary toilet, ambulation and small sips of liquids when swallow study returned satisfactorily.  All questions answered.  Will continue to monitor. 

## 2015-03-30 NOTE — Progress Notes (Signed)
1 Day Post-Op  Subjective: Fair amount of nausea. Nausea worsened with sitting up. Threw up several times but brought up phlegm appearing matl. Pain not that bad. Nausea worse. Ambulated twice. IS up to 1250  Objective: Vital signs in last 24 hours: Temp:  [97.7 F (36.5 C)-100.2 F (37.9 C)] 98.9 F (37.2 C) (08/24 0542) Pulse Rate:  [72-97] 79 (08/24 0542) Resp:  [11-29] 18 (08/24 0542) BP: (113-169)/(61-98) 119/64 mmHg (08/24 0542) SpO2:  [90 %-100 %] 98 % (08/24 0542) Last BM Date: 03/29/15  Intake/Output from previous day: 08/23 0701 - 08/24 0700 In: 3766.7 [I.V.:3316.7; IV Piggyback:450] Out: 1750 [Urine:1725; Blood:25] Intake/Output this shift:    Asleep, easily arousable, approp cta b/l Reg Obese, soft, min TTP, incisions c/d/i +SCDs, no edema  Lab Results:   Recent Labs  03/29/15 0952 03/30/15 0534  WBC  --  15.6*  HGB 11.8* 11.4*  HCT 38.9 38.5  PLT  --  345   BMET  Recent Labs  03/30/15 0534  NA 136  K 4.1  CL 98*  CO2 29  GLUCOSE 124*  BUN 7  CREATININE 0.56  CALCIUM 8.9   PT/INR No results for input(s): LABPROT, INR in the last 72 hours. ABG No results for input(s): PHART, HCO3 in the last 72 hours.  Invalid input(s): PCO2, PO2  Studies/Results: No results found.  Anti-infectives: Anti-infectives    Start     Dose/Rate Route Frequency Ordered Stop   03/29/15 0616  cefoTEtan in Dextrose 5% (CEFOTAN) IVPB 2 g     2 g Intravenous On call to O.R. 03/29/15 1694 03/29/15 0735      Assessment/Plan: s/p Procedure(s): LAPAROSCOPIC GASTRIC SLEEVE RESECTION WITH UPPER ENDO (N/A) UPPER GI ENDOSCOPY  Nausea as expected - cont antiemetics, add reglan For UGI this am - if ok start water Blood sugars ok, cont SSI Cont chemical vte prophlyaxis - hgb ok  Leighton Ruff. Redmond Pulling, MD, FACS General, Bariatric, & Minimally Invasive Surgery [facility], [place]   LOS: 1 day    [name] 03/30/2015

## 2015-03-31 LAB — CBC WITH DIFFERENTIAL/PLATELET
BASOS ABS: 0 10*3/uL (ref 0.0–0.1)
BASOS PCT: 0 % (ref 0–1)
EOS ABS: 0.1 10*3/uL (ref 0.0–0.7)
EOS PCT: 1 % (ref 0–5)
HCT: 38.5 % (ref 36.0–46.0)
Hemoglobin: 11.5 g/dL — ABNORMAL LOW (ref 12.0–15.0)
Lymphocytes Relative: 24 % (ref 12–46)
Lymphs Abs: 3.3 10*3/uL (ref 0.7–4.0)
MCH: 23.9 pg — AB (ref 26.0–34.0)
MCHC: 29.9 g/dL — ABNORMAL LOW (ref 30.0–36.0)
MCV: 80 fL (ref 78.0–100.0)
Monocytes Absolute: 0.8 10*3/uL (ref 0.1–1.0)
Monocytes Relative: 6 % (ref 3–12)
Neutro Abs: 9.7 10*3/uL — ABNORMAL HIGH (ref 1.7–7.7)
Neutrophils Relative %: 69 % (ref 43–77)
PLATELETS: 359 10*3/uL (ref 150–400)
RBC: 4.81 MIL/uL (ref 3.87–5.11)
RDW: 13.9 % (ref 11.5–15.5)
WBC: 13.9 10*3/uL — AB (ref 4.0–10.5)

## 2015-03-31 LAB — GLUCOSE, CAPILLARY
GLUCOSE-CAPILLARY: 104 mg/dL — AB (ref 65–99)
GLUCOSE-CAPILLARY: 111 mg/dL — AB (ref 65–99)
GLUCOSE-CAPILLARY: 113 mg/dL — AB (ref 65–99)
Glucose-Capillary: 98 mg/dL (ref 65–99)
Glucose-Capillary: 108 mg/dL — ABNORMAL HIGH (ref 65–99)

## 2015-03-31 LAB — COMPREHENSIVE METABOLIC PANEL
ALBUMIN: 3.7 g/dL (ref 3.5–5.0)
ALT: 229 U/L — AB (ref 14–54)
AST: 160 U/L — AB (ref 15–41)
Alkaline Phosphatase: 43 U/L (ref 38–126)
Anion gap: 10 (ref 5–15)
BUN: 7 mg/dL (ref 6–20)
CHLORIDE: 100 mmol/L — AB (ref 101–111)
CO2: 28 mmol/L (ref 22–32)
CREATININE: 0.57 mg/dL (ref 0.44–1.00)
Calcium: 9 mg/dL (ref 8.9–10.3)
GFR calc non Af Amer: >60 mL/min (ref >60)
Glucose, Bld: 105 mg/dL — ABNORMAL HIGH (ref 65–99)
Potassium: 4.1 mmol/L (ref 3.5–5.1)
SODIUM: 138 mmol/L (ref 135–145)
Total Bilirubin: 1.6 mg/dL — ABNORMAL HIGH (ref 0.3–1.2)
Total Protein: 7.3 g/dL (ref 6.5–8.1)

## 2015-03-31 NOTE — Discharge Instructions (Signed)

## 2015-03-31 NOTE — Plan of Care (Signed)
Problem: Food- and Nutrition-Related Knowledge Deficit (NB-1.1) Goal: Nutrition education Formal process to instruct or train a patient/client in a skill or to impart knowledge to help patients/clients voluntarily manage or modify food choices and eating behavior to maintain or improve health. Outcome: Completed/Met Date Met:  03/31/15 Nutrition Education Note  Received consult for diet education per DROP protocol.   Discussed 2 week post op diet with pt. Emphasized that liquids must be non carbonated, non caffeinated, and sugar free. Fluid goals discussed. Pt to follow up with outpatient bariatric RD for further diet progression after 2 weeks. Multivitamins and minerals also reviewed. Teach back method used, pt expressed understanding, expect good compliance.   Diet: First 2 Weeks  You will see the nutritionist about two (2) weeks after your surgery. The nutritionist will increase the types of foods you can eat if you are handling liquids well:  If you have severe vomiting or nausea and cannot handle clear liquids lasting longer than 1 day, call your surgeon  Protein Shake  Drink at least 2 ounces of shake 5-6 times per day  Each serving of protein shakes (usually 8 - 12 ounces) should have a minimum of:  15 grams of protein  And no more than 5 grams of carbohydrate  Goal for protein each day:  Men = 80 grams per day  Women = 60 grams per day  Protein powder may be added to fluids such as non-fat milk or Lactaid milk or Soy milk (limit to 35 grams added protein powder per serving)   Hydration  Slowly increase the amount of water and other clear liquids as tolerated (See Acceptable Fluids)  Slowly increase the amount of protein shake as tolerated  Sip fluids slowly and throughout the day  May use sugar substitutes in small amounts (no more than 6 - 8 packets per day; i.e. Splenda)   Fluid Goal  The first goal is to drink at least 8 ounces of protein shake/drink per day (or as directed  by the nutritionist); some examples of protein shakes are Syntrax Nectar, Adkins Advantage, EAS Edge HP, and Unjury. See handout from pre-op Bariatric Education Class:  Slowly increase the amount of protein shake you drink as tolerated  You may find it easier to slowly sip shakes throughout the day  It is important to get your proteins in first  Your fluid goal is to drink 64 - 100 ounces of fluid daily  It may take a few weeks to build up to this  32 oz (or more) should be clear liquids  And  32 oz (or more) should be full liquids (see below for examples)  Liquids should not contain sugar, caffeine, or carbonation   Clear Liquids:  Water or Sugar-free flavored water (i.e. Fruit H2O, Propel)  Decaffeinated coffee or tea (sugar-free)  Crystal Lite, Wyler's Lite, Minute Maid Lite  Sugar-free Jell-O  Bouillon or broth  Sugar-free Popsicle: *Less than 20 calories each; Limit 1 per day   Full Liquids:  Protein Shakes/Drinks + 2 choices per day of other full liquids  Full liquids must be:  No More Than 12 grams of Carbs per serving  No More Than 3 grams of Fat per serving  Strained low-fat cream soup  Non-Fat milk  Fat-free Lactaid Milk  Sugar-free yogurt (Dannon Lite & Fit, Greek yogurt)     Lus Kriegel, MS, RD, LDN Pager: [phone] After Hours Pager: [phone]        

## 2015-03-31 NOTE — Progress Notes (Signed)
Patient alert and oriented, pain is controlled. Patient is tolerating fluids, advanced to protein shake today, patient had some difficulty with volume today, as well as nausea, decided it was best to stay one more night to increase fluid volume.  Reviewed Gastric sleeve discharge instructions with patient and patient is able to articulate understanding.  Provided information on BELT program, Support Group and [facility] outpatient pharmacy. All questions answered, will continue to monitor.

## 2015-03-31 NOTE — Progress Notes (Signed)
2 Days Post-Op  Subjective: A little nausea. 1 episode of emesis yesterday - small volume. Min pain. Ambulated 4x, did IS  Objective: Vital signs in last 24 hours: Temp:  [98.4 F (36.9 C)-99.6 F (37.6 C)] 99 F (37.2 C) (08/25 0523) Pulse Rate:  [76-83] 79 (08/25 0523) Resp:  [18-20] 20 (08/25 0523) BP: (104-153)/(63-92) 137/71 mmHg (08/25 0523) SpO2:  [95 %-98 %] 95 % (08/25 0523) Last BM Date: 03/29/15  Intake/Output from previous day: 08/24 0701 - 08/25 0700 In: 3587.1 [P.O.:300; I.V.:3027.1; IV Piggyback:260] Out: 2025 [Urine:2025] Intake/Output this shift:    Alert, sitting on edge of bed cta b/l Reg Obese, soft, nt, incisions c/d/i No edema  Lab Results:   Recent Labs  03/30/15 0534 03/30/15 1600 03/31/15 0542  WBC 15.6*  --  13.9*  HGB 11.4* 11.6* 11.5*  HCT 38.5 38.5 38.5  PLT 345  --  359   BMET  Recent Labs  03/30/15 0534 03/31/15 0542  NA 136 138  K 4.1 4.1  CL 98* 100*  CO2 29 28  GLUCOSE 124* 105*  BUN 7 7  CREATININE 0.56 0.57  CALCIUM 8.9 9.0   PT/INR No results for input(s): LABPROT, INR in the last 72 hours. ABG No results for input(s): PHART, HCO3 in the last 72 hours.  Invalid input(s): PCO2, PO2  Studies/Results: Dg Ugi W/water Sol Cm  03/30/2015   CLINICAL DATA:  Postop gastric sleeve yesterday.  EXAM: WATER SOLUBLE UPPER GI SERIES  TECHNIQUE: Single-column upper GI series was performed using water soluble contrast.  CONTRAST:  50 cc Omnipaque.  COMPARISON:  11/10/2013.  FLUOROSCOPY TIME:  Radiation Exposure Index (as provided by the fluoroscopic device):  If the device does not provide the exposure index:  Fluoroscopy Time (in minutes and seconds):  0 minutes 44 seconds.  Number of Acquired Images:  3  FINDINGS: Scout view of the abdomen shows a normal bowel gas pattern. Stool is seen in the descending colon.  Patient drank 50 cc Omnipaque. Contrast flows readily into the residual stomach and proximal small bowel. No leak.   IMPRESSION: Normal postoperative day 1 appearance of gastric sleeve procedure.   Electronically Signed   By: Lorin Picket M.D.   On: 03/30/2015 11:17    Anti-infectives: Anti-infectives    Start     Dose/Rate Route Frequency Ordered Stop   03/29/15 0616  cefoTEtan in Dextrose 5% (CEFOTAN) IVPB 2 g     2 g Intravenous On call to O.R. 03/29/15 5916 03/29/15 0735      Assessment/Plan: s/p Procedure(s): LAPAROSCOPIC GASTRIC SLEEVE RESECTION WITH UPPER ENDO (N/A) UPPER GI ENDOSCOPY  No fever. No tachycardia. Adv to POD 2 diet If takes in adequate PO, believe she is a candidate for dc later today Discussed dc instructions.  Discussed importance of getting up/moving up.   Leighton Ruff. Redmond Pulling, MD, FACS General, Bariatric, & Minimally Invasive Surgery [facility], [place]   LOS: 2 days    [name] 03/31/2015

## 2015-04-01 LAB — GLUCOSE, CAPILLARY
Glucose-Capillary: 76 mg/dL (ref 65–99)
Glucose-Capillary: 91 mg/dL (ref 65–99)
Glucose-Capillary: 84 mg/dL (ref 65–99)

## 2015-04-01 MED ORDER — OXYCODONE HCL 5 MG/5ML PO SOLN: 5.0000 mg | ORAL | Status: DC | PRN

## 2015-04-01 MED ORDER — PANTOPRAZOLE SODIUM 40 MG PO TBEC: 40.0000 mg | DELAYED_RELEASE_TABLET | Freq: Every day | ORAL | Status: DC

## 2015-04-01 NOTE — Discharge Summary (Signed)
Physician Discharge Summary  Lorraine Chambers EUM:[id number] DOB: [birth year] DOA: 03/29/2015  PCP: Gerrit Heck, MD  Admit date: 03/29/2015 Discharge date: 04/01/2015  Recommendations for Outpatient Follow-up:   Follow-up Information    Follow up with [name], MD. Go on 04/07/2015.   Specialty:  General Surgery   Why:  For Post-Op Check at 9:30   Contact information:   [place] [id number] [phone]      Discharge Diagnoses:  Principal Problem:   Morbid obesity with BMI of 60.0-69.9, adult Active Problems:   HYPERTENSION, BENIGN ESSENTIAL   DM (diabetes mellitus), type 2   Fatty liver disease, nonalcoholic   S/P laparoscopic sleeve gastrectomy   Surgical Procedure: Laparoscopic Sleeve Gastrectomy, upper endoscopy  Discharge Condition: Good Disposition: Home  Diet recommendation: Postoperative sleeve gastrectomy diet (liquids only)  Filed Weights   03/29/15 0719  Weight: 155.584 kg (343 lb)     Hospital Course:  The patient was admitted for a planned laparoscopic sleeve gastrectomy. Please see operative note. Preoperatively the patient was given 5000 units of subcutaneous heparin for DVT prophylaxis. Postoperative prophylactic Lovenox dosing was started on the morning of postoperative day 1. The patient underwent an upper GI on postoperative day 1 which demonstrated no extravasation of contrast and emptying of the contrast into the small bowel. The patient was started on ice chips and water which they tolerated. On postoperative day 2 The patient's diet was advanced to protein shakes which they also tolerated however her volume intake was not safe for discharge. On POD 3 she was tolerating her shakes and taking in more volume. Her nausea had greatly improved. . The patient was ambulating without difficulty. Their vital signs are stable without fever or tachycardia. Their hemoglobin had remained stable.  The patient had  received discharge instructions and counseling. They were deemed stable for discharge.  BP 106/57 mmHg  Pulse 91  Temp(Src) 98.5 F (36.9 C) (Oral)  Resp 20  Ht 5\' 1"  (1.549 m)  Wt 155.584 kg (343 lb)  BMI 64.84 kg/m2  SpO2 95%  LMP 03/11/2015  Gen: alert, NAD, non-toxic appearing, looks great Pupils: equal, no scleral icterus Pulm: Lungs clear to auscultation, symmetric chest rise CV: regular rate and rhythm Abd: soft, nontender, nondistended.  No cellulitis. No incisional hernia Ext: no edema, no calf tenderness Skin: no rash, no jaundice  Discharge Instructions  Discharge Instructions    Ambulate hourly while awake    Complete by:  As directed      Call MD for:  difficulty breathing, headache or visual disturbances    Complete by:  As directed      Call MD for:  persistant dizziness or light-headedness    Complete by:  As directed      Call MD for:  persistant nausea and vomiting    Complete by:  As directed      Call MD for:  redness, tenderness, or signs of infection (pain, swelling, redness, odor or green/yellow discharge around incision site)    Complete by:  As directed      Call MD for:  severe uncontrolled pain    Complete by:  As directed      Call MD for:  temperature >101 F    Complete by:  As directed      Diet bariatric full liquid    Complete by:  As directed      Discharge instructions    Complete by:  As directed  See bariatric discharge instructions     Incentive spirometry    Complete by:  As directed   Perform hourly while awake            Medication List    STOP taking these medications        metFORMIN 500 MG tablet  Commonly known as:  GLUCOPHAGE      TAKE these medications        acetaminophen 500 MG tablet  Commonly known as:  TYLENOL  Take 1,000 mg by mouth every 6 (six) hours as needed for moderate pain.     ergocalciferol 50000 UNITS capsule  Commonly known as:  VITAMIN D2  Take 50,000 Units by mouth every Wednesday.      levothyroxine 100 MCG tablet  Commonly known as:  SYNTHROID, LEVOTHROID  Take 100 mcg by mouth daily before breakfast.     oxyCODONE 5 MG/5ML solution  Commonly known as:  ROXICODONE  Take 5-10 mLs (5-10 mg total) by mouth every 4 (four) hours as needed for moderate pain or severe pain.     pantoprazole 40 MG tablet  Commonly known as:  PROTONIX  Take 1 tablet (40 mg total) by mouth daily.     SKYLA IU  1 each by Intrauterine route continuous.           Follow-up Information    Follow up with [name], MD. Go on 04/07/2015.   Specialty:  General Surgery   Why:  For Post-Op Check at 9:30   Contact information:   [place] [id number] [phone]        The results of significant diagnostics from this hospitalization (including imaging, microbiology, ancillary and laboratory) are listed below for reference.    Significant Diagnostic Studies: Dg Chest 2 View  03/22/2015   CLINICAL DATA:  Preoperative exam prior to gastric sleeve surgery, nonsmoker.  EXAM: CHEST  2 VIEW  COMPARISON:  Chest x-ray of November 11, 2014  FINDINGS: The lungs are mildly hypoinflated but clear. The cardiac silhouette is mildly enlarged. The pulmonary vascularity is normal. The mediastinum is normal in width. There is no pleural effusion. The bony thorax exhibits no acute abnormality.  IMPRESSION: No active cardiopulmonary disease. There is mild stable enlargement of the cardiac silhouette.   Electronically Signed   By: David  Martinique M.D.   On: 03/22/2015 11:44   Dg Duanne Limerick W/water Sol Cm  03/30/2015   CLINICAL DATA:  Postop gastric sleeve yesterday.  EXAM: WATER SOLUBLE UPPER GI SERIES  TECHNIQUE: Single-column upper GI series was performed using water soluble contrast.  CONTRAST:  50 cc Omnipaque.  COMPARISON:  11/10/2013.  FLUOROSCOPY TIME:  Radiation Exposure Index (as provided by the fluoroscopic device):  If the device does not provide the exposure index:  Fluoroscopy Time (in  minutes and seconds):  0 minutes 44 seconds.  Number of Acquired Images:  3  FINDINGS: Scout view of the abdomen shows a normal bowel gas pattern. Stool is seen in the descending colon.  Patient drank 50 cc Omnipaque. Contrast flows readily into the residual stomach and proximal small bowel. No leak.  IMPRESSION: Normal postoperative day 1 appearance of gastric sleeve procedure.   Electronically Signed   By: Lorin Picket M.D.   On: 03/30/2015 11:17    Labs: Basic Metabolic Panel:  Recent Labs Lab 03/30/15 0534 03/31/15 0542  NA 136 138  K 4.1 4.1  CL 98* 100*  CO2 29 28  GLUCOSE  124* 105*  BUN 7 7  CREATININE 0.56 0.57  CALCIUM 8.9 9.0   Liver Function Tests:  Recent Labs Lab 03/30/15 0534 03/31/15 0542  AST 67* 160*  ALT 98* 229*  ALKPHOS 44 43  BILITOT 1.4* 1.6*  PROT 7.3 7.3  ALBUMIN 3.6 3.7    CBC:  Recent Labs Lab 03/29/15 0952 03/30/15 0534 03/30/15 1600 03/31/15 0542  WBC  --  15.6*  --  13.9*  NEUTROABS  --  12.0*  --  9.7*  HGB 11.8* 11.4* 11.6* 11.5*  HCT 38.9 38.5 38.5 38.5  MCV  --  79.7  --  80.0  PLT  --  345  --  359    CBG:  Recent Labs Lab 03/31/15 1148 03/31/15 1610 03/31/15 2011 04/01/15 0807 04/01/15 1200  GLUCAP 111* 108* 113* 91 84    Principal Problem:   Morbid obesity with BMI of 60.0-69.9, adult Active Problems:   HYPERTENSION, BENIGN ESSENTIAL   DM (diabetes mellitus), type 2   Fatty liver disease, nonalcoholic   S/P laparoscopic sleeve gastrectomy   Time coordinating discharge: 10 minutes  Signed:  [name], MD [facility], [place] [phone] 04/01/2015, 1:01 PM

## 2015-04-04 ENCOUNTER — Telehealth (HOSPITAL_COMMUNITY): Payer: Self-pay

## 2015-04-04 NOTE — Telephone Encounter (Signed)
Made discharge phone call to patient per DROP protocol. Asking the following questions.    1. Do you have someone to care for you now that you are home?  yes 2. Are you having pain now that is not relieved by your pain medication?  no 3. Are you able to drink the recommended daily amount of fluids (48 ounces minimum/day) and protein (60-80 grams/day) as prescribed by the dietitian or nutritional counselor?  Not quite getting it yet, but I am working on it.   4. Are you taking the vitamins and minerals as prescribed?  yes 5. Do you have the "on call" number to contact your surgeon if you have a problem or question?  yes 6. Are your incisions free of redness, swelling or drainage? (If steri strips, address that these can fall off, shower as tolerated) yes 7. Have your bowels moved since your surgery?  If not, are you passing gas?  Yes 8. Are you up and walking 3-4 times per day?  yes

## 2015-04-07 ENCOUNTER — Encounter (HOSPITAL_COMMUNITY): Payer: Self-pay | Admitting: General Surgery

## 2015-04-12 ENCOUNTER — Encounter: Payer: 59 | Attending: General Surgery

## 2015-04-12 VITALS — Ht 61.0 in | Wt 323.5 lb

## 2015-04-12 DIAGNOSIS — Z6841 Body Mass Index (BMI) 40.0 and over, adult: Secondary | ICD-10-CM | POA: Diagnosis not present

## 2015-04-12 DIAGNOSIS — Z713 Dietary counseling and surveillance: Secondary | ICD-10-CM | POA: Diagnosis not present

## 2015-04-12 DIAGNOSIS — Z01818 Encounter for other preprocedural examination: Secondary | ICD-10-CM | POA: Insufficient documentation

## 2015-04-12 NOTE — Progress Notes (Signed)
Bariatric Class:  Appt start time: 1530 end time:  1630.  2 Week Post-Operative Nutrition Class  Patient was seen on 04/12/2015 for Post-Operative Nutrition education at [facility].   Surgery date: 03/29/2015 Surgery type: Sleeve Gastrectomy [facility] weight at [facility]: 352.5 lbs on 11/04/14 Weight today: 323.5 lbs  Weight change: 27.5 lbs  TANITA  BODY COMP RESULTS  02/14/15 04/12/15   BMI (kg/m^2) 66.3 61.1   Fat Mass (lbs) 188 185.5   Fat Free Mass (lbs) 163 138.0   Total Body Water (lbs) 119.5 101.0    The following the learning objectives were met by the patient during this course:  Identifies Phase 3A (Soft, High Proteins) Dietary Goals and will begin from 2 weeks post-operatively to 2 months post-operatively  Identifies appropriate sources of fluids and proteins   States protein recommendations and appropriate sources post-operatively  Identifies the need for appropriate texture modifications, mastication, and bite sizes when consuming solids  Identifies appropriate multivitamin and calcium sources post-operatively  Describes the need for physical activity post-operatively and will follow MD recommendations  States when to call healthcare provider regarding medication questions or post-operative complications  Handouts given during class include:  Phase 3A: Soft, High Protein Diet Handout  Follow-Up Plan: Patient will follow-up at [facility] in 6 weeks for 2 month post-op nutrition visit for diet advancement per MD.

## 2015-04-24 ENCOUNTER — Encounter (HOSPITAL_COMMUNITY): Payer: Self-pay

## 2015-04-24 ENCOUNTER — Emergency Department (HOSPITAL_COMMUNITY): Payer: 59

## 2015-04-24 ENCOUNTER — Emergency Department (HOSPITAL_COMMUNITY)
Admission: EM | Admit: 2015-04-24 | Discharge: 2015-04-24 | Disposition: A | Discharge status: Home / Self Care | Payer: 59 | Attending: Emergency Medicine | Admitting: Emergency Medicine

## 2015-04-24 DIAGNOSIS — Z8701 Personal history of pneumonia (recurrent): Secondary | ICD-10-CM | POA: Diagnosis not present

## 2015-04-24 DIAGNOSIS — I10 Essential (primary) hypertension: Secondary | ICD-10-CM | POA: Insufficient documentation

## 2015-04-24 DIAGNOSIS — R011 Cardiac murmur, unspecified: Secondary | ICD-10-CM | POA: Insufficient documentation

## 2015-04-24 DIAGNOSIS — R197 Diarrhea, unspecified: Secondary | ICD-10-CM | POA: Diagnosis not present

## 2015-04-24 DIAGNOSIS — R1110 Vomiting, unspecified: Secondary | ICD-10-CM | POA: Diagnosis not present

## 2015-04-24 DIAGNOSIS — Z79899 Other long term (current) drug therapy: Secondary | ICD-10-CM | POA: Insufficient documentation

## 2015-04-24 DIAGNOSIS — R1084 Generalized abdominal pain: Secondary | ICD-10-CM | POA: Diagnosis not present

## 2015-04-24 DIAGNOSIS — Z862 Personal history of diseases of the blood and blood-forming organs and certain disorders involving the immune mechanism: Secondary | ICD-10-CM | POA: Insufficient documentation

## 2015-04-24 DIAGNOSIS — R1010 Upper abdominal pain, unspecified: Secondary | ICD-10-CM | POA: Diagnosis present

## 2015-04-24 DIAGNOSIS — E119 Type 2 diabetes mellitus without complications: Secondary | ICD-10-CM | POA: Diagnosis not present

## 2015-04-24 DIAGNOSIS — Z872 Personal history of diseases of the skin and subcutaneous tissue: Secondary | ICD-10-CM | POA: Diagnosis not present

## 2015-04-24 DIAGNOSIS — Z3202 Encounter for pregnancy test, result negative: Secondary | ICD-10-CM | POA: Diagnosis not present

## 2015-04-24 DIAGNOSIS — K219 Gastro-esophageal reflux disease without esophagitis: Secondary | ICD-10-CM | POA: Diagnosis not present

## 2015-04-24 LAB — CBC
HCT: 40.8 % (ref 36.0–46.0)
Hemoglobin: 12.5 g/dL (ref 12.0–15.0)
MCH: 24.1 pg — AB (ref 26.0–34.0)
MCHC: 30.6 g/dL (ref 30.0–36.0)
MCV: 78.8 fL (ref 78.0–100.0)
PLATELETS: 340 10*3/uL (ref 150–400)
RBC: 5.18 MIL/uL — ABNORMAL HIGH (ref 3.87–5.11)
RDW: 14.3 % (ref 11.5–15.5)
WBC: 8.5 10*3/uL (ref 4.0–10.5)

## 2015-04-24 LAB — URINALYSIS, ROUTINE W REFLEX MICROSCOPIC
GLUCOSE, UA: NEGATIVE mg/dL
HGB URINE DIPSTICK: NEGATIVE
Nitrite: NEGATIVE
PROTEIN: 30 mg/dL — AB
Specific Gravity, Urine: 1.033 — ABNORMAL HIGH (ref 1.005–1.030)
Urobilinogen, UA: 1 mg/dL (ref 0.0–1.0)
pH: 6 (ref 5.0–8.0)

## 2015-04-24 LAB — COMPREHENSIVE METABOLIC PANEL
ALK PHOS: 51 U/L (ref 38–126)
ALT: 121 U/L — AB (ref 14–54)
AST: 69 U/L — ABNORMAL HIGH (ref 15–41)
Albumin: 3.8 g/dL (ref 3.5–5.0)
Anion gap: 13 (ref 5–15)
BUN: 5 mg/dL — ABNORMAL LOW (ref 6–20)
CALCIUM: 9.4 mg/dL (ref 8.9–10.3)
CO2: 27 mmol/L (ref 22–32)
CREATININE: 0.49 mg/dL (ref 0.44–1.00)
Chloride: 100 mmol/L — ABNORMAL LOW (ref 101–111)
GFR calc non Af Amer: >60 mL/min (ref >60)
GLUCOSE: 100 mg/dL — AB (ref 65–99)
Potassium: 3.4 mmol/L — ABNORMAL LOW (ref 3.5–5.1)
SODIUM: 140 mmol/L (ref 135–145)
Total Bilirubin: 2.3 mg/dL — ABNORMAL HIGH (ref 0.3–1.2)
Total Protein: 7.8 g/dL (ref 6.5–8.1)

## 2015-04-24 LAB — LIPASE, BLOOD: Lipase: 27 U/L (ref 22–51)

## 2015-04-24 LAB — URINE MICROSCOPIC-ADD ON

## 2015-04-24 LAB — I-STAT BETA HCG BLOOD, ED (MC, WL, AP ONLY): I-stat hCG, quantitative: <5 m[IU]/mL (ref <5)

## 2015-04-24 MED ORDER — METOCLOPRAMIDE HCL 5 MG/ML IJ SOLN
10.0000 mg | Freq: Once | INTRAMUSCULAR | Status: AC
  Administered 2015-04-24: 10 mg via INTRAVENOUS
  Filled 2015-04-24: qty 2

## 2015-04-24 MED ORDER — IOHEXOL 300 MG/ML  SOLN
100.0000 mL | Freq: Once | INTRAMUSCULAR | Status: AC | PRN
  Administered 2015-04-24: 100 mL via INTRAVENOUS

## 2015-04-24 MED ORDER — ONDANSETRON HCL 4 MG/2ML IJ SOLN
4.0000 mg | Freq: Once | INTRAMUSCULAR | Status: DC
  Filled 2015-04-24: qty 2

## 2015-04-24 MED ORDER — SODIUM CHLORIDE 0.9 % IV BOLUS (SEPSIS)
1000.0000 mL | Freq: Once | INTRAVENOUS | Status: AC
  Administered 2015-04-24: 1000 mL via INTRAVENOUS

## 2015-04-24 MED ORDER — IOHEXOL 300 MG/ML  SOLN
25.0000 mL | Freq: Once | INTRAMUSCULAR | Status: AC | PRN
  Administered 2015-04-24: 25 mL via ORAL

## 2015-04-24 MED ORDER — PROMETHAZINE HCL 25 MG RE SUPP: 25.0000 mg | Freq: Four times a day (QID) | RECTAL | Status: DC | PRN

## 2015-04-24 NOTE — ED Notes (Signed)
Patient states she had a gastric sleeve on 03/29/15. Patient states she ate some meat which she has been tolerating without issues, but after working out 2 days she has had intermittent N/V/D. Patient states she has been unable to tolerate liquids.

## 2015-04-24 NOTE — ED Notes (Signed)
Patient was alert, oriented and stable upon discharge. RN went over AVS and patient had no further questions.  

## 2015-04-24 NOTE — ED Provider Notes (Signed)
CSN: [id number]     Arrival date & time 04/24/15  1723 History   First MD Initiated Contact with Patient 04/24/15 1809     Chief Complaint  Patient presents with  . Emesis  . Diarrhea     (Consider location/radiation/quality/duration/timing/severity/associated sxs/prior Treatment) HPI Comments: Patient presents to the ER for evaluation of upper abdominal pain with nausea and vomiting. Patient had gastric sleeve surgery performed on August 23. She has been having difficulty getting in all of her nutrition and fluids, but has follow-up with her doctor and it felt that her recovery had been doing well. 2 days ago, however, she went to the gym and workout with weights. While working out with weights she started to have pain in her upper abdomen and 30 minutes later vomited. Since then she has not been able to eat or drink because of pain, nausea and vomiting.  Patient is a 29 y.o. female presenting with vomiting and diarrhea.  Emesis Associated symptoms: abdominal pain and diarrhea   Diarrhea Associated symptoms: abdominal pain and vomiting     Past Medical History  Diagnosis Date  . Arthritis   . Hypertension     Pt reports episode of HTN in 2009  . Thyroid disease   . Hydradenitis 02/09/2014  . Iron deficiency anemia, unspecified 02/09/2014  . Diabetes mellitus without complication   . Hypothyroidism 08/23/2014  . DM (diabetes mellitus), type 2 08/23/2014  . Heart murmur   . Pneumonia   . GERD (gastroesophageal reflux disease)   . Headache    Past Surgical History  Procedure Laterality Date  . Wisdom tooth extraction    . Laparoscopic gastric sleeve resection N/A 03/29/2015    Procedure: LAPAROSCOPIC GASTRIC SLEEVE RESECTION WITH UPPER ENDO;  Surgeon: Greer Pickerel, MD;  Location: [facility];  Service: General;  Laterality: N/A;  . Upper gi endoscopy  03/29/2015    Procedure: UPPER GI ENDOSCOPY;  Surgeon: Greer Pickerel, MD;  Location: [facility];  Service: General;;   Family History  Problem  Relation Age of Onset  . Stroke Other   . Heart failure Other   . Heart disease Mother   . Diabetes Mother   . Hypertension Mother   . Cancer Maternal Uncle     brain ca  . Diabetes Father   . Arthritis Father   . Other Father   . Pulmonary fibrosis Mother   . Pulmonary fibrosis Maternal Grandmother    Social History  Substance Use Topics  . Smoking status: Never Smoker   . Smokeless tobacco: Never Used  . Alcohol Use: Yes     Comment: occassional   OB History    No data available     Review of Systems  Gastrointestinal: Positive for vomiting, abdominal pain and diarrhea.  All other systems reviewed and are negative.     Allergies  Bee venom  Home Medications   Prior to Admission medications   Medication Sig Start Date End Date Taking? Authorizing Provider  acetaminophen (TYLENOL) 500 MG tablet Take 1,000 mg by mouth every 6 (six) hours as needed for moderate pain.   Yes Historical Provider, MD  oxyCODONE (ROXICODONE) 5 MG/5ML solution Take 5-10 mLs (5-10 mg total) by mouth every 4 (four) hours as needed for moderate pain or severe pain. 04/01/15  Yes Greer Pickerel, MD  pantoprazole (PROTONIX) 40 MG tablet Take 1 tablet (40 mg total) by mouth daily. 04/01/15  Yes Greer Pickerel, MD  Levonorgestrel (SKYLA IU) 1 each by Intrauterine route continuous.  Historical Provider, MD   BP 128/75 mmHg  Pulse 82  Temp(Src) 98.5 F (36.9 C) (Oral)  Resp 20  Ht 5' 3.75" (1.619 m)  Wt 315 lb 2 oz (142.94 kg)  BMI 54.53 kg/m2  SpO2 99%  LMP 04/08/2015 Physical Exam  Constitutional: She is oriented to person, place, and time. She appears well-developed and well-nourished. No distress.  HENT:  Head: Normocephalic and atraumatic.  Right Ear: Hearing normal.  Left Ear: Hearing normal.  Nose: Nose normal.  Mouth/Throat: Oropharynx is clear and moist and mucous membranes are normal.  Eyes: Conjunctivae and EOM are normal. Pupils are equal, round, and reactive to light.  Neck:  Normal range of motion. Neck supple.  Cardiovascular: Regular rhythm, S1 normal and S2 normal.  Exam reveals no gallop and no friction rub.   No murmur heard. Pulmonary/Chest: Effort normal and breath sounds normal. No respiratory distress. She exhibits no tenderness.  Abdominal: Soft. Normal appearance and bowel sounds are normal. There is no hepatosplenomegaly. There is tenderness in the epigastric area and left upper quadrant. There is no rebound, no guarding, no tenderness at McBurney's point and negative Murphy's sign. No hernia.  Musculoskeletal: Normal range of motion.  Neurological: She is alert and oriented to person, place, and time. She has normal strength. No cranial nerve deficit or sensory deficit. Coordination normal. GCS eye subscore is 4. GCS verbal subscore is 5. GCS motor subscore is 6.  Skin: Skin is warm, dry and intact. No rash noted. No cyanosis.  Psychiatric: She has a normal mood and affect. Her speech is normal and behavior is normal. Thought content normal.  Nursing note and vitals reviewed.   ED Course  Procedures (including critical care time) Labs Review Labs Reviewed  COMPREHENSIVE METABOLIC PANEL - Abnormal; Notable for the following:    Potassium 3.4 (*)    Chloride 100 (*)    Glucose, Bld 100 (*)    BUN 5 (*)    AST 69 (*)    ALT 121 (*)    Total Bilirubin 2.3 (*)    All other components within normal limits  CBC - Abnormal; Notable for the following:    RBC 5.18 (*)    MCH 24.1 (*)    All other components within normal limits  URINALYSIS, ROUTINE W REFLEX MICROSCOPIC (NOT AT [facility]) - Abnormal; Notable for the following:    Color, Urine ORANGE (*)    APPearance CLOUDY (*)    Specific Gravity, Urine 1.033 (*)    Bilirubin Urine MODERATE (*)    Ketones, ur >80 (*)    Protein, ur 30 (*)    Leukocytes, UA SMALL (*)    All other components within normal limits  URINE MICROSCOPIC-ADD ON - Abnormal; Notable for the following:    Squamous Epithelial /  LPF FEW (*)    All other components within normal limits  LIPASE, BLOOD  I-STAT BETA HCG BLOOD, ED ([facility], [facility], [facility] ONLY)    Imaging Review Ct Abdomen Pelvis W Contrast  04/24/2015   CLINICAL DATA:  Patient states she had a gastric sleeve on 03/29/15. Patient states she ate some meat which she has been tolerating without issues, but after working out 2 days she has had intermittent N/V/D. Patient states she has been unable to tolerate liquids  EXAM: CT ABDOMEN AND PELVIS WITH CONTRAST  TECHNIQUE: Multidetector CT imaging of the abdomen and pelvis was performed using the standard protocol following bolus administration of intravenous contrast.  CONTRAST:  63mL OMNIPAQUE  IOHEXOL 300 MG/ML SOLN, 187mL OMNIPAQUE IOHEXOL 300 MG/ML SOLN  COMPARISON:  None.  FINDINGS: Lung bases:  Clear.  Heart borderline enlarged.  Gastrointestinal: There changes from gastric surgery. The stomach is decompressed. No stomach inflammatory change. Colon small bowel unremarkable. Normal appendix is visualized.  Liver and spleen:  Unremarkable.  Gallbladder is decompressed but otherwise unremarkable. No bile duct dilation.  Pancreas and adrenal glands:  Normal.  Kidneys, ureters, bladder:  Unremarkable.  Uterus and adnexa: IUD appears malposition. Arms of the IUD projects through the cervix into the upper vagina. Uterus otherwise unremarkable. Normal adnexa.  Lymph nodes: Multiple mildly prominent mesenteric lymph nodes none of which are pathologically enlarged by size criteria.  Ascites:  None.  Abdominal wall:  No hernia.  Musculoskeletal:  Unremarkable.  IMPRESSION: 1. No acute findings. No evidence of a complication following the gastric sleeve procedure. 2. Mal-positioned IUD projecting through the cervix as detailed above. 3. Mildly prominent mesenteric lymph nodes, nonspecific, most likely normal limits showing patient. There are no pathologically enlarged lymph nodes. 4. Exam otherwise unremarkable.   Electronically Signed   By:  Lajean Manes M.D.   On: 04/24/2015 22:00   I have personally reviewed and evaluated these images and lab results as part of my medical decision-making.   EKG Interpretation None      MDM   Final diagnoses:  None   abdominal pain  Patient presents to the ER for evaluation of abdominal pain. Patient reports pain in the midepigastric and left upper quadrant associated with nausea and vomiting. She had gastric sleeve surgery on August 23. She is concerned that she might have disrupted when she was working out 2 days ago. Patient had mild tenderness without guarding or rebound. Lab work was unremarkable. Vital signs are unremarkable. Patient is afebrile. CT scan was performed and there is no evidence of problems with the gastric sleeve procedure. She was informed of the possibility of malpositioned IUD, will need to follow-up with her OB/GYN. She is to follow-up with Dr. Redmond Pulling, her surgeon tomorrow by phone.    Orpah Greek, MD 04/24/15 2228

## 2015-04-24 NOTE — Discharge Instructions (Signed)

## 2015-05-04 ENCOUNTER — Telehealth: Payer: Self-pay | Admitting: Dietician

## 2015-05-04 NOTE — Telephone Encounter (Signed)
Talked to Lorraine Chambers today about suggestions for her nausea and vomiting. She is taking zofran which is not working and has a prescription for a phenergan suppositories which she is not comfortable taking. Recommended that she try ginger tea and clear protein shakes and continue to try having an egg as tolerated. Encouraged her to sip fluids to avoid dehydration. She has an appointment with Dr. Redmond Pulling tomorrow.

## 2015-05-05 ENCOUNTER — Other Ambulatory Visit: Payer: Self-pay | Admitting: General Surgery

## 2015-05-05 DIAGNOSIS — Z9884 Bariatric surgery status: Secondary | ICD-10-CM

## 2015-05-05 DIAGNOSIS — R112 Nausea with vomiting, unspecified: Secondary | ICD-10-CM

## 2015-05-10 ENCOUNTER — Other Ambulatory Visit: Payer: 59

## 2015-05-13 ENCOUNTER — Other Ambulatory Visit (HOSPITAL_COMMUNITY): Payer: Self-pay | Admitting: Family Medicine

## 2015-05-13 ENCOUNTER — Other Ambulatory Visit: Payer: Self-pay

## 2015-05-13 ENCOUNTER — Ambulatory Visit (HOSPITAL_COMMUNITY): Payer: 59 | Attending: Cardiovascular Disease

## 2015-05-13 DIAGNOSIS — E119 Type 2 diabetes mellitus without complications: Secondary | ICD-10-CM | POA: Insufficient documentation

## 2015-05-13 DIAGNOSIS — I10 Essential (primary) hypertension: Secondary | ICD-10-CM | POA: Insufficient documentation

## 2015-05-13 DIAGNOSIS — R011 Cardiac murmur, unspecified: Secondary | ICD-10-CM

## 2015-05-13 DIAGNOSIS — Z8249 Family history of ischemic heart disease and other diseases of the circulatory system: Secondary | ICD-10-CM | POA: Insufficient documentation

## 2015-05-19 ENCOUNTER — Other Ambulatory Visit: Payer: Self-pay | Admitting: General Surgery

## 2015-05-19 ENCOUNTER — Ambulatory Visit
Admission: RE | Admit: 2015-05-19 | Discharge: 2015-05-19 | Disposition: A | Discharge status: Home / Self Care | Payer: 59 | Source: Ambulatory Visit | Attending: General Surgery | Admitting: General Surgery

## 2015-05-19 DIAGNOSIS — R112 Nausea with vomiting, unspecified: Secondary | ICD-10-CM

## 2015-05-19 DIAGNOSIS — Z9884 Bariatric surgery status: Secondary | ICD-10-CM

## 2015-05-20 ENCOUNTER — Inpatient Hospital Stay (HOSPITAL_COMMUNITY)
Admission: EM | Admit: 2015-05-20 | Discharge: 2015-05-29 | DRG: 381 | Disposition: A | Discharge status: Home / Self Care | Payer: 59 | Attending: General Surgery | Admitting: General Surgery

## 2015-05-20 ENCOUNTER — Telehealth: Payer: Self-pay | Admitting: General Surgery

## 2015-05-20 ENCOUNTER — Emergency Department (HOSPITAL_COMMUNITY): Payer: 59

## 2015-05-20 ENCOUNTER — Ambulatory Visit
Admission: RE | Admit: 2015-05-20 | Discharge: 2015-05-20 | Disposition: A | Discharge status: Home / Self Care | Payer: 59 | Source: Ambulatory Visit | Attending: General Surgery | Admitting: General Surgery

## 2015-05-20 ENCOUNTER — Encounter (HOSPITAL_COMMUNITY): Payer: Self-pay | Admitting: *Deleted

## 2015-05-20 DIAGNOSIS — I10 Essential (primary) hypertension: Secondary | ICD-10-CM | POA: Diagnosis present

## 2015-05-20 DIAGNOSIS — R112 Nausea with vomiting, unspecified: Secondary | ICD-10-CM | POA: Diagnosis present

## 2015-05-20 DIAGNOSIS — R51 Headache: Secondary | ICD-10-CM | POA: Diagnosis not present

## 2015-05-20 DIAGNOSIS — Z6841 Body Mass Index (BMI) 40.0 and over, adult: Secondary | ICD-10-CM

## 2015-05-20 DIAGNOSIS — E876 Hypokalemia: Secondary | ICD-10-CM | POA: Diagnosis present

## 2015-05-20 DIAGNOSIS — K219 Gastro-esophageal reflux disease without esophagitis: Secondary | ICD-10-CM | POA: Diagnosis present

## 2015-05-20 DIAGNOSIS — Z91030 Bee allergy status: Secondary | ICD-10-CM

## 2015-05-20 DIAGNOSIS — Z833 Family history of diabetes mellitus: Secondary | ICD-10-CM

## 2015-05-20 DIAGNOSIS — M1990 Unspecified osteoarthritis, unspecified site: Secondary | ICD-10-CM | POA: Diagnosis present

## 2015-05-20 DIAGNOSIS — Z9884 Bariatric surgery status: Secondary | ICD-10-CM

## 2015-05-20 DIAGNOSIS — Z823 Family history of stroke: Secondary | ICD-10-CM

## 2015-05-20 DIAGNOSIS — E119 Type 2 diabetes mellitus without complications: Secondary | ICD-10-CM | POA: Diagnosis present

## 2015-05-20 DIAGNOSIS — R011 Cardiac murmur, unspecified: Secondary | ICD-10-CM | POA: Diagnosis present

## 2015-05-20 DIAGNOSIS — E873 Alkalosis: Secondary | ICD-10-CM | POA: Diagnosis present

## 2015-05-20 DIAGNOSIS — K311 Adult hypertrophic pyloric stenosis: Principal | ICD-10-CM | POA: Diagnosis present

## 2015-05-20 DIAGNOSIS — E039 Hypothyroidism, unspecified: Secondary | ICD-10-CM | POA: Diagnosis present

## 2015-05-20 DIAGNOSIS — Z808 Family history of malignant neoplasm of other organs or systems: Secondary | ICD-10-CM

## 2015-05-20 DIAGNOSIS — D509 Iron deficiency anemia, unspecified: Secondary | ICD-10-CM | POA: Diagnosis present

## 2015-05-20 DIAGNOSIS — Z8249 Family history of ischemic heart disease and other diseases of the circulatory system: Secondary | ICD-10-CM

## 2015-05-20 DIAGNOSIS — E878 Other disorders of electrolyte and fluid balance, not elsewhere classified: Secondary | ICD-10-CM | POA: Diagnosis present

## 2015-05-20 HISTORY — DX: Obesity, unspecified: E66.9

## 2015-05-20 LAB — CBC
HEMATOCRIT: 40.3 % (ref 36.0–46.0)
HEMOGLOBIN: 12.8 g/dL (ref 12.0–15.0)
MCH: 24.7 pg — AB (ref 26.0–34.0)
MCHC: 31.8 g/dL (ref 30.0–36.0)
MCV: 77.6 fL — AB (ref 78.0–100.0)
Platelets: 226 10*3/uL (ref 150–400)
RBC: 5.19 MIL/uL — ABNORMAL HIGH (ref 3.87–5.11)
RDW: 14.7 % (ref 11.5–15.5)
WBC: 9.1 10*3/uL (ref 4.0–10.5)

## 2015-05-20 LAB — URINALYSIS, ROUTINE W REFLEX MICROSCOPIC
GLUCOSE, UA: NEGATIVE mg/dL
HGB URINE DIPSTICK: NEGATIVE
Ketones, ur: NEGATIVE mg/dL
Leukocytes, UA: NEGATIVE
Nitrite: NEGATIVE
PH: 6 (ref 5.0–8.0)
Protein, ur: NEGATIVE mg/dL
SPECIFIC GRAVITY, URINE: 1.009 (ref 1.005–1.030)
Urobilinogen, UA: 4 mg/dL — ABNORMAL HIGH (ref 0.0–1.0)

## 2015-05-20 LAB — COMPREHENSIVE METABOLIC PANEL
ALBUMIN: 4 g/dL (ref 3.5–5.0)
ALK PHOS: 40 U/L (ref 38–126)
ALT: 63 U/L — ABNORMAL HIGH (ref 14–54)
ANION GAP: 10 (ref 5–15)
AST: 54 U/L — AB (ref 15–41)
CO2: 34 mmol/L — AB (ref 22–32)
Calcium: 8.9 mg/dL (ref 8.9–10.3)
Chloride: 95 mmol/L — ABNORMAL LOW (ref 101–111)
Creatinine, Ser: 0.5 mg/dL (ref 0.44–1.00)
GFR calc Af Amer: >60 mL/min (ref >60)
GFR calc non Af Amer: >60 mL/min (ref >60)
GLUCOSE: 120 mg/dL — AB (ref 65–99)
POTASSIUM: 2.9 mmol/L — AB (ref 3.5–5.1)
SODIUM: 139 mmol/L (ref 135–145)
Total Bilirubin: 2.6 mg/dL — ABNORMAL HIGH (ref 0.3–1.2)
Total Protein: 7.9 g/dL (ref 6.5–8.1)

## 2015-05-20 LAB — LIPASE, BLOOD: Lipase: 28 U/L (ref 22–51)

## 2015-05-20 LAB — I-STAT BETA HCG BLOOD, ED (MC, WL, AP ONLY)

## 2015-05-20 NOTE — ED Notes (Signed)
Pt is 8 weeks post op from gastric sleeve surgery. Pt complains of nausea, vomiting for the past 3 weeks that became worse today. Pt had an upper GI scan performed which showed narrowing in her stomach. Pt was told to come to [facility] if symptoms worsen. Pt states she only has abdominal pain after vomiting. Pt states she has had off and on vomiting since the upper GI scan this morning.

## 2015-05-20 NOTE — ED Provider Notes (Signed)
CSN: [id number]     Arrival date & time 05/20/15  2149 History  By signing my name below, I, Irene Pap, attest that this documentation has been prepared under the direction and in the presence of Tanna Furry, MD. Electronically Signed: Irene Pap, ED Scribe. 05/20/2015. 12:18 AM.  No chief complaint on file.  The history is provided by the patient. No language interpreter was used.    HPI Comments: Lorraine Chambers is a 29 y.o. Female with hx of HTN, thyroid disease, Type 2 DM, and pneumonia who presents to the Emergency Department complaining of recurrent, gradually worsening, constant nausea and vomiting onset 3 weeks ago. Pt states that she is 8 weeks post op from a gastric sleeve surgery. She states that her symptoms started after throwing up at the gym. Pt states that she saw her surgeon who told her to modify her diet, but has continued to have nausea and vomiting. Pt states that she had an upper GI scan earlier today that showed narrowing in her stomach. Pt was told to switch to liquids by her doctor and continues to vomit. Pt states that she was directed to the ED if symptoms worsen. She last drank water around 7:15 PM tonight. She reports associated abdominal pain after vomiting, generalized weakness, and lethargy. She denies diarrhea and fever. She denies allergies to medication.   Past Medical History  Diagnosis Date  . Arthritis   . Hypertension     Pt reports episode of HTN in 2009  . Thyroid disease   . Hydradenitis 02/09/2014  . Iron deficiency anemia, unspecified 02/09/2014  . Diabetes mellitus without complication ([place])   . Hypothyroidism 08/23/2014  . DM (diabetes mellitus), type 2 ([place]) 08/23/2014  . Heart murmur   . Pneumonia   . GERD (gastroesophageal reflux disease)   . Headache    Past Surgical History  Procedure Laterality Date  . Wisdom tooth extraction    . Laparoscopic gastric sleeve resection N/A 03/29/2015    Procedure: LAPAROSCOPIC GASTRIC SLEEVE  RESECTION WITH UPPER ENDO;  Surgeon: Greer Pickerel, MD;  Location: [facility];  Service: General;  Laterality: N/A;  . Upper gi endoscopy  03/29/2015    Procedure: UPPER GI ENDOSCOPY;  Surgeon: Greer Pickerel, MD;  Location: [facility];  Service: General;;   Family History  Problem Relation Age of Onset  . Stroke Other   . Heart failure Other   . Heart disease Mother   . Diabetes Mother   . Hypertension Mother   . Cancer Maternal Uncle     brain ca  . Diabetes Father   . Arthritis Father   . Other Father   . Pulmonary fibrosis Mother   . Pulmonary fibrosis Maternal Grandmother    Social History  Substance Use Topics  . Smoking status: Never Smoker   . Smokeless tobacco: Never Used  . Alcohol Use: Yes     Comment: occassional   OB History    No data available     Review of Systems  Constitutional: Positive for fatigue. Negative for fever, chills, diaphoresis and appetite change.  HENT: Negative for mouth sores, sore throat and trouble swallowing.   Eyes: Negative for visual disturbance.  Respiratory: Negative for cough, chest tightness, shortness of breath and wheezing.   Cardiovascular: Negative for chest pain.  Gastrointestinal: Positive for nausea, vomiting and abdominal pain. Negative for diarrhea and abdominal distention.  Endocrine: Negative for polydipsia, polyphagia and polyuria.  Genitourinary: Negative for dysuria, frequency and hematuria.  Musculoskeletal:  Negative for gait problem.  Skin: Negative for color change, pallor and rash.  Neurological: Negative for dizziness, syncope, light-headedness and headaches.  Hematological: Does not bruise/bleed easily.  Psychiatric/Behavioral: Negative for behavioral problems and confusion.   Allergies  Bee venom  Home Medications   Prior to Admission medications   Medication Sig Start Date End Date Taking? Authorizing Provider  promethazine (PHENERGAN) 6.25 MG/5ML syrup Take 5 mLs by mouth every 6 (six) hours as needed. For nausea  05/05/15  Yes Historical Provider, MD  oxyCODONE (ROXICODONE) 5 MG/5ML solution Take 5-10 mLs (5-10 mg total) by mouth every 4 (four) hours as needed for moderate pain or severe pain. Patient not taking: Reported on 05/20/2015 04/01/15   Greer Pickerel, MD  pantoprazole (PROTONIX) 40 MG tablet Take 1 tablet (40 mg total) by mouth daily. Patient not taking: Reported on 05/20/2015 04/01/15   Greer Pickerel, MD  promethazine (PHENERGAN) 25 MG suppository Place 1 suppository (25 mg total) rectally every 6 (six) hours as needed for nausea or vomiting. Patient not taking: Reported on 05/20/2015 04/24/15   Orpah Greek, MD   BP 125/79 mmHg  Pulse 85  Temp(Src) 98.3 F (36.8 C) (Oral)  Resp 18  SpO2 98%  LMP 04/08/2015 Physical Exam  Constitutional: She is oriented to person, place, and time. She appears well-developed and well-nourished. No distress.  HENT:  Head: Normocephalic.  Eyes: Conjunctivae are normal. Pupils are equal, round, and reactive to light. No scleral icterus.  Neck: Normal range of motion. Neck supple. No thyromegaly present.  Cardiovascular: Normal rate and regular rhythm.  Exam reveals no gallop and no friction rub.   No murmur heard. Pulmonary/Chest: Effort normal and breath sounds normal. No respiratory distress. She has no wheezes. She has no rales.  Abdominal: Soft. She exhibits no distension. There is no tenderness. There is no rebound.  Slightly hypoactive bowel sounds  Musculoskeletal: Normal range of motion.  Neurological: She is alert and oriented to person, place, and time.  Skin: Skin is warm and dry. No rash noted.  Psychiatric: She has a normal mood and affect. Her behavior is normal.    ED Course  Procedures (including critical care time) DIAGNOSTIC STUDIES: Oxygen Saturation is 98% on RA, normal by my interpretation.    COORDINATION OF CARE: 11:18 PM-Discussed treatment plan which includes IV fluids, labs and x-ray with pt at bedside and pt agreed to  plan.   Labs Review Labs Reviewed  COMPREHENSIVE METABOLIC PANEL - Abnormal; Notable for the following:    Potassium 2.9 (*)    Chloride 95 (*)    CO2 34 (*)    Glucose, Bld 120 (*)    BUN <5 (*)    AST 54 (*)    ALT 63 (*)    Total Bilirubin 2.6 (*)    All other components within normal limits  CBC - Abnormal; Notable for the following:    RBC 5.19 (*)    MCV 77.6 (*)    MCH 24.7 (*)    All other components within normal limits  URINALYSIS, ROUTINE W REFLEX MICROSCOPIC (NOT AT [facility]) - Abnormal; Notable for the following:    Color, Urine AMBER (*)    APPearance CLOUDY (*)    Bilirubin Urine SMALL (*)    Urobilinogen, UA 4.0 (*)    All other components within normal limits  LIPASE, BLOOD  BASIC METABOLIC PANEL  I-STAT BETA HCG BLOOD, ED ([facility], [facility], [facility] ONLY)    Imaging Review Dg Abd Acute  W/chest  05/21/2015  CLINICAL DATA:  Patient is 8 weeks postoperative from gastric sleeve surgery. Now complains of nausea and vomiting for 3 weeks, worsening today. Upper GI examination demonstrated gastric narrowing. EXAM: DG ABDOMEN ACUTE W/ 1V CHEST COMPARISON:  Upper GI 05/20/2015. CT abdomen and pelvis 04/24/2015. Upper GI 03/30/2015. Chest 03/22/2015. FINDINGS: Normal heart size and pulmonary vascularity. No focal airspace disease or consolidation in the lungs. No blunting of costophrenic angles. No pneumothorax. Mediastinal contours appear intact. Scattered gas and stool in the colon. No small or large bowel distention. No free intra-abdominal air. No abnormal air-fluid levels. No radiopaque stones. Visualized bones appear intact. Residual contrast material in the decompressed colon. Surgical clips in the left upper quadrant. IMPRESSION: No evidence of active pulmonary disease. Nonobstructive bowel gas pattern. Postoperative changes in the left upper quadrant. Electronically Signed   By: Lucienne Capers M.D.   On: 05/21/2015 00:09   Dg Ugi  W/kub  05/20/2015  CLINICAL DATA:  Nausea and  vomiting, vomiting of unspecified type. Symptoms for 4 weeks. Status post laparoscopic sleeve gastrectomy August 2016. EXAM: UPPER GI SERIES WITH KUB TECHNIQUE: After obtaining a scout radiograph a routine upper GI series was performed using thin barium. FLUOROSCOPY TIME:  Radiation Exposure Index (as provided by the fluoroscopic device): 261 dGy cm2 If the device does not provide the exposure index: Fluoroscopy Time (in minutes and seconds):  1 minutes 36 seconds. Number of Acquired Images:  11 COMPARISON:  CT abdomen pelvis 04/24/2015 and postoperative upper GI a 03/30/2015. FINDINGS: Scout view of the abdomen shows a normal bowel gas pattern. Patient is status post laparoscopic sleeve gastrectomy. Patient was able to drink a minimal amount of thin barium due to nausea and vomiting. However, there is dilatation of the proximal third of the stomach with a persistent area of narrowing at the junction of the proximal and middle thirds of the stomach. Contrast does pass through into the distal stomach. IMPRESSION: Dilated proximal stomach with a focal area of narrowing at the junction of the proximal and middle thirds of the stomach, status post gastric sleeve procedure. Electronically Signed   By: Lorin Picket M.D.   On: 05/20/2015 08:51   I have personally reviewed and evaluated these images and lab results as part of my medical decision-making.   EKG Interpretation None      MDM   Final diagnoses:  Gastric outlet obstruction    Patient's contrast study this morning essentially shows a string sign consistent with near complete gastric outlet obstruction. Remains symptomatic. Plan will be admission for fluid hydration, potassium replacement. Discussed with Dr. Barkley Bruns of central purulent surgery. Plan at morning endoscopy. Admission.      Tanna Furry, MD 05/21/15 [phone]

## 2015-05-20 NOTE — Telephone Encounter (Signed)
I called patient to discuss the results of her upper GI done earlier today.  Status post sleeve gastrectomy mid-August.she had been doing well after surgery and tolerating liquids and solids but after working out 1 evening she developed intolerance to solids and came to the office for evaluation.  I ordered an upper GI which was done today. It showed an area of narrowing in her mid stomach with dilation in the upper stomach.  Barium did pass through into the distal stomach.  This does look different than her immediate postoperative upper GI.  She is only tolerating liquids.  She is not tolerating solids.  She denies any fever, chills, abdominal pain.  She is tolerating most liquids most the time.  She will occasionally have some regurgitation.  She is not taking her Protonix.  She did not get her Carafate prescription filled.  The Phenergan helps.  I explained I was not sure what is causing the narrowing.  My hope is that it some inflammation.  I have advised her to start taking liquid reflux medication twice a day and getting the Carafate prescription filled.  I will send these N electronically to [place].  I will also give her a refill on liquid Phenergan.  I advised her that if her liquid intake decreases further or she develops additional symptoms she is to go to the [place] emergency room.  She may need to be admitted for fluids and upper endoscopy.  I will have my nurse call her and check on her own Monday.  I will send a message to Dr. Lucia Gaskins who is the hospital surgeon next week since I'm out of the office.  I will also alert our on call team

## 2015-05-21 ENCOUNTER — Encounter (HOSPITAL_COMMUNITY): Payer: Self-pay | Admitting: *Deleted

## 2015-05-21 DIAGNOSIS — E878 Other disorders of electrolyte and fluid balance, not elsewhere classified: Secondary | ICD-10-CM | POA: Diagnosis present

## 2015-05-21 DIAGNOSIS — Z833 Family history of diabetes mellitus: Secondary | ICD-10-CM | POA: Diagnosis not present

## 2015-05-21 DIAGNOSIS — Z823 Family history of stroke: Secondary | ICD-10-CM | POA: Diagnosis not present

## 2015-05-21 DIAGNOSIS — I10 Essential (primary) hypertension: Secondary | ICD-10-CM | POA: Diagnosis present

## 2015-05-21 DIAGNOSIS — K311 Adult hypertrophic pyloric stenosis: Secondary | ICD-10-CM | POA: Diagnosis present

## 2015-05-21 DIAGNOSIS — R51 Headache: Secondary | ICD-10-CM | POA: Diagnosis not present

## 2015-05-21 DIAGNOSIS — Z8249 Family history of ischemic heart disease and other diseases of the circulatory system: Secondary | ICD-10-CM | POA: Diagnosis not present

## 2015-05-21 DIAGNOSIS — Z6841 Body Mass Index (BMI) 40.0 and over, adult: Secondary | ICD-10-CM | POA: Diagnosis not present

## 2015-05-21 DIAGNOSIS — E039 Hypothyroidism, unspecified: Secondary | ICD-10-CM | POA: Diagnosis present

## 2015-05-21 DIAGNOSIS — D509 Iron deficiency anemia, unspecified: Secondary | ICD-10-CM | POA: Diagnosis present

## 2015-05-21 DIAGNOSIS — E873 Alkalosis: Secondary | ICD-10-CM | POA: Diagnosis present

## 2015-05-21 DIAGNOSIS — Z91030 Bee allergy status: Secondary | ICD-10-CM | POA: Diagnosis not present

## 2015-05-21 DIAGNOSIS — R112 Nausea with vomiting, unspecified: Secondary | ICD-10-CM | POA: Diagnosis present

## 2015-05-21 DIAGNOSIS — M1990 Unspecified osteoarthritis, unspecified site: Secondary | ICD-10-CM | POA: Diagnosis present

## 2015-05-21 DIAGNOSIS — Z808 Family history of malignant neoplasm of other organs or systems: Secondary | ICD-10-CM | POA: Diagnosis not present

## 2015-05-21 DIAGNOSIS — Z9884 Bariatric surgery status: Secondary | ICD-10-CM | POA: Diagnosis not present

## 2015-05-21 DIAGNOSIS — K219 Gastro-esophageal reflux disease without esophagitis: Secondary | ICD-10-CM | POA: Diagnosis present

## 2015-05-21 DIAGNOSIS — R011 Cardiac murmur, unspecified: Secondary | ICD-10-CM | POA: Diagnosis present

## 2015-05-21 DIAGNOSIS — E876 Hypokalemia: Secondary | ICD-10-CM | POA: Diagnosis present

## 2015-05-21 DIAGNOSIS — E119 Type 2 diabetes mellitus without complications: Secondary | ICD-10-CM | POA: Diagnosis present

## 2015-05-21 LAB — BASIC METABOLIC PANEL
Anion gap: 8 (ref 5–15)
BUN: <5 mg/dL — ABNORMAL LOW (ref 6–20)
CALCIUM: 7.9 mg/dL — AB (ref 8.9–10.3)
CO2: 30 mmol/L (ref 22–32)
CREATININE: 0.54 mg/dL (ref 0.44–1.00)
Chloride: 99 mmol/L — ABNORMAL LOW (ref 101–111)
GFR calc Af Amer: >60 mL/min (ref >60)
GFR calc non Af Amer: >60 mL/min (ref >60)
GLUCOSE: 119 mg/dL — AB (ref 65–99)
Potassium: 2.8 mmol/L — ABNORMAL LOW (ref 3.5–5.1)
Sodium: 137 mmol/L (ref 135–145)

## 2015-05-21 MED ORDER — ONDANSETRON HCL 4 MG/2ML IJ SOLN
4.0000 mg | Freq: Once | INTRAMUSCULAR | Status: AC
  Administered 2015-05-21: 4 mg via INTRAVENOUS
  Filled 2015-05-21: qty 2

## 2015-05-21 MED ORDER — SODIUM CHLORIDE 0.9 % IV BOLUS (SEPSIS)
1000.0000 mL | Freq: Once | INTRAVENOUS | Status: AC
  Administered 2015-05-21: 1000 mL via INTRAVENOUS

## 2015-05-21 MED ORDER — ONDANSETRON 4 MG PO TBDP
4.0000 mg | ORAL_TABLET | Freq: Four times a day (QID) | ORAL | Status: DC | PRN
  Filled 2015-05-21 (×4): qty 1

## 2015-05-21 MED ORDER — KCL IN DEXTROSE-NACL 40-5-0.9 MEQ/L-%-% IV SOLN
INTRAVENOUS | Status: DC
  Administered 2015-05-21: 04:00:00 via INTRAVENOUS
  Administered 2015-05-21: 1000 mL via INTRAVENOUS
  Administered 2015-05-22: 23:00:00 via INTRAVENOUS
  Administered 2015-05-22 – 2015-05-23 (×3): 1000 mL via INTRAVENOUS
  Administered 2015-05-24: 03:00:00 via INTRAVENOUS
  Filled 2015-05-21 (×11): qty 1000

## 2015-05-21 MED ORDER — POTASSIUM CHLORIDE 10 MEQ/100ML IV SOLN
10.0000 meq | INTRAVENOUS | Status: AC
  Administered 2015-05-21 (×4): 10 meq via INTRAVENOUS
  Filled 2015-05-21 (×4): qty 100

## 2015-05-21 MED ORDER — ENOXAPARIN SODIUM 40 MG/0.4ML ~~LOC~~ SOLN
40.0000 mg | SUBCUTANEOUS | Status: DC
  Administered 2015-05-21: 40 mg via SUBCUTANEOUS
  Filled 2015-05-21 (×7): qty 0.4

## 2015-05-21 MED ORDER — ONDANSETRON HCL 4 MG/2ML IJ SOLN
4.0000 mg | Freq: Four times a day (QID) | INTRAMUSCULAR | Status: DC | PRN
  Administered 2015-05-21 – 2015-05-22 (×4): 4 mg via INTRAVENOUS
  Filled 2015-05-21 (×4): qty 2

## 2015-05-21 MED ORDER — CHLORHEXIDINE GLUCONATE 0.12 % MT SOLN
15.0000 mL | Freq: Two times a day (BID) | OROMUCOSAL | Status: DC
  Administered 2015-05-21 – 2015-05-28 (×11): 15 mL via OROMUCOSAL
  Filled 2015-05-21 (×13): qty 15

## 2015-05-21 MED ORDER — POTASSIUM CHLORIDE 10 MEQ/100ML IV SOLN
10.0000 meq | Freq: Once | INTRAVENOUS | Status: AC
  Administered 2015-05-21: 10 meq via INTRAVENOUS
  Filled 2015-05-21: qty 100

## 2015-05-21 MED ORDER — PANTOPRAZOLE SODIUM 40 MG IV SOLR
40.0000 mg | Freq: Two times a day (BID) | INTRAVENOUS | Status: DC
  Administered 2015-05-21 – 2015-05-24 (×9): 40 mg via INTRAVENOUS
  Filled 2015-05-21 (×10): qty 40

## 2015-05-21 MED ORDER — CETYLPYRIDINIUM CHLORIDE 0.05 % MT LIQD
7.0000 mL | Freq: Two times a day (BID) | OROMUCOSAL | Status: DC
  Administered 2015-05-21 – 2015-05-28 (×14): 7 mL via OROMUCOSAL

## 2015-05-21 NOTE — Progress Notes (Signed)
Patient with potassium level 2.8.  Dr. Zella Richer sent text message and he placed order for 4 IV runs of potassium.  Lorraine Chambers [facility]  05/21/2015

## 2015-05-21 NOTE — Progress Notes (Signed)
29 y.o female s/p gastric sleeve by Dr. Redmond Pulling 03/29/15 who has developed gastric outlet obstruction and cannot keep any oral intake in.  UGI demonstrates a significant narrowling at the junction of the proximal and middle third of the stomach.  She presented to the ED.  Dr. Redmond Pulling recommended she be admitted for IVF hydration and the EGD.  She does have a metabolic alkalosis from n/v.  Admission orders written.

## 2015-05-21 NOTE — H&P (Signed)
Lorraine Chambers is an 29 y.o. female.   Chief Complaint:   Persistent nausea and vomiting. HPI:  This is a 29 year old female who underwent laparoscopic gastric sleeve for morbid obesity 03/29/2015 by Dr. Redmond Pulling. She did well for 3 weeks. At the 3 week mark, she was doing  Abdominal exercises at the gym. Following this, she began having difficulty with nausea and vomiting. This was initially to solids but not liquids. However, it has now progressed to liquids. She has been persistently vomiting without being able to keep any oral intake down. She had a upper GI yesterday which demonstrated a significant narrowing at the junction of the proximal and middle stomach like a string sign. She subsequently came in last night because of persistent nausea and vomiting is noted have a hypokalemic metabolic alkalosis. She was admitted.  Past Medical History  Diagnosis Date  . Hydradenitis 02/09/2014  . Iron deficiency anemia, unspecified 02/09/2014  . Pneumonia   . GERD (gastroesophageal reflux disease)   . Headache   . Arthritis     left knee  . Hypertension     Pt reports episode of HTN in 2009--no longer an issue 05/2015  . Thyroid disease   . Hypothyroidism 08/23/2014    resolved since gastric sleeve surgery   . Obesity   . Heart murmur     ECHO last friday10/7/16 see cardiology next month Nov 2016  . Diabetes mellitus without complication ([place])   . DM (diabetes mellitus), type 2 ([place]) 08/23/2014    last A1C 5.5 % per patient. 05/2015    Past Surgical History  Procedure Laterality Date  . Wisdom tooth extraction    . Laparoscopic gastric sleeve resection N/A 03/29/2015    Procedure: LAPAROSCOPIC GASTRIC SLEEVE RESECTION WITH UPPER ENDO;  Surgeon: Greer Pickerel, MD;  Location: [facility];  Service: General;  Laterality: N/A;  . Upper gi endoscopy  03/29/2015    Procedure: UPPER GI ENDOSCOPY;  Surgeon: Greer Pickerel, MD;  Location: [facility];  Service: General;;    Family History  Problem Relation Age  of Onset  . Stroke Other   . Heart failure Other   . Heart disease Mother   . Diabetes Mother   . Hypertension Mother   . Pulmonary fibrosis Mother   . Cancer Maternal Uncle     brain ca  . Diabetes Father   . Arthritis Father   . Other Father   . Pulmonary fibrosis Maternal Grandmother   . Heart murmur    . Heart murmur Sister    Social History:  reports that she has never smoked. She has never used smokeless tobacco. She reports that she drinks alcohol. She reports that she does not use illicit drugs.  Allergies:  Allergies  Allergen Reactions  . Bee Venom Anaphylaxis    Medications Prior to Admission  Medication Sig Dispense Refill  . promethazine (PHENERGAN) 6.25 MG/5ML syrup Take 5 mLs by mouth every 6 (six) hours as needed. For nausea  0  . oxyCODONE (ROXICODONE) 5 MG/5ML solution Take 5-10 mLs (5-10 mg total) by mouth every 4 (four) hours as needed for moderate pain or severe pain. (Patient not taking: Reported on 05/20/2015)  0  . pantoprazole (PROTONIX) 40 MG tablet Take 1 tablet (40 mg total) by mouth daily. (Patient not taking: Reported on 05/20/2015) 30 tablet 0  . promethazine (PHENERGAN) 25 MG suppository Place 1 suppository (25 mg total) rectally every 6 (six) hours as needed for nausea or vomiting. (Patient not taking:  Reported on 05/20/2015) 12 suppository 2    Results for orders placed or performed during the hospital encounter of 05/20/15 (from the past 48 hour(s))  Lipase, blood     Status: None   Collection Time: 05/20/15 10:50 PM  Result Value Ref Range   Lipase 28 22 - 51 U/L  Comprehensive metabolic panel     Status: Abnormal   Collection Time: 05/20/15 10:50 PM  Result Value Ref Range   Sodium 139 135 - 145 mmol/L   Potassium 2.9 (L) 3.5 - 5.1 mmol/L   Chloride 95 (L) 101 - 111 mmol/L   CO2 34 (H) 22 - 32 mmol/L   Glucose, Bld 120 (H) 65 - 99 mg/dL   BUN <5 (L) 6 - 20 mg/dL   Creatinine, Ser 0.50 0.44 - 1.00 mg/dL   Calcium 8.9 8.9 - 10.3 mg/dL    Total Protein 7.9 6.5 - 8.1 g/dL   Albumin 4.0 3.5 - 5.0 g/dL   AST 54 (H) 15 - 41 U/L   ALT 63 (H) 14 - 54 U/L   Alkaline Phosphatase 40 38 - 126 U/L   Total Bilirubin 2.6 (H) 0.3 - 1.2 mg/dL   GFR calc non Af Amer >60 >60 mL/min   GFR calc Af Amer >60 >60 mL/min    Comment: (NOTE) The eGFR has been calculated using the CKD EPI equation. This calculation has not been validated in all clinical situations. eGFR's persistently <60 mL/min signify possible Chronic Kidney Disease.    Anion gap 10 5 - 15  CBC     Status: Abnormal   Collection Time: 05/20/15 10:50 PM  Result Value Ref Range   WBC 9.1 4.0 - 10.5 K/uL   RBC 5.19 (H) 3.87 - 5.11 MIL/uL   Hemoglobin 12.8 12.0 - 15.0 g/dL   HCT 40.3 36.0 - 46.0 %   MCV 77.6 (L) 78.0 - 100.0 fL   MCH 24.7 (L) 26.0 - 34.0 pg   MCHC 31.8 30.0 - 36.0 g/dL   RDW 14.7 11.5 - 15.5 %   Platelets 226 150 - 400 K/uL    Comment: REPEATED TO VERIFY  Urinalysis, Routine w reflex microscopic (not at [facility])     Status: Abnormal   Collection Time: 05/20/15 10:59 PM  Result Value Ref Range   Color, Urine AMBER (A) YELLOW    Comment: BIOCHEMICALS MAY BE AFFECTED BY COLOR   APPearance CLOUDY (A) CLEAR   Specific Gravity, Urine 1.009 1.005 - 1.030   pH 6.0 5.0 - 8.0   Glucose, UA NEGATIVE NEGATIVE mg/dL   Hgb urine dipstick NEGATIVE NEGATIVE   Bilirubin Urine SMALL (A) NEGATIVE   Ketones, ur NEGATIVE NEGATIVE mg/dL   Protein, ur NEGATIVE NEGATIVE mg/dL   Urobilinogen, UA 4.0 (H) 0.0 - 1.0 mg/dL   Nitrite NEGATIVE NEGATIVE   Leukocytes, UA NEGATIVE NEGATIVE    Comment: MICROSCOPIC NOT DONE ON URINES WITH NEGATIVE PROTEIN, BLOOD, LEUKOCYTES, NITRITE, OR GLUCOSE <1000 mg/dL.  I-Stat beta hCG blood, ED ([facility], [facility], [facility] only)     Status: None   Collection Time: 05/20/15 11:01 PM  Result Value Ref Range   I-stat hCG, quantitative <5.0 <5 mIU/mL   Comment 3            Comment:   GEST. AGE      CONC.  (mIU/mL)   <=1 WEEK        5 - 50     2 WEEKS       50 -  500     3 WEEKS       100 - 10,000     4 WEEKS     1,000 - 30,000        FEMALE AND NON-PREGNANT FEMALE:     LESS THAN 5 mIU/mL    Dg Abd Acute W/chest  05/21/2015  CLINICAL DATA:  Patient is 8 weeks postoperative from gastric sleeve surgery. Now complains of nausea and vomiting for 3 weeks, worsening today. Upper GI examination demonstrated gastric narrowing. EXAM: DG ABDOMEN ACUTE W/ 1V CHEST COMPARISON:  Upper GI 05/20/2015. CT abdomen and pelvis 04/24/2015. Upper GI 03/30/2015. Chest 03/22/2015. FINDINGS: Normal heart size and pulmonary vascularity. No focal airspace disease or consolidation in the lungs. No blunting of costophrenic angles. No pneumothorax. Mediastinal contours appear intact. Scattered gas and stool in the colon. No small or large bowel distention. No free intra-abdominal air. No abnormal air-fluid levels. No radiopaque stones. Visualized bones appear intact. Residual contrast material in the decompressed colon. Surgical clips in the left upper quadrant. IMPRESSION: No evidence of active pulmonary disease. Nonobstructive bowel gas pattern. Postoperative changes in the left upper quadrant. Electronically Signed   By: Lucienne Capers M.D.   On: 05/21/2015 00:09   Dg Ugi  W/kub  05/20/2015  CLINICAL DATA:  Nausea and vomiting, vomiting of unspecified type. Symptoms for 4 weeks. Status post laparoscopic sleeve gastrectomy August 2016. EXAM: UPPER GI SERIES WITH KUB TECHNIQUE: After obtaining a scout radiograph a routine upper GI series was performed using thin barium. FLUOROSCOPY TIME:  Radiation Exposure Index (as provided by the fluoroscopic device): 261 dGy cm2 If the device does not provide the exposure index: Fluoroscopy Time (in minutes and seconds):  1 minutes 36 seconds. Number of Acquired Images:  11 COMPARISON:  CT abdomen pelvis 04/24/2015 and postoperative upper GI a 03/30/2015. FINDINGS: Scout view of the abdomen shows a normal bowel gas pattern. Patient is status post  laparoscopic sleeve gastrectomy. Patient was able to drink a minimal amount of thin barium due to nausea and vomiting. However, there is dilatation of the proximal third of the stomach with a persistent area of narrowing at the junction of the proximal and middle thirds of the stomach. Contrast does pass through into the distal stomach. IMPRESSION: Dilated proximal stomach with a focal area of narrowing at the junction of the proximal and middle thirds of the stomach, status post gastric sleeve procedure. Electronically Signed   By: Lorin Picket M.D.   On: 05/20/2015 08:51    Review of Systems  Constitutional: Positive for weight loss.  Gastrointestinal: Positive for constipation. Negative for abdominal pain and diarrhea.    Blood pressure 127/67, pulse 71, temperature 98.1 F (36.7 C), temperature source Oral, resp. rate 18, height 5' (1.524 m), weight 133.448 kg (294 lb 3.2 oz), last menstrual period 04/08/2015, SpO2 100 %. Physical Exam  Constitutional:  Morbidly obese female in no acute distress.  HENT:  Head: Normocephalic and atraumatic.  Cardiovascular: Normal rate and regular rhythm.   Respiratory: Effort normal and breath sounds normal.  GI: Soft. There is no tenderness.  Multiple small incisions clean and intact.  Neurological: She is alert.  Psychiatric: She has a normal mood and affect. Her behavior is normal.     Assessment/Plan 1. Persistent nausea and vomiting following gastric sleeve procedure with near complete gastric outlet obstruction.Could be secondary to edema from the operation.  2.  Hypochloremic, hypokalemic metabolic alkalosis secondary to nausea and vomiting.  Plan: IV fluid hydration  and electrolyte replacement. We'll try a trial of clear liquids tomorrow once electrolytes are corrected. If symptoms persist, may need upper endoscopy. This has all been discussed with her.   Ade Stmarie J 05/21/2015, 8:34 AM

## 2015-05-22 LAB — MAGNESIUM: Magnesium: 1.3 mg/dL — ABNORMAL LOW (ref 1.7–2.4)

## 2015-05-22 LAB — BASIC METABOLIC PANEL
Anion gap: 6 (ref 5–15)
CHLORIDE: 107 mmol/L (ref 101–111)
CO2: 28 mmol/L (ref 22–32)
Calcium: 8.1 mg/dL — ABNORMAL LOW (ref 8.9–10.3)
Creatinine, Ser: 0.44 mg/dL (ref 0.44–1.00)
GFR calc Af Amer: >60 mL/min (ref >60)
GFR calc non Af Amer: >60 mL/min (ref >60)
GLUCOSE: 124 mg/dL — AB (ref 65–99)
POTASSIUM: 3.6 mmol/L (ref 3.5–5.1)
Sodium: 141 mmol/L (ref 135–145)

## 2015-05-22 MED ORDER — PROMETHAZINE HCL 25 MG/ML IJ SOLN
12.5000 mg | INTRAMUSCULAR | Status: DC | PRN
Start: 2015-05-22 — End: 2015-05-25
  Administered 2015-05-22 – 2015-05-24 (×5): 12.5 mg via INTRAVENOUS
  Filled 2015-05-22 (×6): qty 1

## 2015-05-22 MED ORDER — ACETAMINOPHEN 325 MG PO TABS
650.0000 mg | ORAL_TABLET | ORAL | Status: DC | PRN
  Administered 2015-05-22 – 2015-05-26 (×2): 650 mg via ORAL
  Filled 2015-05-22: qty 2

## 2015-05-22 MED ORDER — MAGNESIUM SULFATE 2 GM/50ML IV SOLN
2.0000 g | Freq: Once | INTRAVENOUS | Status: AC
  Administered 2015-05-22: 2 g via INTRAVENOUS
  Filled 2015-05-22: qty 50

## 2015-05-22 NOTE — Plan of Care (Signed)
Problem: Phase II Progression Outcomes Goal: Progress activity as tolerated unless otherwise ordered Outcome: Adequate for Discharge Pt ambulating around unit twice an hour.

## 2015-05-22 NOTE — Progress Notes (Signed)
Subjective: Walking frequently.  Was able to keep Tylenol down (for headache) with some water.  Objective: Vital signs in last 24 hours: Temp:  [97.4 F (36.3 C)-98.3 F (36.8 C)] 97.7 F (36.5 C) (10/16 0448) Pulse Rate:  [58-80] 69 (10/16 0448) Resp:  [18-20] 18 (10/16 0448) BP: (104-124)/(51-82) 114/67 mmHg (10/16 0448) SpO2:  [97 %-100 %] 98 % (10/16 0448) Last BM Date: 05/17/15  Intake/Output from previous day: 10/15 0701 - 10/16 0700 In: 2879.7 [P.O.:240; I.V.:2239.7; IV Piggyback:400] Out: 600 [Urine:600] Intake/Output this shift:    PE: General- In NAD Abdomen-soft, not tender  Lab Results:   Recent Labs  05/20/15 2250  WBC 9.1  HGB 12.8  HCT 40.3  PLT 226   BMET  Recent Labs  05/20/15 2250 05/21/15 0819  NA 139 137  K 2.9* 2.8*  CL 95* 99*  CO2 34* 30  GLUCOSE 120* 119*  BUN <5* <5*  CREATININE 0.50 0.54  CALCIUM 8.9 7.9*   PT/INR No results for input(s): LABPROT, INR in the last 72 hours. Comprehensive Metabolic Panel:    Component Value Date/Time   NA 137 05/21/2015 0819   NA 139 05/20/2015 2250   K 2.8* 05/21/2015 0819   K 2.9* 05/20/2015 2250   CL 99* 05/21/2015 0819   CL 95* 05/20/2015 2250   CO2 30 05/21/2015 0819   CO2 34* 05/20/2015 2250   BUN <5* 05/21/2015 0819   BUN <5* 05/20/2015 2250   CREATININE 0.54 05/21/2015 0819   CREATININE 0.50 05/20/2015 2250   GLUCOSE 119* 05/21/2015 0819   GLUCOSE 120* 05/20/2015 2250   CALCIUM 7.9* 05/21/2015 0819   CALCIUM 8.9 05/20/2015 2250   AST 54* 05/20/2015 2250   AST 69* 04/24/2015 1754   ALT 63* 05/20/2015 2250   ALT 121* 04/24/2015 1754   ALKPHOS 40 05/20/2015 2250   ALKPHOS 51 04/24/2015 1754   BILITOT 2.6* 05/20/2015 2250   BILITOT 2.3* 04/24/2015 1754   PROT 7.9 05/20/2015 2250   PROT 7.8 04/24/2015 1754   ALBUMIN 4.0 05/20/2015 2250   ALBUMIN 3.8 04/24/2015 1754     Studies/Results: Dg Abd Acute W/chest  05/21/2015  CLINICAL DATA:  Patient is 8 weeks  postoperative from gastric sleeve surgery. Now complains of nausea and vomiting for 3 weeks, worsening today. Upper GI examination demonstrated gastric narrowing. EXAM: DG ABDOMEN ACUTE W/ 1V CHEST COMPARISON:  Upper GI 05/20/2015. CT abdomen and pelvis 04/24/2015. Upper GI 03/30/2015. Chest 03/22/2015. FINDINGS: Normal heart size and pulmonary vascularity. No focal airspace disease or consolidation in the lungs. No blunting of costophrenic angles. No pneumothorax. Mediastinal contours appear intact. Scattered gas and stool in the colon. No small or large bowel distention. No free intra-abdominal air. No abnormal air-fluid levels. No radiopaque stones. Visualized bones appear intact. Residual contrast material in the decompressed colon. Surgical clips in the left upper quadrant. IMPRESSION: No evidence of active pulmonary disease. Nonobstructive bowel gas pattern. Postoperative changes in the left upper quadrant. Electronically Signed   By: Lucienne Capers M.D.   On: 05/21/2015 00:09   Dg Ugi  W/kub  05/20/2015  CLINICAL DATA:  Nausea and vomiting, vomiting of unspecified type. Symptoms for 4 weeks. Status post laparoscopic sleeve gastrectomy August 2016. EXAM: UPPER GI SERIES WITH KUB TECHNIQUE: After obtaining a scout radiograph a routine upper GI series was performed using thin barium. FLUOROSCOPY TIME:  Radiation Exposure Index (as provided by the fluoroscopic device): 261 dGy cm2 If the device does not provide the exposure index: Fluoroscopy  Time (in minutes and seconds):  1 minutes 36 seconds. Number of Acquired Images:  11 COMPARISON:  CT abdomen pelvis 04/24/2015 and postoperative upper GI a 03/30/2015. FINDINGS: Scout view of the abdomen shows a normal bowel gas pattern. Patient is status post laparoscopic sleeve gastrectomy. Patient was able to drink a minimal amount of thin barium due to nausea and vomiting. However, there is dilatation of the proximal third of the stomach with a persistent area of  narrowing at the junction of the proximal and middle thirds of the stomach. Contrast does pass through into the distal stomach. IMPRESSION: Dilated proximal stomach with a focal area of narrowing at the junction of the proximal and middle thirds of the stomach, status post gastric sleeve procedure. Electronically Signed   By: Lorin Picket M.D.   On: 05/20/2015 08:51    Anti-infectives: Anti-infectives    None      Assessment Active Problems:   Gastric outlet obstruction post gastric sleeve procedure (03/29/15)-?edema vs gastritis Hypokalemia-lab pending    LOS: 1 day   Plan: Check potassium and magnesium.  Try clear liquids.   Jatin Naumann J 05/22/2015

## 2015-05-22 NOTE — Progress Notes (Signed)
Pt feels like she has not tolerated clear liquids well today. She has had nausea all day that is decreased with zofran but never relieved, periods of dry heaves, and she vomited after taking a shower tonight. She coontinues to ambulate around the unit and has no other complaints besides the nausea. Continue to monitor. Hortencia Conradi RN

## 2015-05-22 NOTE — Progress Notes (Signed)
Patient c/o headache. Paged MD on call and orders received for tylenol.

## 2015-05-22 NOTE — Progress Notes (Signed)
Pt up ambulating around unit tolerating well. C/O nausea.

## 2015-05-23 ENCOUNTER — Inpatient Hospital Stay (HOSPITAL_COMMUNITY): Payer: 59 | Admitting: Anesthesiology

## 2015-05-23 ENCOUNTER — Encounter (HOSPITAL_COMMUNITY): Payer: Self-pay

## 2015-05-23 ENCOUNTER — Encounter (HOSPITAL_COMMUNITY): Admission: EM | Disposition: A | Discharge status: Home / Self Care | Payer: Self-pay | Source: Home / Self Care

## 2015-05-23 HISTORY — PX: ESOPHAGOGASTRODUODENOSCOPY (EGD) WITH PROPOFOL: SHX5813

## 2015-05-23 LAB — BASIC METABOLIC PANEL
ANION GAP: 5 (ref 5–15)
BUN: <5 mg/dL — ABNORMAL LOW (ref 6–20)
CALCIUM: 8.3 mg/dL — AB (ref 8.9–10.3)
CO2: 28 mmol/L (ref 22–32)
Chloride: 107 mmol/L (ref 101–111)
Creatinine, Ser: 0.56 mg/dL (ref 0.44–1.00)
Glucose, Bld: 108 mg/dL — ABNORMAL HIGH (ref 65–99)
Potassium: 3.7 mmol/L (ref 3.5–5.1)
Sodium: 140 mmol/L (ref 135–145)

## 2015-05-23 LAB — MAGNESIUM: Magnesium: 1.6 mg/dL — ABNORMAL LOW (ref 1.7–2.4)

## 2015-05-23 SURGERY — ESOPHAGOGASTRODUODENOSCOPY (EGD) WITH PROPOFOL
Anesthesia: Monitor Anesthesia Care

## 2015-05-23 MED ORDER — PROPOFOL 10 MG/ML IV BOLUS
INTRAVENOUS | Status: AC
  Filled 2015-05-23: qty 40

## 2015-05-23 MED ORDER — SUCCINYLCHOLINE CHLORIDE 20 MG/ML IJ SOLN
INTRAMUSCULAR | Status: DC | PRN
  Administered 2015-05-23: 140 mg via INTRAVENOUS

## 2015-05-23 MED ORDER — BUTAMBEN-TETRACAINE-BENZOCAINE 2-2-14 % EX AERO
INHALATION_SPRAY | CUTANEOUS | Status: DC | PRN
Start: 2015-05-23 — End: 2015-05-23
  Administered 2015-05-23: 3 via TOPICAL

## 2015-05-23 MED ORDER — FENTANYL CITRATE (PF) 100 MCG/2ML IJ SOLN
INTRAMUSCULAR | Status: AC
  Filled 2015-05-23: qty 4

## 2015-05-23 MED ORDER — LIDOCAINE HCL (CARDIAC) 20 MG/ML IV SOLN
INTRAVENOUS | Status: AC
  Filled 2015-05-23: qty 5

## 2015-05-23 MED ORDER — LACTATED RINGERS IV SOLN
INTRAVENOUS | Status: DC
  Administered 2015-05-23: 1000 mL via INTRAVENOUS
  Administered 2015-05-23: 14:00:00 via INTRAVENOUS

## 2015-05-23 MED ORDER — PROPOFOL 10 MG/ML IV BOLUS
INTRAVENOUS | Status: DC | PRN
  Administered 2015-05-23: 250 mg via INTRAVENOUS

## 2015-05-23 SURGICAL SUPPLY — 15 items

## 2015-05-23 NOTE — Plan of Care (Signed)
Problem: Phase III Progression Outcomes Goal: Pain controlled on oral analgesia Outcome: Adequate for Discharge Not complaining of pain,it's she's nauseated

## 2015-05-23 NOTE — Anesthesia Postprocedure Evaluation (Signed)
  Anesthesia Post-op Note  Patient: Lorraine Chambers  Procedure(s) Performed: Procedure(s): ESOPHAGOGASTRODUODENOSCOPY (EGD) WITH PROPOFOL (N/A)  Patient Location: PACU  Anesthesia Type:General  Level of Consciousness: awake and alert   Airway and Oxygen Therapy: Patient Spontanous Breathing  Post-op Pain: mild  Post-op Assessment: Post-op Vital signs reviewed, Patient's Cardiovascular Status Stable, Respiratory Function Stable, Patent Airway and No signs of Nausea or vomiting              Post-op Vital Signs: Reviewed and stable  Last Vitals:  Filed Vitals:   05/23/15 1458  BP: 132/74  Pulse: 92  Temp: 36.9 C  Resp: 23    Complications: No apparent anesthesia complications

## 2015-05-23 NOTE — Op Note (Signed)
05/20/2015 - 05/23/2015  2:53 PM  PATIENT:  Lorraine Chambers, 29 y.o., female, MRN: [id number]  PREOP DIAGNOSIS:  Possible gastric stricutre  POSTOP DIAGNOSIS:   Some dilatation of proximal stomach, no obvious stricture or ulcer  PROCEDURE:  Esophagogastrojejunoscopy  SURGEON:   Alphonsa Overall, M.D.  ANESTHESIA:   General  INDICATIONS FOR PROCEDURE:  Lorraine Chambers is a 29 y.o. (DOB: 1985-10-08)  AA  female whose primary care physician is Gerrit Heck, MD and comes for upper endoscopy to evaluate possible stricture.  The patient had a sleeve resection on 8/23/2016by Dr. Redmond Pulling.   Her original weight was 347, BMI - 65.6.   Her weight is 294 now.  I have reviewed the case with Dr. Redmond Pulling.   The indications and risks of the endoscopy were explained to the patient.  The risks include, but are not limited to, perforation, bleeding, or injury to the bowel.  If balloon dilatation is needed, the risk of perforation is higher.  PROCEDURE:  The patient was in room 3 at [facility] endoscopy unit.  Because of her nausea and my concern about controlling her airway, I had anesthesiology do the anesthesia.  It was their thought that she is best served with general anesthesia.  Findings include:   Esophagus:   Normal   GE junction at:  36 cm   Stomach pouch: Some "pouching" in the proximal 4 cm, otherwise normal tubular appearance of the sleeve.  There was no stricture, stenosis or ulcer.   Pylorus - Normal   Duodenum (to 2nd portion) - Normal  PLAN:   Photos taken and given to patient.    The patient is admitted.  Will need to control her nausea enough to get her home.  Alphonsa Overall, MD, [facility] Surgery Pager: [phone] Office phone:  [phone]

## 2015-05-23 NOTE — Transfer of Care (Signed)
Immediate Anesthesia Transfer of Care Note  Patient: Lorraine Chambers  Procedure(s) Performed: Procedure(s): ESOPHAGOGASTRODUODENOSCOPY (EGD) WITH PROPOFOL (N/A)  Patient Location: PACU  Anesthesia Type:General  Level of Consciousness:  sedated, patient cooperative and responds to stimulation  Airway & Oxygen Therapy:Patient Spontanous Breathing and Patient connected to face mask oxgen  Post-op Assessment:  Report given to PACU RN and Post -op Vital signs reviewed and stable  Post vital signs:  Reviewed and stable  Last Vitals:  Filed Vitals:   05/23/15 1350  BP: 128/79  Pulse: 70  Temp: 36.5 C  Resp: 18    Complications: No apparent anesthesia complications

## 2015-05-23 NOTE — Progress Notes (Signed)
Pt NPO, having an endoscopy this afternoon. Complaining of nausea

## 2015-05-23 NOTE — Progress Notes (Signed)
Pt ambulating around the unit frequently,tolerating well. Denies any pain.

## 2015-05-23 NOTE — Anesthesia Preprocedure Evaluation (Addendum)
Anesthesia Evaluation  Patient identified by MRN, date of birth, ID band Patient awake    Reviewed: Allergy & Precautions, NPO status , Patient's Chart, lab work & pertinent test results, reviewed documented beta blocker date and time   Airway Mallampati: II   Neck ROM: Full    Dental  (+) Teeth Intact, Dental Advisory Given   Pulmonary    breath sounds clear to auscultation       Cardiovascular hypertension, Pt. on medications  Rhythm:Regular  ECHO EF 60%   Neuro/Psych  Headaches,    GI/Hepatic GERD  Medicated,  Endo/Other  diabetes, Type 2Morbid obesity  Renal/GU      Musculoskeletal   Abdominal (+) + obese,   Peds  Hematology   Anesthesia Other Findings   Reproductive/Obstetrics                            Anesthesia Physical Anesthesia Plan  ASA: III  Anesthesia Plan: MAC   Post-op Pain Management:    Induction: Intravenous  Airway Management Planned: Nasal Cannula  Additional Equipment:   Intra-op Plan:   Post-operative Plan:   Informed Consent: I have reviewed the patients History and Physical, chart, labs and discussed the procedure including the risks, benefits and alternatives for the proposed anesthesia with the patient or authorized representative who has indicated his/her understanding and acceptance.     Plan Discussed with:   Anesthesia Plan Comments:         Anesthesia Quick Evaluation

## 2015-05-23 NOTE — Progress Notes (Signed)
General Surgery Note  LOS: 2 days  POD -     Assessment/Plan: 1.  Gastric stricture  On Protonix Unclear what caused this.  Discussed with Dr. Redmond Pulling and reviewed his op note. Will plan upper endo. Discussed indications/complications with the patient.  I will plan to do this later this afternoon.  2.  Sleeve gastrectomy - [facility] - 03/29/2015  3.  Morbid obesity -   Initial weight 347 and BMI - 65.6  Now down to 294. 4.  DM  Glucose - 108 - 05/23/2015 5.  HTN 6.  Hypothyroidism  7.  DVT prophylaxis - Lovenox  Active Problems:   Gastric outlet obstruction  Subjective:  She cannot keep anything down.  She has not pain, just develops nausea and vomiting, even after eating/drinking a little.  She works as Microbiologist at [employer]. Objective:   Filed Vitals:   05/23/15 0457  BP: 110/68  Pulse: 76  Temp: 97.6 F (36.4 C)  Resp: 18     Intake/Output from previous day:  10/16 0701 - 10/17 0700 In: 2308.3 [P.O.:1200; I.V.:1058.3; IV Piggyback:50] Out: 250 [Urine:250]  Intake/Output this shift:      Physical Exam:   General: Obese AA F who is alert and oriented.    HEENT: Normal. Pupils equal. .   Lungs: Clear   Abdomen: soft.  No localized tenderness or mass.   Wound: Well healed.   Lab Results:    Recent Labs  05/20/15 2250  WBC 9.1  HGB 12.8  HCT 40.3  PLT 226    BMET   Recent Labs  05/22/15 1029 05/23/15 0010  NA 141 140  K 3.6 3.7  CL 107 107  CO2 28 28  GLUCOSE 124* 108*  BUN <5* <5*  CREATININE 0.44 0.56  CALCIUM 8.1* 8.3*    PT/INR  No results for input(s): LABPROT, INR in the last 72 hours.  ABG  No results for input(s): PHART, HCO3 in the last 72 hours.  Invalid input(s): PCO2, PO2   Studies/Results:  No results found.   Anti-infectives:   Anti-infectives    None      Alphonsa Overall, MD, FACS Pager: [phone] Surgery Office: [phone] 05/23/2015

## 2015-05-23 NOTE — Progress Notes (Signed)
Attempted to call report to RN, but unable to take because she is in the middle of transferring. Will call back in 20 min per her request. Richardean Sale, RN

## 2015-05-23 NOTE — Progress Notes (Signed)
Pt picked up for ENDO.

## 2015-05-23 NOTE — Anesthesia Procedure Notes (Signed)
Procedure Name: Intubation Date/Time: 05/23/2015 2:35 PM Performed by: Deliah Boston Pre-anesthesia Checklist: Patient identified, Emergency Drugs available, Suction available and Patient being monitored Patient Re-evaluated:Patient Re-evaluated prior to inductionOxygen Delivery Method: Circle System Utilized Preoxygenation: Pre-oxygenation with 100% oxygen Intubation Type: IV induction Ventilation: Mask ventilation without difficulty Laryngoscope Size: Mac and 3 Grade View: Grade I Tube type: Oral Tube size: 7.0 mm Number of attempts: 1 Airway Equipment and Method: Stylet and Oral airway Placement Confirmation: ETT inserted through vocal cords under direct vision,  positive ETCO2 and breath sounds checked- equal and bilateral Secured at: 21 cm Tube secured with: Tape Dental Injury: Teeth and Oropharynx as per pre-operative assessment

## 2015-05-24 ENCOUNTER — Encounter (HOSPITAL_COMMUNITY): Payer: Self-pay | Admitting: Surgery

## 2015-05-24 LAB — COMPREHENSIVE METABOLIC PANEL
ALBUMIN: 2.6 g/dL — AB (ref 3.5–5.0)
ALK PHOS: 28 U/L — AB (ref 38–126)
ALT: 43 U/L (ref 14–54)
ANION GAP: 3 — AB (ref 5–15)
AST: 30 U/L (ref 15–41)
BUN: <5 mg/dL — ABNORMAL LOW (ref 6–20)
CHLORIDE: 113 mmol/L — AB (ref 101–111)
CO2: 27 mmol/L (ref 22–32)
Calcium: 8.5 mg/dL — ABNORMAL LOW (ref 8.9–10.3)
Creatinine, Ser: 0.5 mg/dL (ref 0.44–1.00)
GFR calc non Af Amer: >60 mL/min (ref >60)
GLUCOSE: 104 mg/dL — AB (ref 65–99)
POTASSIUM: 4.7 mmol/L (ref 3.5–5.1)
SODIUM: 143 mmol/L (ref 135–145)
Total Bilirubin: 1.3 mg/dL — ABNORMAL HIGH (ref 0.3–1.2)
Total Protein: 5.7 g/dL — ABNORMAL LOW (ref 6.5–8.1)

## 2015-05-24 LAB — PREALBUMIN: Prealbumin: 15.1 mg/dL — ABNORMAL LOW (ref 18–38)

## 2015-05-24 MED ORDER — SODIUM CHLORIDE 0.9 % IJ SOLN: 3.0000 mL | INTRAMUSCULAR | Status: DC | PRN

## 2015-05-24 MED ORDER — PREMIER PROTEIN SHAKE
11.0000 [oz_av] | Freq: Three times a day (TID) | ORAL | Status: DC
  Administered 2015-05-24 – 2015-05-28 (×12): 11 [oz_av] via ORAL
  Filled 2015-05-24 (×7): qty 325.31

## 2015-05-24 MED ORDER — SODIUM CHLORIDE 0.9 % IJ SOLN
3.0000 mL | Freq: Two times a day (BID) | INTRAMUSCULAR | Status: DC
  Administered 2015-05-24 – 2015-05-27 (×2): 3 mL via INTRAVENOUS

## 2015-05-24 NOTE — Plan of Care (Signed)
Problem: Phase II Progression Outcomes Goal: Progress activity as tolerated unless otherwise ordered Outcome: Completed/Met Date Met:  05/24/15 Walked laps in halls x2 this am

## 2015-05-24 NOTE — Progress Notes (Signed)
Patient ID: Lorraine Chambers, female   DOB: 1985/09/08, 29 y.o.   MRN: [id number]     [address] [place], [place] [address]    Phone: [phone] FAX: [phone]     Subjective: Prealbumin 15.1. Intermittent nausea, but tolerating clears.  Wants iv stopped.   No vomiting. Afebrile.  VSS.    Objective:  Vital signs:  Filed Vitals:   05/23/15 1510 05/23/15 1601 05/23/15 2047 05/24/15 0554  BP: 137/62 112/53 134/80 125/83  Pulse: 91 79 71 68  Temp:  97.5 F (36.4 C) 98 F (36.7 C) 97.9 F (36.6 C)  TempSrc:  Oral Oral Oral  Resp: 20 20 16 18   Height:      Weight:    141.432 kg (311 lb 12.8 oz)  SpO2: 99% 100% 100% 100%    Last BM Date: 05/23/15  Intake/Output   Yesterday:  10/17 0701 - 10/18 0700 In: 4950.8 [P.O.:1380; I.V.:3570.8] Out: 1351 [Urine:1350; Stool:1] This shift:     Physical Exam: General: Pt awake/alert/oriented x4 in no acute distress Abdomen: Soft.  Nondistended.  Non tender.  No evidence of peritonitis.  No incarcerated hernias.   Problem List:   Active Problems:   Gastric outlet obstruction    Results:   Labs: Results for orders placed or performed during the hospital encounter of 05/20/15 (from the past 48 hour(s))  Basic metabolic panel     Status: Abnormal   Collection Time: 05/22/15 10:29 AM  Result Value Ref Range   Sodium 141 135 - 145 mmol/L   Potassium 3.6 3.5 - 5.1 mmol/L    Comment: RESULT REPEATED AND VERIFIED DELTA CHECK NOTED    Chloride 107 101 - 111 mmol/L   CO2 28 22 - 32 mmol/L   Glucose, Bld 124 (H) 65 - 99 mg/dL   BUN <5 (L) 6 - 20 mg/dL   Creatinine, Ser 0.44 0.44 - 1.00 mg/dL   Calcium 8.1 (L) 8.9 - 10.3 mg/dL   GFR calc non Af Amer >60 >60 mL/min   GFR calc Af Amer >60 >60 mL/min    Comment: (NOTE) The eGFR has been calculated using the CKD EPI equation. This calculation has not been validated in all clinical situations. eGFR's  persistently <60 mL/min signify possible Chronic Kidney Disease.    Anion gap 6 5 - 15  Magnesium     Status: Abnormal   Collection Time: 05/22/15 10:29 AM  Result Value Ref Range   Magnesium 1.3 (L) 1.7 - 2.4 mg/dL  Basic metabolic panel     Status: Abnormal   Collection Time: 05/23/15 12:10 AM  Result Value Ref Range   Sodium 140 135 - 145 mmol/L   Potassium 3.7 3.5 - 5.1 mmol/L   Chloride 107 101 - 111 mmol/L   CO2 28 22 - 32 mmol/L   Glucose, Bld 108 (H) 65 - 99 mg/dL   BUN <5 (L) 6 - 20 mg/dL   Creatinine, Ser 0.56 0.44 - 1.00 mg/dL   Calcium 8.3 (L) 8.9 - 10.3 mg/dL   GFR calc non Af Amer >60 >60 mL/min   GFR calc Af Amer >60 >60 mL/min    Comment: (NOTE) The eGFR has been calculated using the CKD EPI equation. This calculation has not been validated in all clinical situations. eGFR's persistently <60 mL/min signify possible Chronic Kidney Disease.    Anion gap 5 5 - 15  Magnesium  Status: Abnormal   Collection Time: 05/23/15 12:10 AM  Result Value Ref Range   Magnesium 1.6 (L) 1.7 - 2.4 mg/dL  Comprehensive metabolic panel     Status: Abnormal   Collection Time: 05/24/15  5:25 AM  Result Value Ref Range   Sodium 143 135 - 145 mmol/L   Potassium 4.7 3.5 - 5.1 mmol/L    Comment: DELTA CHECK NOTED NO VISIBLE HEMOLYSIS    Chloride 113 (H) 101 - 111 mmol/L   CO2 27 22 - 32 mmol/L   Glucose, Bld 104 (H) 65 - 99 mg/dL   BUN <5 (L) 6 - 20 mg/dL   Creatinine, Ser 0.50 0.44 - 1.00 mg/dL   Calcium 8.5 (L) 8.9 - 10.3 mg/dL   Total Protein 5.7 (L) 6.5 - 8.1 g/dL   Albumin 2.6 (L) 3.5 - 5.0 g/dL   AST 30 15 - 41 U/L   ALT 43 14 - 54 U/L   Alkaline Phosphatase 28 (L) 38 - 126 U/L   Total Bilirubin 1.3 (H) 0.3 - 1.2 mg/dL   GFR calc non Af Amer >60 >60 mL/min   GFR calc Af Amer >60 >60 mL/min    Comment: (NOTE) The eGFR has been calculated using the CKD EPI equation. This calculation has not been validated in all clinical situations. eGFR's persistently <60 mL/min  signify possible Chronic Kidney Disease.    Anion gap 3 (L) 5 - 15  Prealbumin     Status: Abnormal   Collection Time: 05/24/15  5:25 AM  Result Value Ref Range   Prealbumin 15.1 (L) 18 - 38 mg/dL    Comment: Performed at [facility] / Studies: No results found.  Medications / Allergies:  Scheduled Meds: . antiseptic oral rinse  7 mL Mouth Rinse q12n4p  . chlorhexidine  15 mL Mouth Rinse BID  . enoxaparin (LOVENOX) injection  40 mg Subcutaneous Q24H  . pantoprazole (PROTONIX) IV  40 mg Intravenous Q12H  . sodium chloride  3 mL Intravenous Q12H   Continuous Infusions:  PRN Meds:.acetaminophen, ondansetron **OR** ondansetron (ZOFRAN) IV, promethazine, sodium chloride  Antibiotics: Anti-infectives    None        Assessment/Plan S/p sleeve gastrectomy---Dr. Redmond Pulling 03/29/15 Nausea/vomiting POD#1 EDG - D. Delio Slates - 05/23/2015 -no stricture or ulcer visualized   -will continue with PPI, advance to fulls and to remain on fulls until N/V resolve.  Maximize anti-emetics. FEN-DC IVF DM II-CBGs are stable.  VTE prophylaxis-SCD/lovenox  Dispo-if no improvement CT in AM  Erby Pian, [facility] Pager [phone](7A-4:30P)   05/24/2015 8:56 AM  Agree with above.  Alphonsa Overall, MD, [facility] Pager: [phone] Office phone:  [phone]

## 2015-05-24 NOTE — Progress Notes (Signed)
Patient alert and oriented, after endoscopy on Friday morning.  Provided support and encouragement.  Encouraged small sips of liquids throughout the day.  Will speak with attending MD per patient has bariatric fulls ordered but no protein shakes.  Will need to start bariatric protein shake three times per day, volume unrestricted.  All questions answered.  Will continue to monitor.

## 2015-05-24 NOTE — Progress Notes (Signed)
Patient requested for peripheral IV to be removed.  Patient reports tenderness at site.  Patient states she is going home tomorrow and ready for it to be removed.  Patient states that she agrees to another IV if necessary.

## 2015-05-25 ENCOUNTER — Ambulatory Visit: Payer: 59 | Admitting: Dietician

## 2015-05-25 MED ORDER — PANTOPRAZOLE SODIUM 40 MG PO TBEC
40.0000 mg | DELAYED_RELEASE_TABLET | Freq: Two times a day (BID) | ORAL | Status: DC
  Administered 2015-05-25 (×2): 40 mg via ORAL
  Filled 2015-05-25 (×2): qty 1

## 2015-05-25 MED ORDER — PROMETHAZINE HCL 6.25 MG/5ML PO SYRP
12.5000 mg | ORAL_SOLUTION | ORAL | Status: DC | PRN
  Administered 2015-05-25 – 2015-05-27 (×3): 12.5 mg via ORAL
  Filled 2015-05-25 (×8): qty 10

## 2015-05-25 MED ORDER — PREMIER PROTEIN SHAKE: 11.0000 [oz_av] | Freq: Three times a day (TID) | ORAL | Status: DC

## 2015-05-25 MED ORDER — PROMETHAZINE HCL 6.25 MG/5ML PO SYRP: 6.2500 mg | ORAL_SOLUTION | Freq: Four times a day (QID) | ORAL | Status: DC | PRN

## 2015-05-25 NOTE — Progress Notes (Signed)
Pharmacy IV to PO conversion  The patient is receiving PANTOPRAZOLE by the intravenous route.  Based on criteria approved by the Pharmacy and [facility], the medication is being converted to the equivalent oral dose form.   No active GI bleeding or impaired absorption  Not s/p esophagectomy  Documented ability to take oral medications for > 24 hr  Plan to continue treatment for at least 1 day  If you have any questions about this conversion, please contact the Pharmacy Department (ext [phone]).  Thank you.  Reuel Boom, PharmD Pager: [phone] 05/25/2015, 9:54 AM

## 2015-05-25 NOTE — Progress Notes (Signed)
Still having nausea and spitting stuff up.  Unable to take much PO all day.  I will keep her tonight and check labs on her tomorrow.  Agree with above.  Alphonsa Overall, MD, [facility] Surgery Pager: [phone] Office phone:  [phone]

## 2015-05-25 NOTE — Progress Notes (Signed)
Patient ID: Lorraine Chambers, female   DOB: 09/16/1985, 29 y.o.   MRN: [id number]     [facility]      [address]   [place], [place] [address]    Phone: [phone] FAX: [phone]     Subjective: Nausea.  No vomiting.  Finished shake.  Some PO intake.  Passing flatus.   Objective:  Vital signs:  Filed Vitals:   05/24/15 0554 05/24/15 1308 05/24/15 2100 05/25/15 0515  BP: 125/83 122/75 117/79 115/63  Pulse: 68 73 82 81  Temp: 97.9 F (36.6 C) 98.1 F (36.7 C) 98.3 F (36.8 C) 97.8 F (36.6 C)  TempSrc: Oral Oral Oral Oral  Resp: 18 18 16 16  Height:      Weight: 141.432 kg (311 lb 12.8 oz)   140.252 kg (309 lb 3.2 oz)  SpO2: 100% 99% 99% 98%    Last BM Date: 05/23/15  Intake/Output   Yesterday:  10/18 0701 - 10/19 0700 In: 1285 [P.O.:1285] Out: 750 [Urine:750] This shift:  Physical Exam: General: Pt awake/alert/oriented x4 in no acute distress Abdomen: Soft. Nondistended. Non tender. No evidence of peritonitis. No incarcerated hernias.  Problem List:   Active Problems:   Gastric outlet obstruction    Results:   Labs: Results for orders placed or performed during the hospital encounter of 05/20/15 (from the past 48 hour(s))  Comprehensive metabolic panel     Status: Abnormal   Collection Time: 05/24/15  5:25 AM  Result Value Ref Range   Sodium 143 135 - 145 mmol/L   Potassium 4.7 3.5 - 5.1 mmol/L    Comment: DELTA CHECK NOTED NO VISIBLE HEMOLYSIS    Chloride 113 (H) 101 - 111 mmol/L   CO2 27 22 - 32 mmol/L   Glucose, Bld 104 (H) 65 - 99 mg/dL   BUN <5 (L) 6 - 20 mg/dL   Creatinine, Ser 0.50 0.44 - 1.00 mg/dL   Calcium 8.5 (L) 8.9 - 10.3 mg/dL   Total Protein 5.7 (L) 6.5 - 8.1 g/dL   Albumin 2.6 (L) 3.5 - 5.0 g/dL   AST 30 15 - 41 U/L   ALT 43 14 - 54 U/L   Alkaline Phosphatase 28 (L) 38 - 126 U/L   Total Bilirubin 1.3 (H) 0.3 - 1.2 mg/dL   GFR calc non Af Amer >60 >60 mL/min   GFR  calc Af Amer >60 >60 mL/min    Comment: (NOTE) The eGFR has been calculated using the CKD EPI equation. This calculation has not been validated in all clinical situations. eGFR's persistently <60 mL/min signify possible Chronic Kidney Disease.    Anion gap 3 (L) 5 - 15  Prealbumin     Status: Abnormal   Collection Time: 05/24/15  5:25 AM  Result Value Ref Range   Prealbumin 15.1 (L) 18 - 38 mg/dL    Comment: Performed at [facility]    Imaging / Studies: No results found.  Medications / Allergies:  Scheduled Meds: . antiseptic oral rinse  7 mL Mouth Rinse q12n4p  . chlorhexidine  15 mL Mouth Rinse BID  . enoxaparin (LOVENOX) injection  40 mg Subcutaneous Q24H  . pantoprazole (PROTONIX) IV  40 mg Intravenous Q12H  . protein supplement shake  11 oz Oral TID WC & HS  . sodium chloride  3 mL Intravenous Q12H   Continuous Infusions:  PRN Meds:.acetaminophen, ondansetron **OR** ondansetron (ZOFRAN) IV, promethazine, sodium chloride  Antibiotics: Anti-infectives      None      Assessment/Plan S/p sleeve gastrectomy---Dr. Redmond Pulling 03/29/15 Nausea/vomiting POD#2 EDG - Lorraine Chambers - 05/23/2015 -no stricture or ulcer visualized  -will continue with PPI, bariatric fulls and shakes. phenergan.  zofran does not work according to the patient.   FEN-encourage PO DM II-CBGs are stable.  VTE prophylaxis-SCD/lovenox  Dispo-likely DC home, but wants to speak with Dr. Rex Kras, [facility] Surgery Pager [phone](7A-4:30P)   05/25/2015  8:53 AM  Still with nausea.  Not able to go home yet. Lorraine Chambers in room. Now trouble starting IV's. Will give Phenergan orally and see how she does.  Alphonsa Overall, MD, [facility] Surgery Pager: [phone] Office phone:  [phone]

## 2015-05-26 ENCOUNTER — Ambulatory Visit: Payer: 59 | Admitting: Cardiovascular Disease

## 2015-05-26 LAB — BASIC METABOLIC PANEL
ANION GAP: 7 (ref 5–15)
CO2: 28 mmol/L (ref 22–32)
CREATININE: 0.37 mg/dL — AB (ref 0.44–1.00)
Calcium: 8.7 mg/dL — ABNORMAL LOW (ref 8.9–10.3)
Chloride: 103 mmol/L (ref 101–111)
GFR calc non Af Amer: >60 mL/min (ref >60)
GLUCOSE: 81 mg/dL (ref 65–99)
Potassium: 3.8 mmol/L (ref 3.5–5.1)
Sodium: 138 mmol/L (ref 135–145)

## 2015-05-26 LAB — CBC
HCT: 34.7 % — ABNORMAL LOW (ref 36.0–46.0)
Hemoglobin: 10.9 g/dL — ABNORMAL LOW (ref 12.0–15.0)
MCH: 24.7 pg — ABNORMAL LOW (ref 26.0–34.0)
MCHC: 31.4 g/dL (ref 30.0–36.0)
MCV: 78.5 fL (ref 78.0–100.0)
PLATELETS: 241 10*3/uL (ref 150–400)
RBC: 4.42 MIL/uL (ref 3.87–5.11)
RDW: 15 % (ref 11.5–15.5)
WBC: 8.6 10*3/uL (ref 4.0–10.5)

## 2015-05-26 MED ORDER — CHLORHEXIDINE GLUCONATE 0.12 % MT SOLN
OROMUCOSAL | Status: AC
  Administered 2015-05-26: 23:00:00
  Filled 2015-05-26: qty 15

## 2015-05-26 MED ORDER — PANTOPRAZOLE SODIUM 40 MG PO PACK
40.0000 mg | PACK | Freq: Two times a day (BID) | ORAL | Status: DC
  Administered 2015-05-26 – 2015-05-27 (×3): 40 mg via ORAL
  Filled 2015-05-26 (×6): qty 20

## 2015-05-26 MED ORDER — OMEPRAZOLE 2 MG/ML ORAL SUSPENSION: 20.0000 mg | Freq: Two times a day (BID) | ORAL | Status: DC

## 2015-05-26 NOTE — Care Management Note (Signed)
Case Management Note  Patient Details  Name: Lorraine Chambers MRN: [id number] Date of Birth: September 04, 1985  Subjective/Objective:              29 yo admitted with Gastric outlet obstruction      Action/Plan: From home  Expected Discharge Date:  05/23/15               Expected Discharge Plan:  Home/Self Care  In-House Referral:     Discharge planning Services  CM Consult  Post Acute Care Choice:    Choice offered to:     DME Arranged:    DME Agency:     HH Arranged:    [place] Agency:     Status of Service:  In process, will continue to follow  Medicare Important Message Given:    Date Medicare IM Given:    Medicare IM give by:    Date Additional Medicare IM Given:    Additional Medicare Important Message give by:     If discussed at [place] of Stay Meetings, dates discussed:    Additional Comments: Chart reviewed and CM following for DC needs. Lynnell Catalan, RN 05/26/2015, 2:36 PM

## 2015-05-26 NOTE — Consult Note (Signed)
   [facility] CM Inpatient Consult   05/26/2015  Lorraine Chambers 1985-12-23 [id number]   Came to visit patient at bedside. She has been active with the [facility] program for DM management in the recent past for [employer] employees/dependents with [facility] insurance. She endorses she did not follow up with [facility] to [facility] after she had surgery. States she cannot remember if she called to cancel appointment or not. She reports she no longer takes Metformin as her hemoglobin a1c has gotten better. Gave her the new address of [facility] building and contact information. Will follow up with [facility] to [facility] as well. [name] appreciative of visit.    Marthenia Rolling, MSN-Ed, RN,BSN [facility] Liaison [phone]

## 2015-05-26 NOTE — Progress Notes (Signed)
Initial Nutrition Assessment  DOCUMENTATION CODES:   Morbid obesity  INTERVENTION:   Calorie Count in progress Continue Premier Protein shakes TID, each provides 160 kcal and 30g protein Encouraged small frequent sips of shakes and liquids throughout the day. RD to continue to monitor for additional needs  NUTRITION DIAGNOSIS:   Inadequate oral intake related to inability to eat, nausea, vomiting as evidenced by per patient/family report, meal completion < 25%.  GOAL:   Patient will meet greater than or equal to 90% of their needs  MONITOR:   PO intake, Supplement acceptance, Labs, Weight trends, I & O's  REASON FOR ASSESSMENT:   Other (Comment), Consult Calorie Count  ASSESSMENT:   29 year old female who underwent laparoscopic gastric sleeve for morbid obesity 03/29/2015 by Dr. Redmond Pulling. She did well for 3 weeks. At the 3 week mark, she was doing Abdominal exercises at the gym. Following this, she began having difficulty with nausea and vomiting. This was initially to solids but not liquids. However, it has now progressed to liquids. She has been persistently vomiting without being able to keep any oral intake down. She had a upper GI yesterday which demonstrated a significant narrowing at the junction of the proximal and middle stomach like a string sign. She subsequently came in last night because of persistent nausea and vomiting is noted have a hypokalemic metabolic alkalosis.   Patient reports intolerance of liquids and solids PTA d/t N/V. Pt states she feels full very quickly and most things come back up. She vomited twice yesterday. She has not been drinking her protein shakes since starting a solid diet. Pt reports feeling nauseous during RD visit but states she has drank 1/2 of a Premier protein shake so far today. Encouraged her to sip slowly and frequently when she does not feel full. Offered other protein options but she would like to stick with Premier only.  Since  her gastric sleeve surgery in August, patient has lost 38 lb.  Calorie count in effect to assess patient's intake.  Labs reviewed: Low Na, BUN, Creatinine, Mg  Diet Order:  Diet bariatric full liquid Room service appropriate?: Yes; Fluid consistency:: Thin  Skin:  Reviewed, no issues  Last BM:  10/17  Height:   Ht Readings from Last 1 Encounters:  05/21/15 5' (1.524 m)    Weight:   Wt Readings from Last 1 Encounters:  05/26/15 305 lb (138.347 kg)    Ideal Body Weight:     BMI:  Body mass index is 59.57 kg/(m^2).  Estimated Nutritional Needs:   Kcal:  1500-1700  Protein:  >/= 60g  Fluid:  2L/day  EDUCATION NEEDS:   No education needs identified at this time  Clayton Bibles, MS, RD, LDN Pager: [phone] After Hours Pager: [phone]

## 2015-05-26 NOTE — Progress Notes (Signed)
3 Days Post-Op  Subjective: Only has discomfort when has emesis. Threw up 2x yesterday - bile. Kept water down. Didn't try shake.   Objective: Vital signs in last 24 hours: Temp:  [98.3 F (36.8 C)-98.5 F (36.9 C)] 98.3 F (36.8 C) (10/20 0538) Pulse Rate:  [74-86] 74 (10/20 0538) Resp:  [18-20] 20 (10/20 0538) BP: (114-138)/(67-95) 114/67 mmHg (10/20 0538) SpO2:  [94 %-100 %] 94 % (10/20 0538) Weight:  [138.347 kg (305 lb)] 138.347 kg (305 lb) (10/20 0538) Last BM Date: 05/23/15  Intake/Output from previous day:   Intake/Output this shift:    Resting comfortably, nontoxic cta  Reg Soft, nt  Lab Results:   Recent Labs  05/26/15 0450  WBC 8.6  HGB 10.9*  HCT 34.7*  PLT 241   BMET  Recent Labs  05/24/15 0525 05/26/15 0450  NA 143 138  K 4.7 3.8  CL 113* 103  CO2 27 28  GLUCOSE 104* 81  BUN <5* <5*  CREATININE 0.50 0.37*  CALCIUM 8.5* 8.7*   PT/INR No results for input(s): LABPROT, INR in the last 72 hours. ABG No results for input(s): PHART, HCO3 in the last 72 hours.  Invalid input(s): PCO2, PO2  Studies/Results: No results found.  Anti-infectives: Anti-infectives    None      Assessment/Plan: S/p sleeve gastrectomy 03/2015 Nausea/vomiting s/p Procedure(s): ESOPHAGOGASTRODUODENOSCOPY (EGD) WITH PROPOFOL (N/A)  Discussed with pt trying some shake today. Not ready to go home until she can tolerate shake.  Will try liquid PPI - pill a little difficult per pt If has ongoing PO difficulty will need to address nutritional status - right now acceptable.  Cont vte prophylaxis.   Leighton Ruff. Redmond Pulling, MD, FACS General, Bariatric, & Minimally Invasive Surgery [facility], [place]   LOS: 5 days    [name] 05/26/2015

## 2015-05-26 NOTE — Progress Notes (Signed)
Calorie Count Note  48 hour calorie count ordered.  Please hang calorie count envelope on the patient's door. Document percent consumed for each item on the patient's meal tray ticket and keep in envelope. Also document percent of any supplement or snack pt consumes and keep documentation in envelope for RD to review.   Clayton Bibles, MS, RD, LDN Pager: [phone] After Hours Pager: [phone]

## 2015-05-26 NOTE — Progress Notes (Signed)
Patient alert and oriented.    Provided support and encouragement.  Encouraged ambulation and small sips of liquids.  Patient has tolerated approximately 1 protein shake today, but was not drinking other fluids.  Patient has bariatric full liquid diet ordered, encouraged her to try to increase her fluid intake and order a meal.  Patient ordered while I was at the bedside, her outlook is much improved since yesterday, but still needs to work on increasing her intake.  All questions answered.  Will continue to monitor.

## 2015-05-27 MED ORDER — METOCLOPRAMIDE HCL 5 MG/ML IJ SOLN
10.0000 mg | Freq: Three times a day (TID) | INTRAMUSCULAR | Status: DC
  Administered 2015-05-27 – 2015-05-29 (×6): 10 mg via INTRAVENOUS
  Filled 2015-05-27 (×6): qty 2

## 2015-05-27 MED ORDER — KCL IN DEXTROSE-NACL 20-5-0.45 MEQ/L-%-% IV SOLN
INTRAVENOUS | Status: DC
  Administered 2015-05-27 – 2015-05-29 (×3): via INTRAVENOUS
  Filled 2015-05-27 (×4): qty 1000

## 2015-05-27 NOTE — Progress Notes (Signed)
Patient alert and oriented, provided support and encouragement.  Encouraged ambulation and small sips of liquids.  Today patient has tolerated 1/2 a protein shake (5-6 oz), 2-3 oz cranberry juice, and 1-2 oz of water.  Patient did vomit her protonix but per patient the suspension liquid is not palatable, she would prefer IV protonix or the pill form of this medication.  She continues to have nausea with full liquids, I am concerned with her fluid intake, and her ability to hydrate herself.  I have encouraged her to increase her intake this afternoon and attempt to get the remaining 1/2 of a protein shake by 5 pm, as well as approximately 6oz of water by 5pm.  Offered strategies to increase intake, standing vs sitting when drinking,  Try warm beverages vs. Cold beverages.  All questions answered.  Will continue to monitor.

## 2015-05-27 NOTE — Progress Notes (Signed)
Had several conversations with pt today regarding PO intake. I've encouraged her to drink water and cranberry juice and so far has only drank half of a protein shake. IV team currently in room attempting peripheral line

## 2015-05-27 NOTE — Progress Notes (Signed)
4 Days Post-Op  Subjective: Not really have much nausea. No pain. Didn't fall asleep til 0430 - not bc of pain/nausea. Drank whole shake yesterday and pineapple juice. Didn't like taste of liquid PPI  Objective: Vital signs in last 24 hours: Temp:  [98.3 F (36.8 C)-98.7 F (37.1 C)] 98.3 F (36.8 C) (10/21 0615) Pulse Rate:  [78-82] 78 (10/21 0615) Resp:  [18-20] 20 (10/21 0615) BP: (112-131)/(61-69) 112/61 mmHg (10/21 0615) SpO2:  [95 %-100 %] 95 % (10/21 0615) Weight:  [136.669 kg (301 lb 4.8 oz)] 136.669 kg (301 lb 4.8 oz) (10/21 0615) Last BM Date: 05/26/15  Intake/Output from previous day: 10/20 0701 - 10/21 0700 In: 770 [P.O.:770] Out: 401 [Urine:401] Intake/Output this shift:    Asleep, easily awakens, smiling. Nontoxic cta b/l Reg Obese, soft, nt, nd  Lab Results:   Recent Labs  05/26/15 0450  WBC 8.6  HGB 10.9*  HCT 34.7*  PLT 241   BMET  Recent Labs  05/26/15 0450  NA 138  K 3.8  CL 103  CO2 28  GLUCOSE 81  BUN <5*  CREATININE 0.37*  CALCIUM 8.7*   PT/INR No results for input(s): LABPROT, INR in the last 72 hours. ABG No results for input(s): PHART, HCO3 in the last 72 hours.  Invalid input(s): PCO2, PO2  Studies/Results: No results found.  Anti-infectives: Anti-infectives    None      Assessment/Plan: S/p sleeve gastrectomy 03/2015 Nausea/vomiting s/p Procedure(s): ESOPHAGOGASTRODUODENOSCOPY (EGD) WITH PROPOFOL (N/A)  PO is improving. Still AFVSS.  No pain.  Will see how she does this am/early afternoon and make decision about dc at that time Ambulate  Leighton Ruff. Redmond Pulling, MD, FACS General, Bariatric, & Minimally Invasive Surgery [facility], [place]   LOS: 6 days    [name] 05/27/2015

## 2015-05-27 NOTE — Progress Notes (Signed)
Spoke with bariatric nurse. Pt's PO intake not safe for discharge. Only 1/2 shake so far today and some water.  Will restart some IVF since not great PO past 2 days Start reglan as well.  If still struggling with PO intake over weekend, will d/w patient placing PICC line to send home so we can order IVF several times a week.   Leighton Ruff. Redmond Pulling, MD, FACS General, Bariatric, & Minimally Invasive Surgery [facility], [place]

## 2015-05-27 NOTE — Progress Notes (Signed)
Nutrition Follow-up  DOCUMENTATION CODES:   Morbid obesity  INTERVENTION:  Calorie Count in progress Continue Premier Protein shakes TID, each provides 160 kcal and 30g protein Encouraged small frequent sips of shakes and liquids throughout the day. RD to continue to monitor for additional needs   NUTRITION DIAGNOSIS:   Inadequate oral intake related to inability to eat, nausea, vomiting as evidenced by per patient/family report, meal completion < 25%.  ongoing  GOAL:   Patient will meet greater than or equal to 90% of their needs  Not yet met  MONITOR:   PO intake, Supplement acceptance, Labs, Weight trends, I & O's  REASON FOR ASSESSMENT:   Other (Comment), Consult Calorie Count  ASSESSMENT:   29 year old female who underwent laparoscopic gastric sleeve for morbid obesity 03/29/2015 by Dr. Redmond Pulling. She did well for 3 weeks. At the 3 week mark, she was doing Abdominal exercises at the gym. Following this, she began having difficulty with nausea and vomiting. This was initially to solids but not liquids. However, it has now progressed to liquids. She has been persistently vomiting without being able to keep any oral intake down. She had a upper GI yesterday which demonstrated a significant narrowing at the junction of the proximal and middle stomach like a string sign. She subsequently came in last night because of persistent nausea and vomiting is noted have a hypokalemic metabolic alkalosis.   10/20 - Patient reports intolerance of liquids and solids PTA d/t N/V. Pt states she feels full very quickly and most things come back up. She vomited twice yesterday. She has not been drinking her protein shakes since starting a solid diet. Pt reports feeling nauseous during RD visit but states she has drank 1/2 of a Premier protein shake so far today. Encouraged her to sip slowly and frequently when she does not feel full. Offered other protein options but she would like to stick with  Premier only.  Since her gastric sleeve surgery in August, patient has lost 38 lb.  Calorie count in effect to assess patient's intake.  Labs reviewed: Low Na, BUN, Creatinine, Mg  10/21 - Calorie count was ordered yesterday, but not completed when RD came by to pick it up this morning. Requested that nurse of patient do count. RD returned to check at 1420, count had not been done.  Diet Order:  Diet bariatric full liquid Room service appropriate?: Yes; Fluid consistency:: Thin  Skin:  Reviewed, no issues  Last BM:  10/17  Height:   Ht Readings from Last 1 Encounters:  05/21/15 5' (1.524 m)    Weight:   Wt Readings from Last 1 Encounters:  05/27/15 301 lb 4.8 oz (136.669 kg)    Ideal Body Weight:     BMI:  Body mass index is 58.84 kg/(m^2).  Estimated Nutritional Needs:   Kcal:  1500-1700  Protein:  >/= 60g  Fluid:  2L/day  EDUCATION NEEDS:   No education needs identified at this time  Satira Anis. Sharika Mosquera, MS, RD LDN After Hours/Weekend Pager [phone]

## 2015-05-28 MED ORDER — PANTOPRAZOLE SODIUM 40 MG IV SOLR
40.0000 mg | INTRAVENOUS | Status: DC
  Administered 2015-05-28 – 2015-05-29 (×2): 40 mg via INTRAVENOUS
  Filled 2015-05-28 (×2): qty 40

## 2015-05-28 NOTE — Progress Notes (Signed)
5 Days Post-Op  Subjective: No complaints. She took a little more po overnight. She states she has to get 2 shakes in today to be able to go home  Objective: Vital signs in last 24 hours: Temp:  [98.3 F (36.8 C)-98.6 F (37 C)] 98.6 F (37 C) (10/21 2150) Pulse Rate:  [61-89] 61 (10/21 2150) Resp:  [16-18] 16 (10/21 2150) BP: (118-123)/(51-72) 118/51 mmHg (10/21 2150) SpO2:  [98 %-99 %] 98 % (10/21 2150) Last BM Date: 05/26/15  Intake/Output from previous day: 10/21 0701 - 10/22 0700 In: 689.9 [P.O.:100; I.V.:589.9] Out: 1550 [Urine:1550] Intake/Output this shift: Total I/O In: 586.9 [I.V.:586.9] Out: 1200 [Urine:1200]  Resp: clear to auscultation bilaterally Cardio: regular rate and rhythm GI: soft, nontender  Lab Results:   Recent Labs  05/26/15 0450  WBC 8.6  HGB 10.9*  HCT 34.7*  PLT 241   BMET  Recent Labs  05/26/15 0450  NA 138  K 3.8  CL 103  CO2 28  GLUCOSE 81  BUN <5*  CREATININE 0.37*  CALCIUM 8.7*   PT/INR No results for input(s): LABPROT, INR in the last 72 hours. ABG No results for input(s): PHART, HCO3 in the last 72 hours.  Invalid input(s): PCO2, PO2  Studies/Results: No results found.  Anti-infectives: Anti-infectives    None      Assessment/Plan: s/p Procedure(s): ESOPHAGOGASTRODUODENOSCOPY (EGD) WITH PROPOFOL (N/A) encourage po's and monitor  LOS: 7 days    TOTH III,PAUL S 05/28/2015

## 2015-05-29 NOTE — Progress Notes (Signed)
Patient discharged home, all discharge medications and instructions reviewed and questions answered.  Patient declined wheelchair assistance to vehicle states will ambulate.  

## 2015-05-29 NOTE — Progress Notes (Signed)
6 Days Post-Op  Subjective: No complaints. Tolerating shakes well now  Objective: Vital signs in last 24 hours: Temp:  [98.4 F (36.9 C)-99 F (37.2 C)] 98.5 F (36.9 C) (10/23 0653) Pulse Rate:  [63-84] 63 (10/23 0653) Resp:  [14-17] 17 (10/23 0653) BP: (107-155)/(49-78) 107/49 mmHg (10/23 0653) SpO2:  [99 %-100 %] 100 % (10/23 0653) Weight:  [134.038 kg (295 lb 8 oz)] 134.038 kg (295 lb 8 oz) (10/23 0653) Last BM Date: 05/24/15  Intake/Output from previous day: 10/22 0701 - 10/23 0700 In: 1852.7 [P.O.:720; I.V.:1132.7] Out: 1700 [Urine:1700] Intake/Output this shift:    GI: soft, nontender  Lab Results:  No results for input(s): WBC, HGB, HCT, PLT in the last 72 hours. BMET No results for input(s): NA, K, CL, CO2, GLUCOSE, BUN, CREATININE, CALCIUM in the last 72 hours. PT/INR No results for input(s): LABPROT, INR in the last 72 hours. ABG No results for input(s): PHART, HCO3 in the last 72 hours.  Invalid input(s): PCO2, PO2  Studies/Results: No results found.  Anti-infectives: Anti-infectives    None      Assessment/Plan: s/p Procedure(s): ESOPHAGOGASTRODUODENOSCOPY (EGD) WITH PROPOFOL (N/A) Discharge  LOS: 8 days    TOTH III,Lennox Leikam S 05/29/2015

## 2015-05-30 ENCOUNTER — Other Ambulatory Visit: Payer: Self-pay

## 2015-05-30 NOTE — Patient Outreach (Signed)
[place] [facility]) Care Management  05/30/2015  HAYDAN WEDIG Feb 19, 1986 [id number] Transition of Care phone call.  No answer and message left to return call. Plan to attempt contacting again tomorrow. Peter Garter RN, [facility] Care Management Coordinator-Link to [place] Management [phone]

## 2015-05-30 NOTE — Patient Outreach (Signed)
[facility]) Care Management  05/30/2015  DANAIJA ESKRIDGE 1986-06-25 [id number] Transition of care phone call.  Member returned message. States that she has been sleeping all morning and she has not eaten anything yet.  States that she forgets to eat because she is not hungry.  States she still has nausea but it is better and she is able to keep her fluids down. Member discharged from hospital on 05/29/15 with nausea and vomiting/s/p laparoscopic sleeve gastrectomy.  Member no longer on Metformin post surgery. Plan to call in one week to follow up with member [facility] CM Care Plan Problem One        Most Recent Value   Care Plan Problem One  Potential for altered fluid volume deficit related to nausea and vomiting s/p laparoscopic sleeve gastrectomy   Role Documenting the Problem One  Care Management [place] for Problem One  Active   [facility] CM Short Term Goal #1 (0-30 days)  Member will maintain nutrition/hydration and avoid admission to hospital for the next 30 days   [facility] CM Short Term Goal #1 Start Date  05/30/15   Interventions for Short Term Goal #1  Transition of care assessment performed, Instructed member to be sure to drink adequate amounts of fluids and to drink her protein shakes as ordered to avoid geting dehydrated, Instructed to keep appointments with MD and RD, Reinforced s/s to call MD, Instructed to pick up medications today    Peter Garter RN, [facility] Care Management Coordinator-Link to [place] Management [phone]

## 2015-06-06 ENCOUNTER — Observation Stay (HOSPITAL_COMMUNITY)
Admission: EM | Admit: 2015-06-06 | Discharge: 2015-06-08 | Disposition: A | Discharge status: Home / Self Care | Payer: 59 | Attending: General Surgery | Admitting: General Surgery

## 2015-06-06 ENCOUNTER — Encounter (HOSPITAL_COMMUNITY): Payer: Self-pay | Admitting: Emergency Medicine

## 2015-06-06 ENCOUNTER — Other Ambulatory Visit: Payer: Self-pay

## 2015-06-06 DIAGNOSIS — N390 Urinary tract infection, site not specified: Secondary | ICD-10-CM | POA: Diagnosis not present

## 2015-06-06 DIAGNOSIS — E119 Type 2 diabetes mellitus without complications: Secondary | ICD-10-CM | POA: Insufficient documentation

## 2015-06-06 DIAGNOSIS — E441 Mild protein-calorie malnutrition: Secondary | ICD-10-CM | POA: Diagnosis not present

## 2015-06-06 DIAGNOSIS — Z9884 Bariatric surgery status: Secondary | ICD-10-CM | POA: Diagnosis not present

## 2015-06-06 DIAGNOSIS — R112 Nausea with vomiting, unspecified: Secondary | ICD-10-CM | POA: Diagnosis not present

## 2015-06-06 DIAGNOSIS — Z6841 Body Mass Index (BMI) 40.0 and over, adult: Secondary | ICD-10-CM | POA: Diagnosis not present

## 2015-06-06 LAB — COMPREHENSIVE METABOLIC PANEL
ALBUMIN: 3.2 g/dL — AB (ref 3.5–5.0)
ALT: 28 U/L (ref 14–54)
AST: 22 U/L (ref 15–41)
Alkaline Phosphatase: 51 U/L (ref 38–126)
Anion gap: 13 (ref 5–15)
BUN: <5 mg/dL — ABNORMAL LOW (ref 6–20)
CHLORIDE: 102 mmol/L (ref 101–111)
CO2: 26 mmol/L (ref 22–32)
CREATININE: 0.64 mg/dL (ref 0.44–1.00)
Calcium: 9.8 mg/dL (ref 8.9–10.3)
GFR calc non Af Amer: >60 mL/min (ref >60)
GLUCOSE: 93 mg/dL (ref 65–99)
Potassium: 3.5 mmol/L (ref 3.5–5.1)
SODIUM: 141 mmol/L (ref 135–145)
Total Bilirubin: 2.8 mg/dL — ABNORMAL HIGH (ref 0.3–1.2)
Total Protein: 7.9 g/dL (ref 6.5–8.1)

## 2015-06-06 LAB — CBC
HCT: 37.8 % (ref 36.0–46.0)
Hemoglobin: 12.1 g/dL (ref 12.0–15.0)
MCH: 24.9 pg — AB (ref 26.0–34.0)
MCHC: 32 g/dL (ref 30.0–36.0)
MCV: 77.8 fL — AB (ref 78.0–100.0)
PLATELETS: 450 10*3/uL — AB (ref 150–400)
RBC: 4.86 MIL/uL (ref 3.87–5.11)
RDW: 15.3 % (ref 11.5–15.5)
WBC: 14.1 10*3/uL — ABNORMAL HIGH (ref 4.0–10.5)

## 2015-06-06 LAB — I-STAT BETA HCG BLOOD, ED (MC, WL, AP ONLY): I-stat hCG, quantitative: <5 m[IU]/mL (ref <5)

## 2015-06-06 LAB — PREALBUMIN: PREALBUMIN: 9.3 mg/dL — AB (ref 18–38)

## 2015-06-06 LAB — LIPASE, BLOOD: LIPASE: 29 U/L (ref 11–51)

## 2015-06-06 MED ORDER — M.V.I. ADULT IV INJ
Freq: Once | INTRAVENOUS | Status: AC
  Administered 2015-06-06: 20:00:00 via INTRAVENOUS
  Filled 2015-06-06: qty 1000

## 2015-06-06 MED ORDER — POTASSIUM CHLORIDE IN NACL 20-0.9 MEQ/L-% IV SOLN
INTRAVENOUS | Status: DC
  Administered 2015-06-06 – 2015-06-08 (×4): via INTRAVENOUS
  Filled 2015-06-06 (×6): qty 1000

## 2015-06-06 MED ORDER — ACETAMINOPHEN 650 MG RE SUPP: 650.0000 mg | Freq: Four times a day (QID) | RECTAL | Status: DC | PRN

## 2015-06-06 MED ORDER — METOCLOPRAMIDE HCL 5 MG/ML IJ SOLN: 10.0000 mg | Freq: Three times a day (TID) | INTRAMUSCULAR | Status: DC | PRN

## 2015-06-06 MED ORDER — DIPHENHYDRAMINE HCL 25 MG PO CAPS: 25.0000 mg | ORAL_CAPSULE | Freq: Four times a day (QID) | ORAL | Status: DC | PRN

## 2015-06-06 MED ORDER — ONDANSETRON HCL 4 MG/2ML IJ SOLN
4.0000 mg | Freq: Four times a day (QID) | INTRAMUSCULAR | Status: DC | PRN
  Filled 2015-06-06: qty 2

## 2015-06-06 MED ORDER — PROMETHAZINE HCL 25 MG/ML IJ SOLN
12.5000 mg | Freq: Four times a day (QID) | INTRAMUSCULAR | Status: DC | PRN
  Administered 2015-06-06 – 2015-06-07 (×2): 12.5 mg via INTRAVENOUS
  Filled 2015-06-06 (×3): qty 1

## 2015-06-06 MED ORDER — METOCLOPRAMIDE HCL 5 MG/ML IJ SOLN
10.0000 mg | Freq: Three times a day (TID) | INTRAMUSCULAR | Status: DC
  Administered 2015-06-06 – 2015-06-08 (×6): 10 mg via INTRAVENOUS
  Filled 2015-06-06 (×6): qty 2

## 2015-06-06 MED ORDER — DIPHENHYDRAMINE HCL 50 MG/ML IJ SOLN: 25.0000 mg | Freq: Four times a day (QID) | INTRAMUSCULAR | Status: DC | PRN

## 2015-06-06 MED ORDER — ENOXAPARIN SODIUM 40 MG/0.4ML ~~LOC~~ SOLN
40.0000 mg | SUBCUTANEOUS | Status: DC
  Filled 2015-06-06 (×2): qty 0.4

## 2015-06-06 MED ORDER — PANTOPRAZOLE SODIUM 40 MG IV SOLR
40.0000 mg | Freq: Every day | INTRAVENOUS | Status: DC
  Administered 2015-06-06 – 2015-06-07 (×2): 40 mg via INTRAVENOUS
  Filled 2015-06-06 (×2): qty 40

## 2015-06-06 MED ORDER — M.V.I. ADULT IV INJ
Freq: Once | INTRAVENOUS | Status: DC
  Filled 2015-06-06: qty 10

## 2015-06-06 MED ORDER — DOCUSATE SODIUM 100 MG PO CAPS: 100.0000 mg | ORAL_CAPSULE | Freq: Two times a day (BID) | ORAL | Status: DC | PRN

## 2015-06-06 MED ORDER — ACETAMINOPHEN 325 MG PO TABS: 650.0000 mg | ORAL_TABLET | Freq: Four times a day (QID) | ORAL | Status: DC | PRN

## 2015-06-06 NOTE — Patient Outreach (Signed)
[place] [facility]) Care Management  06/06/2015  SIANA PANAMENO 1986-08-01 [id number]  Phone call to follow up on members condition.  No answer and message left. Peter Garter RN, [facility] Care Management Coordinator-Link to [place] Management [phone]

## 2015-06-06 NOTE — H&P (Signed)
Lorraine Chambers is an 29 y.o. female.   Chief Complaint: n/v HPI: This is a 29 year old female who underwent laparoscopic gastric sleeve for morbid obesity 03/29/2015 by me. She did well for 3 weeks. At the 3 week mark, she was doing Abdominal exercises at the gym. Following this, she began having difficulty with nausea and vomiting. This was initially to solids but not liquids. However, it has now progressed to liquids. She has been persistently vomiting without being able to keep any oral intake down. She had a upper GI 10/13 which demonstrated a significant narrowing at the junction of the proximal and middle stomach like a string sign.  She was admitted to [facility] on 10/14 thru 10/23. She underwent an upper endoscopy by Dr Lucia Gaskins on 10/17 which demonstrated a patent sleeve. She gradually was able to tolerate more and more liquids and her nausea resolved and she went home on 10/23. She states this past Tuesday she started to have recurrent nausea and then ultimately difficulty with liquids again. She will vomit within about 20 minutes of drinking liquids. She is able to keep down about 20 oz of water during the day but vomit up the rest. She reports bilious vomiting as well. She denies abd pain/shoulder pain. She has only been drinking water. Protein shakes will cause more nausea. She has had white foamies as well. She called this office this am with these c/o and I directed her here for admission  Past Medical History  Diagnosis Date  . Hydradenitis 02/09/2014  . Iron deficiency anemia, unspecified 02/09/2014  . Pneumonia   . GERD (gastroesophageal reflux disease)   . Headache   . Arthritis     left knee  . Hypertension     Pt reports episode of HTN in 2009--no longer an issue 05/2015  . Thyroid disease   . Hypothyroidism 08/23/2014    resolved since gastric sleeve surgery   . Obesity   . Heart murmur     ECHO last friday10/7/16 see cardiology next month Nov 2016  . Diabetes mellitus  without complication ([place])   . DM (diabetes mellitus), type 2 ([place]) 08/23/2014    last A1C 5.5 % per patient. 05/2015    Past Surgical History  Procedure Laterality Date  . Wisdom tooth extraction    . Laparoscopic gastric sleeve resection N/A 03/29/2015    Procedure: LAPAROSCOPIC GASTRIC SLEEVE RESECTION WITH UPPER ENDO;  Surgeon: Greer Pickerel, MD;  Location: [facility];  Service: General;  Laterality: N/A;  . Upper gi endoscopy  03/29/2015    Procedure: UPPER GI ENDOSCOPY;  Surgeon: Greer Pickerel, MD;  Location: [facility];  Service: General;;  . Esophagogastroduodenoscopy (egd) with propofol N/A 05/23/2015    Procedure: ESOPHAGOGASTRODUODENOSCOPY (EGD) WITH PROPOFOL;  Surgeon: Alphonsa Overall, MD;  Location: [facility];  Service: General;  Laterality: N/A;    Family History  Problem Relation Age of Onset  . Stroke Other   . Heart failure Other   . Heart disease Mother   . Diabetes Mother   . Hypertension Mother   . Pulmonary fibrosis Mother   . Cancer Maternal Uncle     brain ca  . Diabetes Father   . Arthritis Father   . Other Father   . Pulmonary fibrosis Maternal Grandmother   . Heart murmur    . Heart murmur Sister    Social History:  reports that she has never smoked. She has never used smokeless tobacco. She reports that she drinks alcohol. She reports  that she does not use illicit drugs.  Allergies:  Allergies  Allergen Reactions  . Bee Venom Anaphylaxis    Medications Prior to Admission  Medication Sig Dispense Refill  . promethazine (PHENERGAN) 6.25 MG/5ML syrup Take 5-10 mLs (6.25-12.5 mg total) by mouth every 6 (six) hours as needed. For nausea 120 mL 0  . pantoprazole (PROTONIX) 40 MG tablet Take 1 tablet (40 mg total) by mouth daily. 30 tablet 0  . promethazine (PHENERGAN) 25 MG suppository Place 1 suppository (25 mg total) rectally every 6 (six) hours as needed for nausea or vomiting. 12 suppository 2  . protein supplement shake (PREMIER PROTEIN) LIQD Take 325 mLs (11  oz total) by mouth 4 (four) times daily -  with meals and at bedtime.  0    Results for orders placed or performed during the hospital encounter of 06/06/15 (from the past 48 hour(s))  Lipase, blood     Status: None   Collection Time: 06/06/15  2:11 PM  Result Value Ref Range   Lipase 29 11 - 51 U/L    Comment: Please note change in reference range.  Comprehensive metabolic panel     Status: Abnormal   Collection Time: 06/06/15  2:11 PM  Result Value Ref Range   Sodium 141 135 - 145 mmol/L   Potassium 3.5 3.5 - 5.1 mmol/L   Chloride 102 101 - 111 mmol/L   CO2 26 22 - 32 mmol/L   Glucose, Bld 93 65 - 99 mg/dL   BUN <5 (L) 6 - 20 mg/dL   Creatinine, Ser 0.64 0.44 - 1.00 mg/dL   Calcium 9.8 8.9 - 10.3 mg/dL   Total Protein 7.9 6.5 - 8.1 g/dL   Albumin 3.2 (L) 3.5 - 5.0 g/dL   AST 22 15 - 41 U/L   ALT 28 14 - 54 U/L   Alkaline Phosphatase 51 38 - 126 U/L   Total Bilirubin 2.8 (H) 0.3 - 1.2 mg/dL   GFR calc non Af Amer >60 >60 mL/min   GFR calc Af Amer >60 >60 mL/min    Comment: (NOTE) The eGFR has been calculated using the CKD EPI equation. This calculation has not been validated in all clinical situations. eGFR's persistently <60 mL/min signify possible Chronic Kidney Disease.    Anion gap 13 5 - 15  CBC     Status: Abnormal   Collection Time: 06/06/15  2:11 PM  Result Value Ref Range   WBC 14.1 (H) 4.0 - 10.5 K/uL   RBC 4.86 3.87 - 5.11 MIL/uL   Hemoglobin 12.1 12.0 - 15.0 g/dL   HCT 37.8 36.0 - 46.0 %   MCV 77.8 (L) 78.0 - 100.0 fL   MCH 24.9 (L) 26.0 - 34.0 pg   MCHC 32.0 30.0 - 36.0 g/dL   RDW 15.3 11.5 - 15.5 %   Platelets 450 (H) 150 - 400 K/uL  I-Stat beta hCG blood, ED ([facility], [facility], [facility] only)     Status: None   Collection Time: 06/06/15  2:11 PM  Result Value Ref Range   I-stat hCG, quantitative <5.0 <5 mIU/mL   Comment 3            Comment:   GEST. AGE      CONC.  (mIU/mL)   <=1 WEEK        5 - 50     2 WEEKS       50 - 500     3 WEEKS  100 - 10,000     4  WEEKS     1,000 - 30,000        FEMALE AND NON-PREGNANT FEMALE:     LESS THAN 5 mIU/mL    No results found.  Review of Systems  Constitutional: Negative for weight loss.  HENT: Negative for nosebleeds.   Eyes: Negative for blurred vision.  Respiratory: Negative for shortness of breath.   Cardiovascular: Negative for chest pain, palpitations, orthopnea and PND.       Denies DOE  Gastrointestinal: Positive for nausea and vomiting. Negative for abdominal pain, diarrhea and blood in stool.  Genitourinary: Negative for dysuria and hematuria.  Musculoskeletal: Negative.   Skin: Negative for itching and rash.  Neurological: Negative for dizziness, focal weakness, seizures, loss of consciousness and headaches.  Endo/Heme/Allergies: Does not bruise/bleed easily.  Psychiatric/Behavioral: The patient is not nervous/anxious.     Blood pressure 137/86, pulse 90, temperature 98.4 F (36.9 C), temperature source Axillary, resp. rate 16, height 5' (1.524 m), weight 127.914 kg (282 lb), last menstrual period 06/06/2015, SpO2 100 %. Physical Exam  Vitals reviewed. Constitutional: She is oriented to person, place, and time. She appears well-developed and well-nourished. No distress.  Obese, not ill appearing, nontoxic  HENT:  Head: Normocephalic and atraumatic.  Right Ear: External ear normal.  Left Ear: External ear normal.  Eyes: Conjunctivae are normal. No scleral icterus.  Neck: Normal range of motion. Neck supple. No tracheal deviation present. No thyromegaly present.  Cardiovascular: Normal rate and normal heart sounds.   Respiratory: Effort normal and breath sounds normal. No stridor. No respiratory distress. She has no wheezes.  GI: Soft. She exhibits no distension. There is no tenderness. There is no rebound.  Musculoskeletal: She exhibits no edema or tenderness.  Lymphadenopathy:    She has no cervical adenopathy.  Neurological: She is alert and oriented to person, place, and time. She  exhibits normal muscle tone.  Skin: Skin is warm and dry. No rash noted. She is not diaphoretic. No erythema. No pallor.  Psychiatric: She has a normal mood and affect. Her behavior is normal. Judgment and thought content normal.     Assessment/Plan Nausea/vomiting S/p lap sleeve gastrectomy 03/2015  Admit ivf Check nutritional labs IV vitamins Chemical vte prophylaxis  Leighton Ruff. Redmond Pulling, MD, FACS General, Bariatric, & Minimally Invasive Surgery [facility], [place]   [facility] M 06/06/2015, 6:28 PM

## 2015-06-06 NOTE — ED Notes (Signed)
Pt from home for eval of nausea and emesis that started today, pt states gastric sleeve done on 8/24 and was hospitalized after due to a stricture. Pt states woke up this morning with emesis and told by GI pcp to come and have dr. Redmond Pulling see pt for possible admission.

## 2015-06-06 NOTE — ED Notes (Signed)
Dr. Redmond Pulling called to advise that the patient was going to be direct admitted.   Once patient receives bed, call floor and advise what has been done, then get patient upstairs.

## 2015-06-06 NOTE — Progress Notes (Signed)
Pt arrived to unit with husband at [facility]. Pt alert and oriented and ambulating independently. Pt oriented to the unit and surroundings. Pt complains of nausea and vomited up some of the ice chips she had eaten earlier. Pt does not complain of any other pain.

## 2015-06-07 ENCOUNTER — Ambulatory Visit: Payer: 59 | Admitting: Dietician

## 2015-06-07 LAB — URINALYSIS, ROUTINE W REFLEX MICROSCOPIC
Glucose, UA: NEGATIVE mg/dL
Nitrite: POSITIVE — AB
PH: 6.5 (ref 5.0–8.0)
Protein, ur: 100 mg/dL — AB
Specific Gravity, Urine: 1.022 (ref 1.005–1.030)
UROBILINOGEN UA: 2 mg/dL — AB (ref 0.0–1.0)

## 2015-06-07 LAB — BASIC METABOLIC PANEL
Anion gap: 12 (ref 5–15)
CALCIUM: 8.8 mg/dL — AB (ref 8.9–10.3)
CHLORIDE: 100 mmol/L — AB (ref 101–111)
CO2: 23 mmol/L (ref 22–32)
CREATININE: 0.49 mg/dL (ref 0.44–1.00)
Glucose, Bld: 74 mg/dL (ref 65–99)
Potassium: 3.6 mmol/L (ref 3.5–5.1)
SODIUM: 135 mmol/L (ref 135–145)

## 2015-06-07 LAB — CBC
HCT: 33.7 % — ABNORMAL LOW (ref 36.0–46.0)
Hemoglobin: 10.8 g/dL — ABNORMAL LOW (ref 12.0–15.0)
MCH: 24.8 pg — ABNORMAL LOW (ref 26.0–34.0)
MCHC: 32 g/dL (ref 30.0–36.0)
MCV: 77.5 fL — AB (ref 78.0–100.0)
PLATELETS: 302 10*3/uL (ref 150–400)
RBC: 4.35 MIL/uL (ref 3.87–5.11)
RDW: 15.1 % (ref 11.5–15.5)
WBC: 12.1 10*3/uL — AB (ref 4.0–10.5)

## 2015-06-07 LAB — URINE MICROSCOPIC-ADD ON

## 2015-06-07 MED ORDER — PREMIER PROTEIN SHAKE
8.0000 [oz_av] | Freq: Four times a day (QID) | ORAL | Status: DC
  Administered 2015-06-07 – 2015-06-08 (×4): 8 [oz_av] via ORAL
  Filled 2015-06-07 (×6): qty 325.31

## 2015-06-07 MED ORDER — ADULT MULTIVITAMIN LIQUID CH
5.0000 mL | Freq: Two times a day (BID) | ORAL | Status: DC
Start: 2015-06-07 — End: 2015-06-08
  Filled 2015-06-07 (×5): qty 5

## 2015-06-07 MED ORDER — PROMETHAZINE HCL 25 MG/ML IJ SOLN: 12.5000 mg | Freq: Four times a day (QID) | INTRAMUSCULAR | Status: DC | PRN

## 2015-06-07 MED ORDER — CHLORHEXIDINE GLUCONATE 0.12 % MT SOLN
15.0000 mL | Freq: Four times a day (QID) | OROMUCOSAL | Status: DC
  Administered 2015-06-07 – 2015-06-08 (×6): 15 mL via OROMUCOSAL
  Filled 2015-06-07 (×6): qty 15

## 2015-06-07 MED ORDER — THIAMINE HCL 100 MG/ML IJ SOLN
100.0000 mg | Freq: Every day | INTRAMUSCULAR | Status: DC
  Administered 2015-06-07 – 2015-06-08 (×2): 100 mg via INTRAVENOUS
  Filled 2015-06-07 (×2): qty 2

## 2015-06-07 NOTE — Progress Notes (Signed)
Initial Nutrition Assessment  DOCUMENTATION CODES:   Morbid obesity  INTERVENTION:   -Calorie Count in progress -Continue Premier Protein QID -Adult Chewable MVI BID -Consider 1500 mg calcium citrate daily  NUTRITION DIAGNOSIS:   Inadequate oral intake related to poor appetite, nausea as evidenced by meal completion < 25%.  GOAL:   Patient will meet greater than or equal to 90% of their needs  MONITOR:   PO intake, Supplement acceptance, Diet advancement, Labs, Weight trends, Skin, I & O's  REASON FOR ASSESSMENT:   Consult Calorie Count  ASSESSMENT:   This is a 29 year old female who underwent laparoscopic gastric sleeve for morbid obesity 03/29/2015 by me. She did well for 3 weeks. At the 3 week mark, she was doing Abdominal exercises at the gym. Following this, she began having difficulty with nausea and vomiting. This was initially to solids but not liquids. However, it has now progressed to liquids. She has been persistently vomiting without being able to keep any oral intake down. She had a upper GI 10/13 which demonstrated a significant narrowing at the junction of the proximal and middle stomach like a string sign. She was admitted to [facility] on 10/14 thru 10/23. She underwent an upper endoscopy by Dr Lucia Gaskins on 10/17 which demonstrated a patent sleeve. She gradually was able to tolerate more and more liquids and her nausea resolved and she went home on 10/23. She states this past Tuesday she started to have recurrent nausea and then ultimately difficulty with liquids again. She will vomit within about 20 minutes of drinking liquids. She is able to keep down about 20 oz of water during the day but vomit up the rest. She reports bilious vomiting as well. She denies abd pain/shoulder pain. She has only been drinking water. Protein shakes will cause more nausea. She has had white foamies as well. She called this office this am with these c/o and I directed her here for  admission  Pt directly admitted for nausea and vomiting.   Spoke with pt at bedside. She reports she was s/p sleeve gastrectomy on 03/30/15 and has had a very poor appetite since surgery. She reports "I have to force myself to eat now".   She reports she has been having nausea and vomiting since 06/06/15 with po intake. Meal intake has been very limited; pt reveals that she has been consuming mainly cranberry juice since surgery in attempt to keep her blood sugar up. She reveals that she consumed 360 oz of water and cranberry juice overnight, which she tolerated well.   Pt reports she did not eat her breakfast tray this morning, due to fear of vomiting. She voiced concern about being advanced to full liquids, due to previous poor tolerance of foods (especially cream soups and chicken broth). She has been unable to tolerate supplements, due to nausea and vomiting. She shares that she was taking EAS [employer] supplement prior to surgery, but switched to [place] after surgery, so she could consume less volume. Offered other bariatric supplements, however, pt politely refused, stating she dislikes the texture of [place] and would like to remain on [place], as she is familiar with it.   Pt expressed concern over her nutritional status. She reports pre-op wt of around 350-355#. She is fearful of losing muscle and fat too quickly. Nutrition-Focused physical exam completed. Findings are no fat depletion, no muscle depletion, and no edema. She has been unable to participate in her work-out program at [employer] due to  post-op complications.  Discussed importance of good PO intake, particularly protein sources as priority, to preserve lean body mass. Encouraged pt to consume protein shakes.   Per GI notes, considering use of TPN if pt unable to consume adequate PO intake.   Labs reviewed.   Diet Order:  Diet bariatric full liquid Room service appropriate?: Yes; Fluid consistency::  Thin  Skin:  Reviewed, no issues  Last BM:  05/30/15  Height:   Ht Readings from Last 1 Encounters:  06/06/15 5' (1.524 m)    Weight:   Wt Readings from Last 1 Encounters:  06/06/15 282 lb (127.914 kg)    Ideal Body Weight:  45.5 kg  BMI:  Body mass index is 55.07 kg/(m^2).  Estimated Nutritional Needs:   Kcal:  1500-1700  Protein:  >/= 60 grams  Fluid:  2 L  EDUCATION NEEDS:   Education needs addressed  Betsaida [place] A. Jimmye Norman, RD, LDN, CDE Pager: [phone] After hours Pager: [phone]

## 2015-06-07 NOTE — Progress Notes (Signed)
  Subjective: Reports nausea this am. But reports she took in 360 oz of water/cranberrry juice overnight!! No abd pain  Objective: Vital signs in last 24 hours: Temp:  [98.4 F (36.9 C)-99.1 F (37.3 C)] 98.9 F (37.2 C) (11/01 0502) Pulse Rate:  [70-90] 85 (11/01 0502) Resp:  [16-17] 17 (11/01 0502) BP: (122-140)/(62-93) 129/79 mmHg (11/01 0502) SpO2:  [94 %-100 %] 100 % (11/01 0502) Weight:  [127.914 kg (282 lb)] 127.914 kg (282 lb) (10/31 1344) Last BM Date: 05/30/15  Intake/Output from previous day: 10/31 0701 - 11/01 0700 In: 1744.5 [P.O.:360; I.V.:1384.5] Out: 350 [Urine:350] Intake/Output this shift: Total I/O In: 26.7 [I.V.:26.7] Out: -   Asleep, resting comfortably Symmetric chest rise Soft, nt, nd No edema  Lab Results:   Recent Labs  06/06/15 1411 06/07/15 0458  WBC 14.1* 12.1*  HGB 12.1 10.8*  HCT 37.8 33.7*  PLT 450* 302   BMET  Recent Labs  06/06/15 1411 06/07/15 0458  NA 141 135  K 3.5 3.6  CL 102 100*  CO2 26 23  GLUCOSE 93 74  BUN <5* <5*  CREATININE 0.64 0.49  CALCIUM 9.8 8.8*   Hepatic Function Latest Ref Rng 06/06/2015 05/24/2015 05/20/2015  Total Protein 6.5 - 8.1 g/dL 7.9 5.7(L) 7.9  Albumin 3.5 - 5.0 g/dL 3.2(L) 2.6(L) 4.0  AST 15 - 41 U/L 22 30 54(H)  ALT 14 - 54 U/L 28 43 63(H)  Alk Phosphatase 38 - 126 U/L 51 28(L) 40  Total Bilirubin 0.3 - 1.2 mg/dL 2.8(H) 1.3(H) 2.6(H)     PT/INR No results for input(s): LABPROT, INR in the last 72 hours. ABG No results for input(s): PHART, HCO3 in the last 72 hours.  Invalid input(s): PCO2, PO2  Studies/Results: No results found.  Anti-infectives: Anti-infectives    None      Assessment/Plan: Nausea/vomiting S/p lap sleeve gastrectomy 03/2015 Mild protein calorie malnutrition  Antiemetics as needed. Very pleased with liquid intake 360 oz! Without vomiting Will adv to bariatric full liquids Add premier protein shake Add daily thiamine.  Ambulate If cont to have  issues with po intake, will plan PICC/TPN/prn IVF at home Discussed/reviewed her recent EGD with Dr Watt Climes. Sleeve doesn't appear strictured on endoscopically.  Will see how she does over next day or so  Leighton Ruff. Redmond Pulling, MD, FACS General, Bariatric, & Minimally Invasive Surgery [facility], [place]      [facility] Jerilynn Mages 06/07/2015

## 2015-06-08 LAB — BASIC METABOLIC PANEL
ANION GAP: 12 (ref 5–15)
CO2: 23 mmol/L (ref 22–32)
Calcium: 9.2 mg/dL (ref 8.9–10.3)
Chloride: 105 mmol/L (ref 101–111)
Creatinine, Ser: 0.43 mg/dL — ABNORMAL LOW (ref 0.44–1.00)
Glucose, Bld: 78 mg/dL (ref 65–99)
POTASSIUM: 3.9 mmol/L (ref 3.5–5.1)
SODIUM: 140 mmol/L (ref 135–145)

## 2015-06-08 LAB — CBC
HEMATOCRIT: 33.6 % — AB (ref 36.0–46.0)
HEMOGLOBIN: 10.4 g/dL — AB (ref 12.0–15.0)
MCH: 23.9 pg — ABNORMAL LOW (ref 26.0–34.0)
MCHC: 31 g/dL (ref 30.0–36.0)
MCV: 77.2 fL — ABNORMAL LOW (ref 78.0–100.0)
Platelets: 421 10*3/uL — ABNORMAL HIGH (ref 150–400)
RBC: 4.35 MIL/uL (ref 3.87–5.11)
RDW: 15 % (ref 11.5–15.5)
WBC: 12.4 10*3/uL — AB (ref 4.0–10.5)

## 2015-06-08 MED ORDER — NITROFURANTOIN MACROCRYSTAL 100 MG PO CAPS
100.0000 mg | ORAL_CAPSULE | Freq: Two times a day (BID) | ORAL | Status: DC
  Administered 2015-06-08: 100 mg via ORAL
  Filled 2015-06-08 (×2): qty 1

## 2015-06-08 MED ORDER — METOCLOPRAMIDE HCL 5 MG/5ML PO SOLN: 10.0000 mg | Freq: Three times a day (TID) | ORAL | Status: DC

## 2015-06-08 MED ORDER — NITROFURANTOIN MACROCRYSTAL 100 MG PO CAPS: 100.0000 mg | ORAL_CAPSULE | Freq: Two times a day (BID) | ORAL | Status: DC

## 2015-06-08 MED ORDER — NITROFURANTOIN MONOHYD MACRO 100 MG PO CAPS
100.0000 mg | ORAL_CAPSULE | Freq: Two times a day (BID) | ORAL | Status: DC
  Filled 2015-06-08 (×2): qty 1

## 2015-06-08 NOTE — Progress Notes (Signed)
Calorie Count Note  48 hour calorie count ordered.  Diet: Regular Supplements: Premier Protein Shake QID  Day 1 Breakfast: n/a Lunch: n/a Dinner: n/a Supplements: n/a  Calorie count data incomplete. Per MD note, pt's nausea has resolved. Chart review indicates that pt consumed an entire Premier Protein shake, 8 oz skim milk, and cranberry juice (approximately 315 kcals and 38 grams of protein based upon this report).   MD reports will hold off on TPN for now. Per chart review, likely discharge later today if pt able to tolerate a full Premier Protein Shake at lunch.  Total intake: 315 kcal (21% of minimum estimated needs)  38 protein (63% of minimum estimated needs)  Nutrition Dx: Inadequate oral intake related to poor appetite, nausea as evidenced by meal completion < 25%; progressing  Goal: Patient will meet greater than or equal to 90% of their needs; progressing  Intervention:   -Calorie Count in progress (follow-up with complete 48 hour results on 06/09/15 if pt is still in hospital) -Continue Premier Protein QID -Adult Chewable MVI BID -Consider 1500 mg calcium citrate daily  Smiley Birr A. Jimmye Norman, RD, LDN, CDE Pager: [phone] After hours Pager: [phone]

## 2015-06-08 NOTE — Discharge Instructions (Signed)
STAY on FULL Liquids for now Can have consistency up to yogurt thickness Drink plenty of water. Drink shakes! Take reglan as prescribed.     GASTRIC BYPASS/SLEEVE  Home Care Instructions   These instructions are to help you care for yourself when you go home.  Call: If you have any problems.  Call [phone] and ask for the surgeon on call  If you need immediate assistance come to the ER at [facility]. Tell the ER staff you are a new post-op gastric bypass or gastric sleeve patient  Signs and symptoms to report:  Severe  vomiting or nausea o If you cannot handle clear liquids for longer than 1 day, call your surgeon  Abdominal pain which does not get better after taking your pain medication  Fever greater than 100.4  F and chills  Heart rate over 100 beats a minute  Trouble breathing  Chest pain  Redness,  swelling, drainage, or foul odor at incision (surgical) sites  If your incisions open or pull apart  Swelling or pain in calf (lower leg)  Diarrhea (Loose bowel movements that happen often), frequent watery, uncontrolled bowel movements  Constipation, (no bowel movements for 3 days) if this happens: o Take Milk of Magnesia, 2 tablespoons by mouth, 3 times a day for 2 days if needed o Stop taking Milk of Magnesia once you have had a bowel movement o Call your doctor if constipation continues Or o Take Miralax  (instead of Milk of Magnesia) following the label instructions o Stop taking Miralax once you have had a bowel movement o Call your doctor if constipation continues  Anything you think is abnormal for you   Normal side effects after surgery:  Unable to sleep at night or unable to concentrate  Irritability  Being tearful (crying) or depressed  These are common complaints, possibly related to your anesthesia, stress of surgery, and change in lifestyle, that usually go away a few weeks after surgery. If these feelings continue, call your medical  doctor.  Wound Care: You may have surgical glue, steri-strips, or staples over your incisions after surgery  Surgical glue: Looks like clear film over your incisions and will wear off a little at a time  Steri-strips: Adhesive strips of tape over your incisions. You may notice a yellowish color on skin under the steri-strips. This is used to make the steri-strips stick better. Do not pull the steri-strips off - let them fall off  Staples: Staples may be removed before you leave the hospital o If you go home with staples, call [facility] for an appointment with your surgeons nurse to have staples removed 10 days after surgery, [phone]  Showering: You may shower two (2) days after your surgery unless your surgeon tells you differently o Wash gently around incisions with warm soapy water, rinse well, and gently pat dry o If you have a drain (tube from your incision), you may need someone to hold this while you shower o No tub baths until staples are removed and incisions are healed   Medications:  Medications should be liquid or crushed if larger than the size of a dime  Extended release pills (medication that releases a little bit at a time through the  day) should not be crushed  Depending on the size and number of medications you take, you may need to space (take a few throughout the day)/change the time you take your medications so that you do not over-fill your pouch (smaller  stomach)  Make sure you follow-up with you primary care physician to make medication changes needed during rapid weight loss and life -style changes  If you have diabetes, follow up with your doctor that orders your diabetes medication(s) within one week after surgery and check your blood sugar regularly   Do not drive while taking narcotics (pain medications)   Do not take acetaminophen (Tylenol) and Roxicet or Lortab Elixir at the same time since these pain medications contain  acetaminophen   Diet:  First 2 Weeks You will see the nutritionist about two (2) weeks after your surgery. The nutritionist will increase the types of foods you can eat if you are handling liquids well:  If you have severe vomiting or nausea and cannot handle clear liquids lasting longer than 1 day call your surgeon Protein Shake  Drink at least 2 ounces of shake 5-6 times per day  Each serving of protein shakes (usually 8-12 ounces) should have a minimum of: o 15 grams of protein o And no more than 5 grams of carbohydrate  Goal for protein each day: o Men = 80 grams per day o Women = 60 grams per day     Protein powder may be added to fluids such as non-fat milk or Lactaid milk or Soy milk (limit to 35 grams added protein powder per serving)  Hydration  Slowly increase the amount of water and other clear liquids as tolerated (See Acceptable Fluids)  Slowly increase the amount of protein shake as tolerated  Sip fluids slowly and throughout the day  May use sugar substitutes in small amounts (no more than 6-8 packets per day; i.e. Splenda)  Fluid Goal  The first goal is to drink at least 8 ounces of protein shake/drink per day (or as directed by the nutritionist); some examples of protein shakes are [employer], [employer]R [employer], EAS Edge HP, and Unjury. - See handout from pre-op Bariatric Education Class: o Slowly increase the amount of protein shake you drink as tolerated o You may find it easier to slowly sip shakes throughout the day o It is important to get your proteins in first  Your fluid goal is to drink 64-100 ounces of fluid daily o It may take a few weeks to build up to this   32 oz. (or more) should be clear liquids And  32 oz. (or more) should be full liquids (see below for examples)  Liquids should not contain sugar, caffeine, or carbonation  Clear Liquids:  Water of Sugar-free flavored water (i.e. Fruit HO, Propel)  Decaffeinated coffee or tea  (sugar-free)  Crystal lite, Wylers Lite, Minute Maid Lite  Sugar-free Jell-O  Bouillon or broth  Sugar-free Popsicle:    - Less than 20 calories each; Limit 1 per day  Full Liquids:                   Protein Shakes/Drinks + 2 choices per day of other full liquids  Full liquids must be: o No More Than 12 grams of Carbs per serving o No More Than 3 grams of Fat per serving  Strained low-fat cream soup  Non-Fat milk  Fat-free Lactaid Milk  Sugar-free yogurt (Dannon Lite & Fit, Greek yogurt)    Vitamins and Minerals  Start 1 day after surgery unless otherwise directed by your surgeon  2 Chewable Multivitamin / Multimineral Supplement with iron (i.e. Centrum for Adults)  Vitamin B-12, 350-500 micrograms sub-lingual (place tablet under the tongue) each day  Chewable  Calcium Citrate with Vitamin D-3 (Example: 3 Chewable Calcium  Plus 600 with Vitamin D-3) o Take 500 mg three (3) times a day for a total of 1500 mg each day o Do not take all 3 doses of calcium at one time as it may cause constipation, and you can only absorb 500 mg at a time o Do not mix multivitamins containing iron with calcium supplements;  take 2 hours apart o Do not substitute Tums (calcium carbonate) for your calcium  Menstruating women and those at risk for anemia ( a blood disease that causes weakness) may need extra iron o Talk to your doctor to see if you need more iron  If you need extra iron: Total daily Iron recommendation (including Vitamins) is 50 to 100 mg Iron/day  Do not stop taking or change any vitamins or minerals until you talk to your nutritionist or surgeon  Your nutritionist and/or surgeon must approve all vitamin and mineral supplements   Activity and Exercise: It is important to continue walking at home. Limit your physical activity as instructed by your doctor. During this time, use these guidelines:  Do not lift anything greater than ten  (10) pounds for at least two (2)  weeks  Do not go back to work or drive until Engineer, production says you can  You may have sex when you feel comfortable o It is VERY important for female patients to use a reliable birth control method; fertility often increase after surgery o Do not get pregnant for at least 18 months  Start exercising as soon as your doctor tells you that you can o Make sure your doctor approves any physical activity  Start with a simple walking program  Walk 5-15 minutes each day, 7 days per week  Slowly increase until you are walking 30-45 minutes per day  Consider joining our [place] program. [phone] or email [email or web address]   Special Instructions Things to remember:  Free counseling is available for you and your family through collaboration between [facility] and [place]. Please call [phone] and leave a message  Use your CPAP when sleeping if this applies to you  Consider buying a medical alert bracelet that says you had lap-band surgery     You will likely have your first fill (fluid added to your band) 6 - 8 weeks after surgery  [facility] has a free Bariatric Surgery Support Group that meets monthly, the 3rd Thursday, [place]. You can see classes online at [email or web address]  It is very important to keep all follow up appointments with your surgeon, nutritionist, primary care physician, and behavioral health practitioner o After the first year, please follow up with your bariatric surgeon and nutritionist at least once a year in order to maintain best weight loss results                    [facility]:  [place]: [phone]               Bariatric Nurse Coordinator: [phone]  Gastric Bypass/Sleeve Home Care Instructions  Rev. 09/2012  Reviewed and Endorsed                                                     by [facility] Patient Education Committee, Jan, 2014

## 2015-06-08 NOTE — Progress Notes (Signed)
Patient discharged to home. Discharge instructions was reviewed with patient, and a copy of the AVS was given to patient as well. Patient agreed and verbalized understanding. No further questions at this moment.  Lorraine Chambers n 06/08/2015 2:47 PM

## 2015-06-08 NOTE — Progress Notes (Signed)
  Subjective: Much less nausea, essentially gone. No abd pain. States she drank >600 oz of liquid yesterday. Had 1 whole premier shake, numerous cups of water, carton of milk, cranberry juice. No vomiting.   Objective: Vital signs in last 24 hours: Temp:  [98.2 F (36.8 C)-98.5 F (36.9 C)] 98.2 F (36.8 C) (11/02 0439) Pulse Rate:  [81-97] 88 (11/02 0439) Resp:  [16-18] 18 (11/02 0439) BP: (109-130)/(69-91) 130/86 mmHg (11/02 0439) SpO2:  [96 %-100 %] 100 % (11/02 0439) Last BM Date: 05/30/15  Intake/Output from previous day: 11/01 0701 - 11/02 0700 In: 3557.2 [P.O.:460; I.V.:2536.2] Out: 1400 [Urine:1400] Intake/Output this shift:    Alert, nad Soft, obese, nt No edema  Lab Results:   Recent Labs  06/07/15 0458 06/08/15 0431  WBC 12.1* 12.4*  HGB 10.8* 10.4*  HCT 33.7* 33.6*  PLT 302 421*   BMET  Recent Labs  06/07/15 0458 06/08/15 0431  NA 135 140  K 3.6 3.9  CL 100* 105  CO2 23 23  GLUCOSE 74 78  BUN <5* <5*  CREATININE 0.49 0.43*  CALCIUM 8.8* 9.2   PT/INR No results for input(s): LABPROT, INR in the last 72 hours. ABG No results for input(s): PHART, HCO3 in the last 72 hours.  Invalid input(s): PCO2, PO2  Studies/Results: No results found.  Anti-infectives: Anti-infectives    None      Assessment/Plan: Nausea/vomiting S/p sleeve gastrectomy 03/2015  Appears her po is better/improving. If she is able to tolerate that much liquid without vomiting I doubt she has a stricture. Will hold off on picc/tpn at this time point.  May have a motility issue in her stomach.  If tolerates a whole shake this am, will dc around lunch.  Will send out on reglan Advised her that i want her to stay on full liquids for time being Will start macrobid for simple uti  Leighton Ruff. Redmond Pulling, MD, FACS General, Bariatric, & Minimally Invasive Surgery [facility], [place]      [facility] Jerilynn Mages 06/08/2015

## 2015-06-09 ENCOUNTER — Other Ambulatory Visit: Payer: Self-pay

## 2015-06-09 NOTE — Patient Outreach (Signed)
[facility]) Care Management  06/09/2015  [place]ance Wyre Campbell-Prince 12/05/1985 [phone] Transition of care follow up call. Member states that she is feeling better but still has some nausea.  States she has not picked up the Reglan that was prescribed yesterday but plans to pick up on her way to work later today.  States she has been able to drink about half of a protein shake today.   Instructed to call to schedule follow up appointment with RD. Reinforced to follow full liquid diet and to drink enough fluids while at work [place] Problem One        Most Recent Value   Care Plan Problem One  Potential for altered fluid volume deficit related to nausea and vomiting s/p laparoscopic sleeve gastrectomy   Role Documenting the Problem One  Care Management Coordinator   Care Plan for Problem One  Active   THN CM Short Term Goal #1 (0-30 days)  Member will maintain nutrition/hydration and avoid admission to hospital for the next 30 days   THN CM Short Term Goal #1 Start Date  05/30/15   Interventions for Short Term Goal #1  Reinforced to  member to be sure to drink adequate amounts of fluids and to drink her protein shakes as ordered to avoid geting dehydrated, Reinforcefto keep appointments with  RD, Reinforced s/s to call MD, Instructed to pick up medications today     Plan to call next week to follow up Marie, [facility] Care Management Coordinator-Link to [place] [facility] [phone]

## 2015-06-10 NOTE — Discharge Summary (Signed)
Physician Discharge Summary  TREASE BREMNER QQI:[id number] DOB: Jan 04, 1986 DOA: 06/06/2015  PCP: Gerrit Heck, MD  Admit date: 06/06/2015 Discharge date: 06/08/2015  Recommendations for Outpatient Follow-up:   Follow-up Information    Follow up with [name], MD. Schedule an appointment as soon as possible for a visit in 2 weeks.   Specialty:  General Surgery   Why:  For wound re-check   Contact information:   [place] [address] [phone]      Discharge Diagnoses:  1. Nausea/vomiting 2. H/o laparoscopic sleeve gastrectomy 03/2015 3. UTI 4. Mild protein calorie malnutrition  Discharge Condition: good Disposition: home  Diet recommendation: bariatric full liquids  Filed Weights   06/06/15 1344  Weight: 127.914 kg (282 lb)    Hospital Course:  29 year old female status post laparoscopic sleeve gastrectomy in August 2016 who is had issues with by mouth tolerance for the past month or so. She had a previous upper GI in mid October which was concerning for a mid sleeve stricture. She suddenly underwent upper endoscopy which demonstrated a patent sleeve. There is no evidence of stricture. She was discharged from [facility]. She had been doing well until a few days ago. She contact the office saying that she was having ongoing nausea, vomiting, and inability to keep liquids down. I recommended admission to the hospital for IV fluids. She was started on IV fluids. A urinalysis was also checked on admission which came back positive. She was started on scheduled IV Reglan as well as antibiotics. Her ability to take liquids gradually improved. She was able to consistently take more liquids. On day of discharge she was able to tolerate 2 protein shakes, water, and a serving of milk. She did this without any nausea or vomiting. I felt she was stable for discharge. I recommended that she stay on full liquids for the time  being   Discharge Instructions     Medication List    STOP taking these medications        protein supplement shake Liqd  Commonly known as:  PREMIER PROTEIN      TAKE these medications        metoCLOPramide 5 MG/5ML solution  Commonly known as:  REGLAN  Take 10 mLs (10 mg total) by mouth 4 (four) times daily -  before meals and at bedtime.     nitrofurantoin 100 MG capsule  Commonly known as:  MACRODANTIN  Take 1 capsule (100 mg total) by mouth every 12 (twelve) hours.     pantoprazole 40 MG tablet  Commonly known as:  PROTONIX  Take 1 tablet (40 mg total) by mouth daily.     promethazine 6.25 MG/5ML syrup  Commonly known as:  PHENERGAN  Take 5-10 mLs (6.25-12.5 mg total) by mouth every 6 (six) hours as needed. For nausea           Follow-up Information    Follow up with [name], MD. Schedule an appointment as soon as possible for a visit in 2 weeks.   Specialty:  General Surgery   Why:  For wound re-check   Contact information:   [place] [address] [phone]        The results of significant diagnostics from this hospitalization (including imaging, microbiology, ancillary and laboratory) are listed below for reference.    Significant Diagnostic Studies: Dg Abd Acute W/chest  05/21/2015  CLINICAL DATA:  Patient is 8 weeks postoperative from  gastric sleeve surgery. Now complains of nausea and vomiting for 3 weeks, worsening today. Upper GI examination demonstrated gastric narrowing. EXAM: DG ABDOMEN ACUTE W/ 1V CHEST COMPARISON:  Upper GI 05/20/2015. CT abdomen and pelvis 04/24/2015. Upper GI 03/30/2015. Chest 03/22/2015. FINDINGS: Normal heart size and pulmonary vascularity. No focal airspace disease or consolidation in the lungs. No blunting of costophrenic angles. No pneumothorax. Mediastinal contours appear intact. Scattered gas and stool in the colon. No small or large bowel distention. No free intra-abdominal air. No  abnormal air-fluid levels. No radiopaque stones. Visualized bones appear intact. Residual contrast material in the decompressed colon. Surgical clips in the left upper quadrant. IMPRESSION: No evidence of active pulmonary disease. Nonobstructive bowel gas pattern. Postoperative changes in the left upper quadrant. Electronically Signed   By: Lucienne Capers M.D.   On: 05/21/2015 00:09   Dg Ugi  W/kub  05/20/2015  CLINICAL DATA:  Nausea and vomiting, vomiting of unspecified type. Symptoms for 4 weeks. Status post laparoscopic sleeve gastrectomy August 2016. EXAM: UPPER GI SERIES WITH KUB TECHNIQUE: After obtaining a scout radiograph a routine upper GI series was performed using thin barium. FLUOROSCOPY TIME:  Radiation Exposure Index (as provided by the fluoroscopic device): 261 dGy cm2 If the device does not provide the exposure index: Fluoroscopy Time (in minutes and seconds):  1 minutes 36 seconds. Number of Acquired Images:  11 COMPARISON:  CT abdomen pelvis 04/24/2015 and postoperative upper GI a 03/30/2015. FINDINGS: Scout view of the abdomen shows a normal bowel gas pattern. Patient is status post laparoscopic sleeve gastrectomy. Patient was able to drink a minimal amount of thin barium due to nausea and vomiting. However, there is dilatation of the proximal third of the stomach with a persistent area of narrowing at the junction of the proximal and middle thirds of the stomach. Contrast does pass through into the distal stomach. IMPRESSION: Dilated proximal stomach with a focal area of narrowing at the junction of the proximal and middle thirds of the stomach, status post gastric sleeve procedure. Electronically Signed   By: Lorin Picket M.D.   On: 05/20/2015 08:51    Microbiology: No results found for this or any previous visit (from the past 240 hour(s)).   Labs: Basic Metabolic Panel:  Recent Labs Lab 06/06/15 1411 06/07/15 0458 06/08/15 0431  NA 141 135 140  K 3.5 3.6 3.9  CL 102  100* 105  CO2 26 23 23   GLUCOSE 93 74 78  BUN <5* <5* <5*  CREATININE 0.64 0.49 0.43*  CALCIUM 9.8 8.8* 9.2   Liver Function Tests:  Recent Labs Lab 06/06/15 1411  AST 22  ALT 28  ALKPHOS 51  BILITOT 2.8*  PROT 7.9  ALBUMIN 3.2*    Recent Labs Lab 06/06/15 1411  LIPASE 29   No results for input(s): AMMONIA in the last 168 hours. CBC:  Recent Labs Lab 06/06/15 1411 06/07/15 0458 06/08/15 0431  WBC 14.1* 12.1* 12.4*  HGB 12.1 10.8* 10.4*  HCT 37.8 33.7* 33.6*  MCV 77.8* 77.5* 77.2*  PLT 450* 302 421*   Cardiac Enzymes: No results for input(s): CKTOTAL, CKMB, CKMBINDEX, TROPONINI in the last 168 hours. BNP: BNP (last 3 results) No results for input(s): BNP in the last 8760 hours.  ProBNP (last 3 results) No results for input(s): PROBNP in the last 8760 hours.  CBG: No results for input(s): GLUCAP in the last 168 hours.  Active Problems:   Nausea & vomiting   Time coordinating discharge: 15 minutes  Signed:  [name], MD [facility], [place] 06/10/2015, 3:50 PM

## 2015-06-14 ENCOUNTER — Encounter: Payer: 59 | Attending: General Surgery | Admitting: Dietician

## 2015-06-14 ENCOUNTER — Encounter: Payer: Self-pay | Admitting: Dietician

## 2015-06-14 VITALS — Ht 61.0 in | Wt 283.5 lb

## 2015-06-14 DIAGNOSIS — Z713 Dietary counseling and surveillance: Secondary | ICD-10-CM | POA: Diagnosis not present

## 2015-06-14 DIAGNOSIS — Z6841 Body Mass Index (BMI) 40.0 and over, adult: Secondary | ICD-10-CM | POA: Diagnosis not present

## 2015-06-14 DIAGNOSIS — Z01818 Encounter for other preprocedural examination: Secondary | ICD-10-CM | POA: Insufficient documentation

## 2015-06-14 NOTE — Progress Notes (Signed)
Follow-up visit:  11 Weeks Post-Operative Sleeve Gastrectomy Surgery  Medical Nutrition Therapy:  Appt start time: 8546 end time:  2703.  Primary concerns today: Post-operative Bariatric Surgery Nutrition Management. Returns with a 40 lbs weight loss. Had 2 hospital admissions for nausea/vomiting/possible stricture since Post Op class. Had EGD which did not show a stricture. Feeling better now and having mostly liquids for now. Trying to get back to meeting protein and fluid goals. Tried a piece of chicken on Sunday which felt weird but did not make her vomit. Started drinking cranberry juice since her blood sugar was going low (in 60's mg/dl). Had been taking Metformin and no longer than that.   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 Drinks more while working. Doesn't feel hungry or thirsty and has to "force" herself to drink.  Surgery date: 03/29/2015 Surgery type: Sleeve Gastrectomy [facility] weight at [facility]: 352.5 lbs on 11/04/14 Weight today: 283.5 lbs  Weight change: 40 lbs Total weight loss: 69 lbs  TANITA  BODY COMP RESULTS  02/14/15 04/12/15 06/14/15   BMI (kg/m^2) 66.3 61.1 53.6   Fat Mass (lbs) 188 185.5 141.0   Fat Free Mass (lbs) 163 138.0 142.5   Total Body Water (lbs) 119.5 101.0 104.5    Preferred Learning Style:   No preference indicated   Learning Readiness:   Ready  24-hr recall: B (AM): none Snk (AM): none L (PM): sips Premier protein or Atkins shake over 12 hours (15-30 g) Snk (PM): none D (PM): none Snk (PM): 30 oz ice water, 8 oz fat free milk, 8-10  oz cranberry juice, 11 oz protein shake  Fluid intake: 57 oz fluid (starting this week) Estimated total protein intake: 23-38 g with milk   Medications: see list Supplementation: not taking, was taking them before issues with nausea/vomiting  CBG monitoring: as needed Average CBG per patient: 70-80 mg/dl Last patient reported A1c: 5.4% in September   Using straws:  No Drinking while eating: No Hair loss: No Carbonated beverages: had some diet ginger ale but made her vomiting N/V/D/C: extensive nausea and vomiting that is getting better Dumping syndrome: Had one time with juice  Recent physical activity:  Walk on job, [place] and planning to go back to [employer] (no regular schedule yet)  Progress Towards Goal(s):  In progress.  Handouts given during visit include:  Phase 3B High Protein + Non Starchy   Nutritional Diagnosis:  [place]-3.3 Overweight/obesity related to past poor dietary habits and physical inactivity as evidenced by patient w/ recent sleeve gastrectomy surgery following dietary guidelines for continued weight loss.  Intervention:  Nutrition education/advancement.  Goals:  Follow Phase 3B: High Protein + Non-Starchy Vegetables  Eat 3-6 small meals/snacks, every 3-5 hrs  Increase lean protein foods to meet 60g goal  Make sure to get 60 grams of protein in before trying vegetables  Try to have 2 protein shakes per day (ok to mix Atkins and Premier)  Start trying protein foods again as tolerated  Increase fluid intake to 64oz +  Plan to not buy more cranberry juice  Avoid drinking 15 minutes before, during and 30 minutes after eating  Aim for >30 min of physical activity daily  Get back to taking multivitamin (Flinstones complete), calcium citrate, and Vitamin B12)  Look for additional iron and Vitamin D supplements (1000 or more IUs)   Teaching Method Utilized:  Visual Auditory Hands on  Barriers to learning/adherence to  lifestyle change: hx of nausea/vomiting  Demonstrated degree of understanding via:  Teach Back   Monitoring/Evaluation:  Dietary intake, exercise, and body weight. Follow up in 1 months for 4 month post-op visit.

## 2015-06-14 NOTE — Patient Instructions (Signed)
Goals:  Follow Phase 3B: High Protein + Non-Starchy Vegetables  Eat 3-6 small meals/snacks, every 3-5 hrs  Increase lean protein foods to meet 60g goal  Make sure to get 60 grams of protein in before trying vegetables  Try to have 2 protein shakes per day (ok to mix Atkins and Premier)  Start trying protein foods again as tolerated  Increase fluid intake to 64oz +  Plan to not buy more cranberry juice  Avoid drinking 15 minutes before, during and 30 minutes after eating  Aim for >30 min of physical activity daily  Get back to taking multivitamin (Flinstones complete), calcium citrate, and Vitamin B12)  Look for additional iron and Vitamin D supplements (1000 or more IUs)

## 2015-06-15 NOTE — Discharge Summary (Signed)
Physician Discharge Summary  Lorraine Chambers [id number] DOB: 05-20-1986 DOA: 05/20/2015  PCP: Gerrit Heck, MD  Consultation:  Admit date: 05/20/2015 Discharge date: 05/29/2015  Recommendations for Outpatient Follow-up:   Follow-up Information    Follow up with [name], MD In 1 week.   Specialty:  General Surgery   Contact information:   [place] [address] [phone]      Discharge Diagnoses:  1. Nausea and vomiting 2. Hypokalemia 3. Dehydration    Surgical Procedure: EGD---Dr. Lucia Gaskins  Discharge Condition: stable Disposition: home.  Diet recommendation: bariatric fulls   Filed Weights   05/27/15 0615 05/28/15 0628 05/29/15 0653  Weight: 136.669 kg (301 lb 4.8 oz) 135.535 kg (298 lb 12.8 oz) 134.038 kg (295 lb 8 oz)       Hospital Course:  Lorraine Chambers presented to [facility] with persistent abdominal pain and vomiting.  She had a laparoscopic gastric sleeve by Dr. Redmond Pulling 03/29/15.  She was admitted for IV hydration.  She had an UGI which showed a possible stricture.  This was followed by a EGD which was essentially normal.  She clinically improved and there was no objective cause for her symptoms.  On HD#9 she was felt stable for discharge home.    Discharge Instructions  Discharge Instructions    Call MD for:  difficulty breathing, headache or visual disturbances    Complete by:  As directed      Call MD for:  extreme fatigue    Complete by:  As directed      Call MD for:  hives    Complete by:  As directed      Call MD for:  persistant dizziness or light-headedness    Complete by:  As directed      Call MD for:  persistant nausea and vomiting    Complete by:  As directed      Call MD for:  redness, tenderness, or signs of infection (pain, swelling, redness, odor or green/yellow discharge around incision site)    Complete by:  As directed      Call MD for:  severe uncontrolled pain    Complete by:   As directed      Call MD for:  temperature >100.4    Complete by:  As directed      Diet - low sodium heart healthy    Complete by:  As directed      Discharge instructions    Complete by:  As directed   Follow bariatric diet.     Increase activity slowly    Complete by:  As directed      No wound care    Complete by:  As directed             Medication List    STOP taking these medications        oxyCODONE 5 MG/5ML solution  Commonly known as:  ROXICODONE      TAKE these medications        pantoprazole 40 MG tablet  Commonly known as:  PROTONIX  Take 1 tablet (40 mg total) by mouth daily.     promethazine 6.25 MG/5ML syrup  Commonly known as:  PHENERGAN  Take 5-10 mLs (6.25-12.5 mg total) by mouth every 6 (six) hours as needed. For nausea           Follow-up Information    Follow up with [name], MD In 1 week.   Specialty:  General Surgery   Contact information:   [address] [place] [address] [phone]        The results of significant diagnostics from this hospitalization (including imaging, microbiology, ancillary and laboratory) are listed below for reference.    Significant Diagnostic Studies: Dg Abd Acute W/chest  05/21/2015  CLINICAL DATA:  Patient is 8 weeks postoperative from gastric sleeve surgery. Now complains of nausea and vomiting for 3 weeks, worsening today. Upper GI examination demonstrated gastric narrowing. EXAM: DG ABDOMEN ACUTE W/ 1V CHEST COMPARISON:  Upper GI 05/20/2015. CT abdomen and pelvis 04/24/2015. Upper GI 03/30/2015. Chest 03/22/2015. FINDINGS: Normal heart size and pulmonary vascularity. No focal airspace disease or consolidation in the lungs. No blunting of costophrenic angles. No pneumothorax. Mediastinal contours appear intact. Scattered gas and stool in the colon. No small or large bowel distention. No free intra-abdominal air. No abnormal air-fluid levels. No radiopaque stones. Visualized bones  appear intact. Residual contrast material in the decompressed colon. Surgical clips in the left upper quadrant. IMPRESSION: No evidence of active pulmonary disease. Nonobstructive bowel gas pattern. Postoperative changes in the left upper quadrant. Electronically Signed   By: Lucienne Capers M.D.   On: 05/21/2015 00:09   Dg Ugi  W/kub  05/20/2015  CLINICAL DATA:  Nausea and vomiting, vomiting of unspecified type. Symptoms for 4 weeks. Status post laparoscopic sleeve gastrectomy August 2016. EXAM: UPPER GI SERIES WITH KUB TECHNIQUE: After obtaining a scout radiograph a routine upper GI series was performed using thin barium. FLUOROSCOPY TIME:  Radiation Exposure Index (as provided by the fluoroscopic device): 261 dGy cm2 If the device does not provide the exposure index: Fluoroscopy Time (in minutes and seconds):  1 minutes 36 seconds. Number of Acquired Images:  11 COMPARISON:  CT abdomen pelvis 04/24/2015 and postoperative upper GI a 03/30/2015. FINDINGS: Scout view of the abdomen shows a normal bowel gas pattern. Patient is status post laparoscopic sleeve gastrectomy. Patient was able to drink a minimal amount of thin barium due to nausea and vomiting. However, there is dilatation of the proximal third of the stomach with a persistent area of narrowing at the junction of the proximal and middle thirds of the stomach. Contrast does pass through into the distal stomach. IMPRESSION: Dilated proximal stomach with a focal area of narrowing at the junction of the proximal and middle thirds of the stomach, status post gastric sleeve procedure. Electronically Signed   By: Lorin Picket M.D.   On: 05/20/2015 08:51    Microbiology: No results found for this or any previous visit (from the past 240 hour(s)).   Labs: Basic Metabolic Panel: No results for input(s): NA, K, CL, CO2, GLUCOSE, BUN, CREATININE, CALCIUM, MG, PHOS in the last 168 hours. Liver Function Tests: No results for input(s): AST, ALT,  ALKPHOS, BILITOT, PROT, ALBUMIN in the last 168 hours. No results for input(s): LIPASE, AMYLASE in the last 168 hours. No results for input(s): AMMONIA in the last 168 hours. CBC: No results for input(s): WBC, NEUTROABS, HGB, HCT, MCV, PLT in the last 168 hours. Cardiac Enzymes: No results for input(s): CKTOTAL, CKMB, CKMBINDEX, TROPONINI in the last 168 hours. BNP: BNP (last 3 results) No results for input(s): BNP in the last 8760 hours.  ProBNP (last 3 results) No results for input(s): PROBNP in the last 8760 hours.  CBG: No results for input(s): GLUCAP in the last 168 hours.  Active Problems:   Gastric outlet obstruction   Time coordinating discharge: <30 mins  Signed:  Erby Pian, ANP-BC

## 2015-06-16 ENCOUNTER — Other Ambulatory Visit: Payer: Self-pay

## 2015-06-16 NOTE — Patient Outreach (Signed)
[facility]) Care Management  06/16/2015  LATINA VERA 08-Aug-1985 [id number]  Member called to follow up from last weeks call.  States that she is now eating solid foods such as eggs and chicken.  States she is drinking a protein drink every day.  States she is still working on getting 64 oz of fluid in a day and she has been drinking milk.  States she has not been checking her blood sugars.  States she saw the RD on 06/14/15 and she is to see her again on 07/14/15.  States she is working and feeling much better. Plan to call in one month and will close case if no longer needing diabetes self management and diabetes medications. [facility]N CM Care Plan Problem One        Most Recent Value   Care Plan Problem One  Potential for altered fluid volume deficit related to nausea and vomiting s/p laparoscopic sleeve gastrectomy   Role Documenting the Problem One  Care Management Coordinator   Care Plan for Problem One  Active   THN CM Short Term Goal #1 (0-30 days)  Member will maintain nutrition/hydration and avoid admission to hospital for the next 30 days   [facility]N CM Short Term Goal #1 Start Date  05/30/15   Interventions for Short Term Goal #1  Reinforced to  member to be sure to drink adequate amounts of fluids and to drink her protein shakes as ordered to avoid geting dehydrated, Reinforcefto keep appointments with  RD, Reinforced s/s to call MD, Iinstructed that her case will be closed for Link to Wellness if she continues to lose wieght, remain off of diabetes medications and her blood sugars are normal      Peter Garter RN, [facility] Care Management Coordinator-Link to [place] Management [phone]

## 2015-07-14 ENCOUNTER — Encounter: Payer: Self-pay | Admitting: Dietician

## 2015-07-14 ENCOUNTER — Encounter: Payer: 59 | Attending: General Surgery | Admitting: Dietician

## 2015-07-14 VITALS — Ht 61.0 in | Wt 272.5 lb

## 2015-07-14 DIAGNOSIS — Z01818 Encounter for other preprocedural examination: Secondary | ICD-10-CM | POA: Insufficient documentation

## 2015-07-14 DIAGNOSIS — Z713 Dietary counseling and surveillance: Secondary | ICD-10-CM | POA: Diagnosis not present

## 2015-07-14 DIAGNOSIS — Z6841 Body Mass Index (BMI) 40.0 and over, adult: Secondary | ICD-10-CM | POA: Diagnosis not present

## 2015-07-14 NOTE — Progress Notes (Signed)
  Follow-up visit:  3.5 Months Post-Operative Sleeve Gastrectomy Surgery  Medical Nutrition Therapy:  Appt start time: [id number] end time: 1605   Primary concerns today: Post-operative Bariatric Surgery Nutrition Management. Returns with a 11 lbs weight loss. Works 3rd shift.   Not feeling hunger at this point. Having some carbs such as regular cranberry juice, pudding, and tried some pasta. Tolerating all foods at this point.   Surgery date: 03/29/2015 Surgery type: Sleeve Gastrectomy Start weight at [facility]: 352.5 lbs on 11/04/14 Weight today: 272.5 lbs  Weight change: 11 lbs Total weight loss: 80 lbs  TANITA  BODY COMP RESULTS  02/14/15 04/12/15 06/14/15 07/14/15   BMI (kg/m^2) 66.3 61.1 53.6 51.5   Fat Mass (lbs) 188 185.5 141.0 140.5   Fat Free Mass (lbs) 163 138.0 142.5 132.0   Total Body Water (lbs) 119.5 101.0 104.5 96.5    Preferred Learning Style:   No preference indicated   Learning Readiness:   Ready  24-hr recall: B (10 AM): egg with milk and cheese (8 g) Snk (AM): none L (6 PM): EAS or Atkins shake or morningstar burger,  had 5 piece nugget yesterday (15 g) Snk (PM): none D (9-10PM): EAS or Atkins shake  (15 g) Snk (PM): string cheese or yogurt or SF pudding (6-12 g)  Fluid intake: 22 oz protein shake, 20 oz water and diet cranberry or regular, 42 oz fluid total  Estimated total protein intake: 44-50 g with milk   Medications: see list Supplementation: taking MVI, not taking calcium or B12, forgets to take them   CBG monitoring: as needed Average CBG per patient: 70-80 mg/dl Last patient reported A1c: 5.4% in September   Using straws: sometimes at work  Drinking while eating: No Hair loss: No Carbonated beverages: No N/V/D/C:  Nausea after SF pudding, constipated last week and got a stool softener  Dumping syndrome: No  Recent physical activity:  [employer] - Zumba 2 x week, walking every other day for 45-60 minutes  Progress Towards Goal(s):  In  progress.  Handouts given during visit include:  none   Nutritional Diagnosis:  [place]-3.3 Overweight/obesity related to past poor dietary habits and physical inactivity as evidenced by patient w/ recent sleeve gastrectomy surgery following dietary guidelines for continued weight loss.  Intervention:  Nutrition education/reinforcement Goals:  Follow Phase 3B: High Protein + Non-Starchy Vegetables  Eat 3-6 small meals/snacks, every 3-5 hrs  Increase lean protein foods to meet 60g goal  Add one more Atkins/EAS protein shake (3 total) or 2 Premier Protein shakes in order to meet 60 grams per day  Try raw vegetables (salad)  Increase fluid intake to 64oz +  Bring crystal light or diet cranberry juice to work  Avoid drinking 15 minutes before, during and 30 minutes after eating  Aim for >30 min of physical activity daily  Get back to taking calcium citrate, and Vitamin B12  Look for additional iron and Vitamin D supplements     Teaching Method Utilized:  Visual Auditory Hands on  Barriers to learning/adherence to lifestyle change: none  Demonstrated degree of understanding via:  Teach Back   Monitoring/Evaluation:  Dietary intake, exercise, and body weight. Follow up in 1 months for 4.5 month post-op visit.

## 2015-07-14 NOTE — Patient Instructions (Addendum)
Goals:  Follow Phase 3B: High Protein + Non-Starchy Vegetables  Eat 3-6 small meals/snacks, every 3-5 hrs  Increase lean protein foods to meet 60g goal  Add one more Atkins/EAS protein shake (3 total) or 2 Premier Protein shakes in order to meet 60 grams per day  Try raw vegetables (salad)  Increase fluid intake to 64oz +  Bring crystal light or diet cranberry juice to work  Avoid drinking 15 minutes before, during and 30 minutes after eating  Aim for >30 min of physical activity daily  Get back to taking calcium citrate, and Vitamin B12  Look for additional iron and Vitamin D supplements   Surgery date: 03/29/2015 Surgery type: Sleeve Gastrectomy Start weight at [facility]: 352.5 lbs on 11/04/14 Weight today: 272.5 lbs  Weight change: 11 lbs Total weight loss: 80 lbs  TANITA  BODY COMP RESULTS  02/14/15 04/12/15 06/14/15 07/14/15   BMI (kg/m^2) 66.3 61.1 53.6 51.5   Fat Mass (lbs) 188 185.5 141.0 140.5   Fat Free Mass (lbs) 163 138.0 142.5 132.0   Total Body Water (lbs) 119.5 101.0 104.5 96.5

## 2015-07-15 ENCOUNTER — Other Ambulatory Visit: Payer: Self-pay

## 2015-07-15 NOTE — Patient Outreach (Signed)
[facility]) Care Management  07/15/2015  Lorraine Chambers 11-30-1985 [id number]  Called member to follow up on status.  States she is not on any medication for diabetes and the few times she has checked her blood sugars they were 77 and 88 after eating.  States her nausea is better but the RD told her she still needs to eat more protein and drink enough fluids.  States she is going to Zumba 3 times a week and walking on the treadmill several times a week.  States she is averaging 8000-10000 steps a day.  States she has lost over 80 lbs now. Instructed that she will be closed from the Link to Wellness program as she no longer needs our services, free medications and testing strips.  Instructed to call if her her needs change.   Plan to close case as goals achieved. Peter Garter RN, [facility] Care Management Coordinator-Link to [place] Management [phone]

## 2015-08-29 ENCOUNTER — Ambulatory Visit (INDEPENDENT_AMBULATORY_CARE_PROVIDER_SITE_OTHER): Payer: 59 | Admitting: Family Medicine

## 2015-08-29 VITALS — BP 128/88 | HR 83 | Temp 98.6°F | Resp 16 | Ht 61.0 in | Wt 251.0 lb

## 2015-08-29 DIAGNOSIS — R5381 Other malaise: Secondary | ICD-10-CM | POA: Diagnosis not present

## 2015-08-29 DIAGNOSIS — M25522 Pain in left elbow: Secondary | ICD-10-CM | POA: Diagnosis not present

## 2015-08-29 LAB — GLUCOSE, POCT (MANUAL RESULT ENTRY): POC Glucose: 125 mg/dl — AB (ref 70–99)

## 2015-08-29 MED ORDER — KETOROLAC TROMETHAMINE 30 MG/ML IJ SOLN
30.0000 mg | Freq: Once | INTRAMUSCULAR | Status: AC
  Administered 2015-08-29: 30 mg via INTRAMUSCULAR

## 2015-08-29 NOTE — Patient Instructions (Signed)
I think that you suffered an acute overuse injury of your left elbow which caused strain to the tendon- tendonitis.   Continue to use ice and rest the elbow until better- however be sure to move it around several times a day to prevent stiffness Tylenol may be helpful for you When you do return to weight lifting build up your strength a bit more slowly and avoid doing so many reps of a new exercise at once!   Let me know if your elbow is not improving over the next few days  congrats on your weight loss so far!

## 2015-08-29 NOTE — Progress Notes (Signed)
[facility] [address], [place] [address] [phone]  Date:  08/29/2015   Name:  Lorraine Chambers   DOB:  1986-06-24   MRN:  [id number]  PCP:  Gerrit Heck, MD    Chief Complaint: Arm Pain   History of Present Illness:  Lorraine Chambers is a 30 y.o. very pleasant female patient who presents with the following:  Today is Monday- on Friday she was working out at the gym and did 100 chest presses with a 25lb kettlebell.  This was the first time she had tried this exercise. On Saturday she was ok but by Sunday her left elbow felt sore.  She went to work today but the elbow hurt so much that she could not move it and decided to come in to be seen instead No other joints are painful.  She otherwise feels well.  No fall or other injury to the elbow  She had weight loss surgery in August and started exercising in November. She mostly does zumba Never had issues with her elbow in the past  She only takes tylenol due to her gastric bypass surgery- she does not like to take anything else for pain.  She has not taken any tylenol for this today  So far she has lost over 100 lbs!  Her glucose has been under much better control since her weight loss  Wt Readings from Last 3 Encounters:  08/29/15 251 lb (113.853 kg)  07/14/15 272 lb 8 oz (123.605 kg)  06/14/15 283 lb 8 oz (128.595 kg)     Patient Active Problem List   Diagnosis Date Noted  . Nausea & vomiting 06/06/2015  . Gastric outlet obstruction 05/21/2015  . Fatty liver disease, nonalcoholic 0000000  . Morbid obesity with BMI of 60.0-69.9, adult ([place]) 03/29/2015  . S/P laparoscopic sleeve gastrectomy 03/29/2015  . Upper airway cough syndrome/ noct "wheeze" typical of pseudoasthma  01/15/2015  . Hypoxia 08/23/2014  . Hypothyroidism 08/23/2014  . Tachycardia 08/23/2014  . DM (diabetes mellitus), type 2 ([place]) 08/23/2014  . Hydradenitis 02/09/2014  . Iron deficiency anemia  02/09/2014  . Leukocytosis 02/09/2014  . Reactive thrombocytosis 02/09/2014  . Immune to hepatitis B 09/24/2013  . Positive hepatitis C antibody test 09/24/2013  . Hepatitis C antibody test positive 09/24/2013  . HERPES SIMPLEX INFECTION, TYPE I 07/07/2008  . MICROSCOPIC HEMATURIA 07/07/2008  . VAGINITIS, BACTERIAL 07/07/2008  . Severe obesity (BMI >= 40) ([place]) 04/16/2008  . HYPERTENSION, BENIGN ESSENTIAL 04/16/2008  . ALLERGIC RHINITIS 04/16/2008    Past Medical History  Diagnosis Date  . Hydradenitis 02/09/2014  . Iron deficiency anemia, unspecified 02/09/2014  . Pneumonia   . GERD (gastroesophageal reflux disease)   . Headache   . Arthritis     left knee  . Hypertension     Pt reports episode of HTN in 2009--no longer an issue 05/2015  . Thyroid disease   . Hypothyroidism 08/23/2014    resolved since gastric sleeve surgery   . Obesity   . Heart murmur     ECHO last friday10/7/16 see cardiology next month Nov 2016  . Diabetes mellitus without complication ([place])   . DM (diabetes mellitus), type 2 ([place]) 08/23/2014    last A1C 5.5 % per patient. 05/2015    Past Surgical History  Procedure Laterality Date  . Wisdom tooth extraction    . Laparoscopic gastric sleeve resection N/A 03/29/2015    Procedure: LAPAROSCOPIC GASTRIC SLEEVE RESECTION WITH UPPER ENDO;  Surgeon: Greer Pickerel, MD;  Location: [facility];  Service: General;  Laterality: N/A;  . Upper gi endoscopy  03/29/2015    Procedure: UPPER GI ENDOSCOPY;  Surgeon: Greer Pickerel, MD;  Location: [facility];  Service: General;;  . Esophagogastroduodenoscopy (egd) with propofol N/A 05/23/2015    Procedure: ESOPHAGOGASTRODUODENOSCOPY (EGD) WITH PROPOFOL;  Surgeon: Alphonsa Overall, MD;  Location: [facility];  Service: General;  Laterality: N/A;    Social History  Substance Use Topics  . Smoking status: Never Smoker   . Smokeless tobacco: Never Used  . Alcohol Use: Yes     Comment: occassional, not since august     Family History   Problem Relation Age of Onset  . Stroke Other   . Heart failure Other   . Heart disease Mother   . Diabetes Mother   . Hypertension Mother   . Pulmonary fibrosis Mother   . Cancer Maternal Uncle     brain ca  . Diabetes Father   . Arthritis Father   . Other Father   . Pulmonary fibrosis Maternal Grandmother   . Heart murmur    . Heart murmur Sister     Allergies  Allergen Reactions  . Bee Venom Anaphylaxis    Medication list has been reviewed and updated.  Current Outpatient Prescriptions on File Prior to Visit  Medication Sig Dispense Refill  . metoCLOPramide (REGLAN) 5 MG/5ML solution Take 10 mLs (10 mg total) by mouth 4 (four) times daily -  before meals and at bedtime. (Patient not taking: Reported on 08/29/2015) 240 mL 0  . nitrofurantoin (MACRODANTIN) 100 MG capsule Take 1 capsule (100 mg total) by mouth every 12 (twelve) hours. (Patient not taking: Reported on 08/29/2015) 10 capsule 0  . pantoprazole (PROTONIX) 40 MG tablet Take 1 tablet (40 mg total) by mouth daily. (Patient not taking: Reported on 06/14/2015) 30 tablet 0  . promethazine (PHENERGAN) 6.25 MG/5ML syrup Take 5-10 mLs (6.25-12.5 mg total) by mouth every 6 (six) hours as needed. For nausea (Patient not taking: Reported on 08/29/2015) 120 mL 0   No current facility-administered medications on file prior to visit.    Review of Systems:  As per HPI- otherwise negative.   Physical Examination: Filed Vitals:   08/29/15 0929  BP: 128/88  Pulse: 83  Temp: 98.6 F (37 C)  Resp: 16   Filed Vitals:   08/29/15 0929  Height: 5\' 1"  (1.549 m)  Weight: 251 lb (113.853 kg)   Body mass index is 47.45 kg/(m^2). Ideal Body Weight: Weight in (lb) to have BMI = 25: 132  GEN: WDWN, NAD, Non-toxic, A & O x 3, obese, looks well HEENT: Atraumatic, Normocephalic. Neck supple. No masses, No LAD. Ears and Nose: No external deformity. CV: RRR, No M/G/R. No JVD. No thrill. No extra heart sounds. PULM: CTA B, no  wheezes, crackles, rhonchi. No retractions. No resp. distress. No accessory muscle use. ABD: S, NT, ND, +BS. No rebound. No HSM. EXTR: No c/c/e NEURO Normal gait.  PSYCH: Normally interactive. Conversant. Not depressed or anxious appearing.  Calm demeanor.  Left elbow: she has tenderness over the lateral epidcondyle and holds the elbow flexed at 90.  She is able to flex it more and supinate/ pronate but will not extend the elbow   Given 30 mg of toradol for pain and waited about 15 minutes.  She felt a little vagal- checked her glucose which was ok, gave her some juice and she felt better  Results for orders placed  or performed in visit on 08/29/15  POCT glucose (manual entry)  Result Value Ref Range   POC Glucose 125 (A) 70 - 99 mg/dl   Following toradol was able to extend the elbow to about 120 degrees Assessment and Plan: Left elbow pain - Plan: ketorolac (TORADOL) 30 MG/ML injection 30 mg  [place]alaise - Plan: POCT glucose (manual entry)  Here today with left elbow pain due to acute overuse and lateral epicondylitis.   Placed in a sling for comfort and urged to do ROM several times a day Counseled about gradually increasing her weight training and exercise routine She will let me know if not better very soon  Signed Lamar Blinks, MD

## 2015-08-30 ENCOUNTER — Ambulatory Visit (INDEPENDENT_AMBULATORY_CARE_PROVIDER_SITE_OTHER): Payer: 59

## 2015-08-30 ENCOUNTER — Ambulatory Visit (INDEPENDENT_AMBULATORY_CARE_PROVIDER_SITE_OTHER): Payer: 59 | Admitting: Family Medicine

## 2015-08-30 VITALS — BP 122/78 | HR 81 | Temp 98.0°F | Resp 18 | Ht 61.0 in | Wt 250.0 lb

## 2015-08-30 DIAGNOSIS — M779 Enthesopathy, unspecified: Secondary | ICD-10-CM | POA: Diagnosis not present

## 2015-08-30 DIAGNOSIS — M25522 Pain in left elbow: Secondary | ICD-10-CM

## 2015-08-30 DIAGNOSIS — T148XXA Other injury of unspecified body region, initial encounter: Secondary | ICD-10-CM

## 2015-08-30 MED ORDER — HYDROCODONE-ACETAMINOPHEN 5-325 MG PO TABS: 1.0000 | ORAL_TABLET | Freq: Three times a day (TID) | ORAL | Status: DC | PRN

## 2015-08-30 MED ORDER — DICLOFENAC SODIUM 1 % TD GEL: 2.0000 g | Freq: Four times a day (QID) | TRANSDERMAL | Status: DC

## 2015-08-30 NOTE — Progress Notes (Signed)
Chief Complaint:  Chief Complaint  Patient presents with  . Follow-up    left arm   . Arm Pain    left   . Arm Injury    left     HPI: Lorraine Chambers is a 30 y.o. female who reports to [facility] today complaining of  Left elbow pain after working out with 25 lb kettle bells, she was doing chest presses. She was seen yesterday and dx with tendonitis but wants xray due to decrease ROMa nd pain.  She has been in sling and also given injection of toradol without releif. She has throbbing and radiating pain from her fist when she makes a fist or when she moves it. She is right hand domiannt. She has had sleeve gastric bypass, she is doing well. Tries to avoid oral NSAIDs . She has taken tylenol without releif. She has no constipation problems.   Past Medical History  Diagnosis Date  . Hydradenitis 02/09/2014  . Iron deficiency anemia, unspecified 02/09/2014  . Pneumonia   . GERD (gastroesophageal reflux disease)   . Headache   . Arthritis     left knee  . Hypertension     Pt reports episode of HTN in 2009--no longer an issue 05/2015  . Thyroid disease   . Hypothyroidism 08/23/2014    resolved since gastric sleeve surgery   . Obesity   . Heart murmur     ECHO last friday10/7/16 see cardiology next month Nov 2016  . Diabetes mellitus without complication ([place])   . DM (diabetes mellitus), type 2 ([place]) 08/23/2014    last A1C 5.5 % per patient. 05/2015   Past Surgical History  Procedure Laterality Date  . Wisdom tooth extraction    . Laparoscopic gastric sleeve resection N/A 03/29/2015    Procedure: LAPAROSCOPIC GASTRIC SLEEVE RESECTION WITH UPPER ENDO;  Surgeon: Greer Pickerel, MD;  Location: [facility];  Service: General;  Laterality: N/A;  . Upper gi endoscopy  03/29/2015    Procedure: UPPER GI ENDOSCOPY;  Surgeon: Greer Pickerel, MD;  Location: [facility];  Service: General;;  . Esophagogastroduodenoscopy (egd) with propofol N/A 05/23/2015    Procedure: ESOPHAGOGASTRODUODENOSCOPY (EGD)  WITH PROPOFOL;  Surgeon: Alphonsa Overall, MD;  Location: [facility];  Service: General;  Laterality: N/A;   Social History   Social History  . Marital Status: Married    Spouse Name: N/A  . Number of Children: N/A  . Years of Education: N/A   Social History Main Topics  . Smoking status: Never Smoker   . Smokeless tobacco: Never Used  . Alcohol Use: Yes     Comment: occassional, not since august   . Drug Use: No  . Sexual Activity: Yes    Birth Control/ Protection: IUD, Condom     Comment: IUD came out and has to be replaced 06/07/2015   Other Topics Concern  . None   Social History Narrative   Family History  Problem Relation Age of Onset  . Stroke Other   . Heart failure Other   . Heart disease Mother   . Diabetes Mother   . Hypertension Mother   . Pulmonary fibrosis Mother   . Cancer Maternal Uncle     brain ca  . Diabetes Father   . Arthritis Father   . Other Father   . Pulmonary fibrosis Maternal Grandmother   . Heart murmur    . Heart murmur Sister    Allergies  Allergen Reactions  . Bee  Venom Anaphylaxis   Prior to Admission medications   Not on File     ROS: The patient denies fevers, chills, night sweats, unintentional weight loss, chest pain, palpitations, wheezing, dyspnea on exertion, nausea, vomiting, abdominal pain, dysuria, hematuria, melena, All other systems have been reviewed and were otherwise negative with the exception of those mentioned in the HPI and as above.    PHYSICAL EXAM: Filed Vitals:   08/30/15 1820  BP: 122/78  Pulse: 81  Temp: 98 F (36.7 C)  Resp: 18   Body mass index is 47.26 kg/(m^2).   General: Alert, no acute distress HEENT:  Normocephalic, atraumatic, oropharynx patent. EOMI, PERRLA Cardiovascular:  Regular rate and rhythm, no rubs murmurs or gallops.  Marland Kitchen  Respiratory: Clear to auscultation bilaterally.  No wheezes, rales, or rhonchi.  No cyanosis, no use of accessory musculature Abdominal: No organomegaly,  abdomen is soft and non-tender, positive bowel sounds. No masses. Skin: No rashes. Neurologic: Facial musculature symmetric. Psychiatric: Patient acts appropriately throughout our interaction. Lymphatic: No cervical or submandibular lymphadenopathy Musculoskeletal: Gait intact.  + left elbow tenderness, warmth, minimal effusion No ecchymoses Limited exam due to pain, she can supinate and pronate, cannot extend , she can flex to 30 deg Tender at lateral epicondyle Sensation intact, 5 out of 5 strength grip strength   LABS: Results for orders placed or performed in visit on 08/29/15  POCT glucose (manual entry)  Result Value Ref Range   POC Glucose 125 (A) 70 - 99 mg/dl     EKG/XRAY:   Primary read interpreted by Dr. Marin Comment at [facility]. Neg for any fractures or dislocation   ASSESSMENT/PLAN: Encounter Diagnoses  Name Primary?  . Left elbow pain Yes  . Sprain and strain   . Tendonitis    Cont with sling RICE rx Diclofenac gel and also norco prn with precautions Fu in 1 week prn , work note given , advise sehs can use tennis elbow brace otc if desired  Gross sideeffects, risk and benefits, and alternatives of medications d/w patient. Patient is aware that all medications have potential sideeffects and we are unable to predict every sideeffect or drug-drug interaction that may occur.  Chrisangel Eskenazi DO  08/31/2015 8:58 AM

## 2015-08-31 ENCOUNTER — Encounter: Payer: Self-pay | Admitting: Dietician

## 2015-08-31 ENCOUNTER — Encounter: Payer: 59 | Attending: General Surgery | Admitting: Dietician

## 2015-08-31 DIAGNOSIS — Z6841 Body Mass Index (BMI) 40.0 and over, adult: Secondary | ICD-10-CM | POA: Insufficient documentation

## 2015-08-31 DIAGNOSIS — Z01818 Encounter for other preprocedural examination: Secondary | ICD-10-CM | POA: Diagnosis not present

## 2015-08-31 DIAGNOSIS — Z713 Dietary counseling and surveillance: Secondary | ICD-10-CM | POA: Insufficient documentation

## 2015-08-31 NOTE — Patient Instructions (Addendum)
Goals:  Follow Phase 3B: High Protein + Non-Starchy Vegetables  Eat 3-6 small meals/snacks, every 3-5 hrs  Increase lean protein foods to meet 60g goal  Add one more Atkins/EAS protein shake (3 total) or 2 Premier Protein shakes in order to meet 60 grams per day  Try raw vegetables (salad)  Increase fluid intake to 64oz +  Bring crystal light or diet cranberry juice to work  Avoid drinking 15 minutes before, during and 30 minutes after eating  Aim for >30 min of physical activity daily  Get back to taking calcium citrate, and Vitamin B12  Look for additional iron and Vitamin D supplements   Include fruit before workout (watch the portions!)  Surgery date: 03/29/2015 Surgery type: Sleeve Gastrectomy Start weight at [facility]: 352.5 lbs on 11/04/14 Weight today: 251 lbs Weight change: 21.5 lbs Total weight loss: 101.5 lbs Weight loss goal: 140-180 lbs  TANITA  BODY COMP RESULTS  02/14/15 04/12/15 06/14/15 07/14/15 08/31/15   BMI (kg/m^2) 66.3 61.1 53.6 51.5 47.5   Fat Mass (lbs) 188 185.5 141.0 140.5 123   Fat Free Mass (lbs) 163 138.0 142.5 132.0 128   Total Body Water (lbs) 119.5 101.0 104.5 96.5 93.5

## 2015-08-31 NOTE — Progress Notes (Signed)
Follow-up visit:  5 Months Post-Operative Sleeve Gastrectomy Surgery  Medical Nutrition Therapy:  Appt start time: 320 end time: 350  Primary concerns today: Post-operative Bariatric Surgery Nutrition Management. Returns with a 21.5 lbs weight loss. Works 3rd shift.   Recently hurt her elbow working out. Has not been having much vomiting at all lately. Has been eating Arctic Zero ice cream (7-11g CHO and 3-5g protein) when she wants something sweet. Has had some low blood sugars with exercise. Still struggling to meet fluid needs. Feeling frustrated that she is unable to exercise right now and has been emotionally eating.   Non scale victories: cute workout clothes, able to walk easier (likes to walk!), glasses needed tightening    Goal: BMI not in the obese category  Surgery date: 03/29/2015 Surgery type: Sleeve Gastrectomy Start weight at [facility]: 352.5 lbs on 11/04/14 Weight today: 251 lbs Weight change: 21.5 lbs Total weight loss: 101.5 lbs Weight loss goal: 140-180 lbs  TANITA  BODY COMP RESULTS  02/14/15 04/12/15 06/14/15 07/14/15 08/31/15   BMI (kg/m^2) 66.3 61.1 53.6 51.5 47.5   Fat Mass (lbs) 188 185.5 141.0 140.5 123   Fat Free Mass (lbs) 163 138.0 142.5 132.0 128   Total Body Water (lbs) 119.5 101.0 104.5 96.5 93.5    Preferred Learning Style:   No preference indicated   Learning Readiness:   Ready  24-hr recall: B (10 AM): Premier Protein shake (30g) Snk (AM): peanuts or almonds L (6 PM): scrambled egg OR salad with boiled egg, tuna, and shrimp (7-14g) Snk (PM): Premier Protein shake (30g)  Fluid intake: 22 oz protein shake, 20 oz water and diet cranberry or regular, 42 oz fluid total  Estimated total protein intake: 44-50 g with milk   Medications: see list Supplementation: taking MVI, not taking calcium or B12, forgets to take them ("better at remembering to take them")  CBG monitoring: as needed Average CBG per patient: 70-80 mg/dl Last patient reported A1c:  5.4% in September   Using straws: sometimes at work  Drinking while eating: No Hair loss: No Carbonated beverages: No N/V/D/C: Vomits rarely, constipated last week and got a stool softener  Dumping syndrome: No  Recent physical activity:  [employer] - Zumba 2 x week, walking every other day for 30 minutes  Progress Towards Goal(s):  In progress.  Handouts given during visit include:  none   Nutritional Diagnosis:  [place]-3.3 Overweight/obesity related to past poor dietary habits and physical inactivity as evidenced by patient w/ recent sleeve gastrectomy surgery following dietary guidelines for continued weight loss.  Intervention:  Nutrition education/reinforcement Goals:  Follow Phase 3B: High Protein + Non-Starchy Vegetables  Eat 3-6 small meals/snacks, every 3-5 hrs  Increase lean protein foods to meet 60g goal  Add one more Atkins/EAS protein shake (3 total) or 2 Premier Protein shakes in order to meet 60 grams per day  Try raw vegetables (salad)  Increase fluid intake to 64oz +  Bring crystal light or diet cranberry juice to work  Avoid drinking 15 minutes before, during and 30 minutes after eating  Aim for >30 min of physical activity daily  Get back to taking calcium citrate, and Vitamin B12  Look for additional iron and Vitamin D supplements     Teaching Method Utilized:  Visual Auditory Hands on  Barriers to learning/adherence to lifestyle change: none  Demonstrated degree of understanding via:  Teach Back   Monitoring/Evaluation:  Dietary intake, exercise, and body weight. Follow up in 6 weeks  for 6.5 month post-op visit.

## 2015-09-01 DIAGNOSIS — Z9884 Bariatric surgery status: Secondary | ICD-10-CM | POA: Diagnosis not present

## 2015-09-01 DIAGNOSIS — Z6841 Body Mass Index (BMI) 40.0 and over, adult: Secondary | ICD-10-CM | POA: Diagnosis not present

## 2015-09-01 DIAGNOSIS — E039 Hypothyroidism, unspecified: Secondary | ICD-10-CM | POA: Diagnosis not present

## 2015-09-01 DIAGNOSIS — E119 Type 2 diabetes mellitus without complications: Secondary | ICD-10-CM | POA: Diagnosis not present

## 2015-09-01 DIAGNOSIS — E559 Vitamin D deficiency, unspecified: Secondary | ICD-10-CM | POA: Diagnosis not present

## 2015-09-01 DIAGNOSIS — I10 Essential (primary) hypertension: Secondary | ICD-10-CM | POA: Diagnosis not present

## 2015-09-07 DIAGNOSIS — Z30430 Encounter for insertion of intrauterine contraceptive device: Secondary | ICD-10-CM | POA: Diagnosis not present

## 2015-09-07 DIAGNOSIS — Z30431 Encounter for routine checking of intrauterine contraceptive device: Secondary | ICD-10-CM | POA: Diagnosis not present

## 2015-09-16 DIAGNOSIS — Z111 Encounter for screening for respiratory tuberculosis: Secondary | ICD-10-CM | POA: Diagnosis not present

## 2015-09-23 ENCOUNTER — Ambulatory Visit (INDEPENDENT_AMBULATORY_CARE_PROVIDER_SITE_OTHER): Payer: 59 | Admitting: Internal Medicine

## 2015-09-23 VITALS — BP 118/80 | HR 74 | Temp 98.7°F | Resp 16 | Ht 59.5 in | Wt 244.6 lb

## 2015-09-23 DIAGNOSIS — M25562 Pain in left knee: Secondary | ICD-10-CM

## 2015-09-23 DIAGNOSIS — Z9884 Bariatric surgery status: Secondary | ICD-10-CM

## 2015-09-23 MED ORDER — PREDNISONE 20 MG PO TABS: ORAL_TABLET | ORAL | Status: DC

## 2015-09-23 NOTE — Progress Notes (Signed)
Subjective:  By signing my name below, I, Rawaa Al Rifaie, attest that this documentation has been prepared under the direction and in the presence of Tami Lin, MD  [place], Medical Scribe. 09/23/2015.  3:41 PM.   Patient ID: Lorraine Chambers, female    DOB: Nov 02, 1985, 30 y.o.   MRN: [id number]  Chief Complaint  Patient presents with  . Knee Pain    Left knee, x 2 days    HPI HPI Comments: Lorraine Chambers is a 30 y.o. female who presents to [facility] complaining of left knee pain, onset 2 days ago.  Pt states that the pain is in the frontal area of the knee and notes that she is unable to bend the knee. She notes that the area was swollen yesterday and she applied an ice pack on it that help decrease the swelling. Pt indicates that the pain is exacerbated with activity, she however does not experience pain when she gets up from a sitting position. Pt was exercising on a treadmill yesterday when the pain worsened. She applied a topical gel on the area that she was prescribed for her elbow pain, however with no relief. She reports a history of arthritis in the knee. Pt notes that she cannot take any pain medications currently due to s/p sleeve gastrectomy, therefore she is requesting a prescription for an antiinflammatory. She indicates that she recently had a surgery on the area in August of last year from which her baseline knee pain has significantly decreased. She reports that after the surgery, she started exercising about an hour per day. She denies traumatic injury.    Patient Active Problem List   Diagnosis Date Noted  . Nausea & vomiting 06/06/2015  . Gastric outlet obstruction 05/21/2015  . Fatty liver disease, nonalcoholic 0000000  . Morbid obesity with BMI of 60.0-69.9, adult ([place]) 03/29/2015  . S/P laparoscopic sleeve gastrectomy 03/29/2015  . Upper airway cough syndrome/ noct "wheeze" typical of pseudoasthma  01/15/2015  .  Hypoxia 08/23/2014  . Hypothyroidism 08/23/2014  . Tachycardia 08/23/2014  . DM (diabetes mellitus), type 2 ([place]) 08/23/2014  . Hydradenitis 02/09/2014  . Iron deficiency anemia 02/09/2014  . Leukocytosis 02/09/2014  . Reactive thrombocytosis 02/09/2014  . Immune to hepatitis B 09/24/2013  . Positive hepatitis C antibody test 09/24/2013  . Hepatitis C antibody test positive 09/24/2013  . HERPES SIMPLEX INFECTION, TYPE I 07/07/2008  . MICROSCOPIC HEMATURIA 07/07/2008  . VAGINITIS, BACTERIAL 07/07/2008  . Severe obesity (BMI >= 40) ([place]) 04/16/2008  . HYPERTENSION, BENIGN ESSENTIAL 04/16/2008  . ALLERGIC RHINITIS 04/16/2008   Past Medical History  Diagnosis Date  . Hydradenitis 02/09/2014  . Iron deficiency anemia, unspecified 02/09/2014  . Pneumonia   . GERD (gastroesophageal reflux disease)   . Headache   . Arthritis     left knee  . Hypertension     Pt reports episode of HTN in 2009--no longer an issue 05/2015  . Thyroid disease   . Hypothyroidism 08/23/2014    resolved since gastric sleeve surgery   . Obesity   . Heart murmur     ECHO last friday10/7/16 see cardiology next month Nov 2016  . Diabetes mellitus without complication ([place])   . DM (diabetes mellitus), type 2 ([place]) 08/23/2014    last A1C 5.5 % per patient. 05/2015   Past Surgical History  Procedure Laterality Date  . Wisdom tooth extraction    . Laparoscopic gastric sleeve resection N/A 03/29/2015  Procedure: LAPAROSCOPIC GASTRIC SLEEVE RESECTION WITH UPPER ENDO;  Surgeon: Greer Pickerel, MD;  Location: [facility];  Service: General;  Laterality: N/A;  . Upper gi endoscopy  03/29/2015    Procedure: UPPER GI ENDOSCOPY;  Surgeon: Greer Pickerel, MD;  Location: [facility];  Service: General;;  . Esophagogastroduodenoscopy (egd) with propofol N/A 05/23/2015    Procedure: ESOPHAGOGASTRODUODENOSCOPY (EGD) WITH PROPOFOL;  Surgeon: Alphonsa Overall, MD;  Location: [facility];  Service: General;  Laterality: N/A;   Allergies  Allergen  Reactions  . Bee Venom Anaphylaxis   Prior to Admission medications   Medication Sig Start Date End Date Taking? Authorizing Provider  HYDROcodone-acetaminophen (NORCO) 5-325 MG tablet Take 1 tablet by mouth every 8 (eight) hours as needed for moderate pain. NO extra tylenol with this, take with stool softener if needed 08/30/15  Yes Thao P Le, DO  diclofenac sodium (VOLTAREN) 1 % GEL Apply 2 g topically 4 (four) times daily. Patient not taking: Reported on 09/23/2015 08/30/15   Lorraine Bayley, DO   Social History   Social History  . Marital Status: Married    Spouse Name: N/A  . Number of Children: N/A  . Years of Education: N/A   Occupational History  . Not on file.   Social History Main Topics  . Smoking status: Never Smoker   . Smokeless tobacco: Never Used  . Alcohol Use: Yes     Comment: occassional, not since august   . Drug Use: No  . Sexual Activity: Yes    Birth Control/ Protection: IUD, Condom     Comment: IUD came out and has to be replaced 06/07/2015   Other Topics Concern  . Not on file   Social History Narrative    Review of Systems  Musculoskeletal: Positive for joint swelling and arthralgias.      Objective:   Physical Exam  Constitutional: She is oriented to person, place, and time. She appears well-developed and well-nourished. No distress.  HENT:  Head: Normocephalic and atraumatic.  Eyes: EOM are normal. Pupils are equal, round, and reactive to light.  Neck: Neck supple.  Cardiovascular: Normal rate.   Pulmonary/Chest: Effort normal.  Musculoskeletal: She exhibits edema.  Min swelling on the left knee with no redness. Ligament are stable. MCL is tender with stressor. The medial knee is also tender to pressure over the lower part of the ITB.  Neurological: She is alert and oriented to person, place, and time. No cranial nerve deficit.  Skin: Skin is warm and dry.  Psychiatric: She has a normal mood and affect. Her behavior is normal.  Nursing note and  vitals reviewed.   BP 118/80 mmHg  Pulse 74  Temp(Src) 98.7 F (37.1 C) (Oral)  Resp 16  Ht 4' 11.5" (1.511 m)  Wt 244 lb 9.6 oz (110.95 kg)  BMI 48.60 kg/m2  SpO2 98%  LMP 08/18/2015      Assessment & Plan:  S/P laparoscopic sleeve gastrectomy  Knee pain, left --see knee pain L 2014--could be degen meniscus 2 to obesity BUT exam favors IT band tendonitis at distal end  For now--ice/heat/rom exercises Meds ordered this encounter  Medications  . predniSONE (DELTASONE) 20 MG tablet    Sig: 3/2/1 single daily dose for 3 days    Dispense:  6 tablet    Refill:  0   Tylenol Call 7d if not responding for ortho referral  I have completed the patient encounter in its entirety as documented by the scribe, with editing by  me where necessary. Kaycie Pegues P. Laney Pastor, M.D.

## 2015-09-28 DIAGNOSIS — R931 Abnormal findings on diagnostic imaging of heart and coronary circulation: Secondary | ICD-10-CM | POA: Diagnosis not present

## 2015-09-28 DIAGNOSIS — Z9884 Bariatric surgery status: Secondary | ICD-10-CM | POA: Diagnosis not present

## 2015-09-28 DIAGNOSIS — M25562 Pain in left knee: Secondary | ICD-10-CM | POA: Diagnosis not present

## 2015-10-26 ENCOUNTER — Ambulatory Visit: Payer: 59 | Admitting: Dietician

## 2015-11-02 ENCOUNTER — Encounter: Payer: 59 | Attending: General Surgery | Admitting: Dietician

## 2015-11-02 ENCOUNTER — Encounter: Payer: Self-pay | Admitting: Dietician

## 2015-11-02 DIAGNOSIS — Z01818 Encounter for other preprocedural examination: Secondary | ICD-10-CM | POA: Insufficient documentation

## 2015-11-02 DIAGNOSIS — Z6841 Body Mass Index (BMI) 40.0 and over, adult: Secondary | ICD-10-CM | POA: Diagnosis not present

## 2015-11-02 DIAGNOSIS — Z713 Dietary counseling and surveillance: Secondary | ICD-10-CM | POA: Insufficient documentation

## 2015-11-02 NOTE — Progress Notes (Signed)
Follow-up visit:  7 Months Post-Operative Sleeve Gastrectomy Surgery  Medical Nutrition Therapy:  Appt start time: 1150 end time: 1220  Primary concerns today: Post-operative Bariatric Surgery Nutrition Management. Returns with a 20 lbs weight loss. Works 3rd shift. Doing Zumba 7 x week and feels like she needs to start lifting weights. Feeling good overall.   Has one cheat meal per week. Got the [employer] chips which she doesn't like. Trying to stay under 30 g of carbs per day. Able to tolerate all foods. Cannot tell when blood sugar is low, though usually stays in 70-80 range. Exercise will make blood sugar drop to 40-50. Does not usually eat before exercising. Has thrown up if eating before exercise.   Goal: BMI not in the obese category  Surgery date: 03/29/2015 Surgery type: Sleeve Gastrectomy Start weight at [facility]: 352.5 lbs on 11/04/14 Weight today: 231.0  lbs  Weight change: 20 lbs Total weight loss: 121.5 lbs Weight loss goal: 140-180 lbs  TANITA  BODY COMP RESULTS  02/14/15 04/12/15 06/14/15 07/14/15 08/31/15 11/02/15   BMI (kg/m^2) 66.3 61.1 53.6 51.5 47.5 43.6   Fat Mass (lbs) 188 185.5 141.0 140.5 123 100.5   Fat Free Mass (lbs) 163 138.0 142.5 132.0 128 130.5   Total Body Water (lbs) 119.5 101.0 104.5 96.5 93.5 95.5    Preferred Learning Style:   No preference indicated   Learning Readiness:   Ready  24-hr recall: B (10 AM): Premier Protein shake (30g) Snk (AM): almonds L (6 PM): scrambled egg OR salad with boiled egg, tuna, and shrimp or stir fries (7-14g) Snk (PM): Premier Protein shake (30g)  Fluid intake: 22 oz protein shake, 20 oz water and 15 calorie juice, 64 oz fluid total  Estimated total protein intake: 60+ grams  Medications: see list Supplementation: taking MVI, not taking calcium or B12, forgets to take them ("better at remembering to take them")  CBG monitoring: as needed Average CBG per patient: 70-80 mg/dl Last patient reported A1c: 5.4% in September    Using straws: No  Drinking while eating: No Hair loss: No Carbonated beverages: No N/V/D/C: Has a BM about 1 x week and tried a "[place] tea" which helps her to go more often. Plans to use 2-3 x week Dumping syndrome: No  Recent physical activity:  [employer] - Zumba 7 x week, treadmill and elliptical 60 minutes most days   Progress Towards Goal(s):  In progress.  Handouts given during visit include:  Meal card   Nutritional Diagnosis:  [place]-3.3 Overweight/obesity related to past poor dietary habits and physical inactivity as evidenced by patient w/ recent sleeve gastrectomy surgery following dietary guidelines for continued weight loss.  Intervention:  Nutrition education/reinforcement Goals:  Follow Phase 3B: High Protein + Non-Starchy Vegetables  Eat 3-6 small meals/snacks, every 3-5 hrs  Increase lean protein foods to meet 60g goal  Start taking calcium and Vitamin B12 again  Increase fluid intake to 64oz +  Avoid drinking 15 minutes before, during and 30 minutes after eating  Aim for >30 min of physical activity daily  Get back to taking calcium citrate, and Vitamin B12  Include fruit before workout (watch the portions!)  Test blood sugar 1 hour before working out. If it's below 100, have protein and 15 g carbs (1/2 banana, 6 crackers, Gerber cookies)   Test blood sugar after exercise, make sure it's above 70     Teaching Method Utilized:  Visual Auditory Hands on  Barriers to learning/adherence to lifestyle change:  none  Demonstrated degree of understanding via:  Teach Back   Monitoring/Evaluation:  Dietary intake, exercise, and body weight. Follow up in 3 Months for 10 month post-op visit.

## 2015-11-02 NOTE — Patient Instructions (Addendum)
Goals:  Follow Phase 3B: High Protein + Non-Starchy Vegetables  Eat 3-6 small meals/snacks, every 3-5 hrs  Increase lean protein foods to meet 60g goal  Start taking calcium and Vitamin B12 again  Increase fluid intake to 64oz +  Avoid drinking 15 minutes before, during and 30 minutes after eating  Aim for >30 min of physical activity daily  Get back to taking calcium citrate, and Vitamin B12  Include fruit before workout (watch the portions!)  Test blood sugar 1 hour before working out. If it's below 100, have protein and 15 g carbs (1/2 banana, 6 crackers, Gerber cookies)   Test blood sugar after exercise, make sure it's above 70     Surgery date: 03/29/2015 Surgery type: Sleeve Gastrectomy Start weight at [facility]: 352.5 lbs on 11/04/14 Weight today: 231.0  lbs  Weight change: 20 lbs Total weight loss: 121.5 lbs Weight loss goal: 140-180 lbs  TANITA  BODY COMP RESULTS  02/14/15 04/12/15 06/14/15 07/14/15 08/31/15 11/02/15   BMI (kg/m^2) 66.3 61.1 53.6 51.5 47.5 43.6   Fat Mass (lbs) 188 185.5 141.0 140.5 123 100.5   Fat Free Mass (lbs) 163 138.0 142.5 132.0 128 130.5   Total Body Water (lbs) 119.5 101.0 104.5 96.5 93.5 95.5

## 2015-11-09 ENCOUNTER — Other Ambulatory Visit: Payer: Self-pay | Admitting: Obstetrics and Gynecology

## 2015-11-09 DIAGNOSIS — Z124 Encounter for screening for malignant neoplasm of cervix: Secondary | ICD-10-CM | POA: Diagnosis not present

## 2015-11-09 DIAGNOSIS — Z01419 Encounter for gynecological examination (general) (routine) without abnormal findings: Secondary | ICD-10-CM | POA: Diagnosis not present

## 2015-11-09 DIAGNOSIS — Z6841 Body Mass Index (BMI) 40.0 and over, adult: Secondary | ICD-10-CM | POA: Diagnosis not present

## 2015-11-10 LAB — CYTOLOGY - PAP

## 2015-12-22 ENCOUNTER — Ambulatory Visit: Payer: 59 | Admitting: Cardiology

## 2016-01-10 ENCOUNTER — Other Ambulatory Visit: Payer: Self-pay | Admitting: Obstetrics and Gynecology

## 2016-01-10 DIAGNOSIS — N898 Other specified noninflammatory disorders of vagina: Secondary | ICD-10-CM | POA: Diagnosis not present

## 2016-01-10 DIAGNOSIS — Z113 Encounter for screening for infections with a predominantly sexual mode of transmission: Secondary | ICD-10-CM | POA: Diagnosis not present

## 2016-01-20 DIAGNOSIS — N760 Acute vaginitis: Secondary | ICD-10-CM | POA: Diagnosis not present

## 2016-01-23 ENCOUNTER — Other Ambulatory Visit: Payer: Self-pay

## 2016-02-02 ENCOUNTER — Encounter: Payer: Self-pay | Admitting: Dietician

## 2016-02-02 ENCOUNTER — Encounter: Payer: 59 | Attending: General Surgery | Admitting: Dietician

## 2016-02-02 DIAGNOSIS — Z713 Dietary counseling and surveillance: Secondary | ICD-10-CM | POA: Insufficient documentation

## 2016-02-02 NOTE — Patient Instructions (Addendum)
Goals:  Follow Phase 3B: High Protein + Non-Starchy Vegetables  Eat 3-6 small meals/snacks, every 3-5 hrs  Increase lean protein foods to meet 60g goal  Increase fluid intake to 64oz +  Avoid drinking 15 minutes before, during and 30 minutes after eating  Aim for >30 min of physical activity daily  Get back to taking calcium citrate Celebrate or Bariatric Advantage (online), and Vitamin B12 500 mcg daily regularly  Talk to Dr. Redmond Pulling about constipation  Make sure to have plenty of fluid during long workout and think about adding carbs if you are doing a an extended work out.   Surgery date: 03/29/2015 Surgery type: Sleeve Gastrectomy Start weight at [facility] 352.5 lbs on 11/04/14 Weight today: 207.8  Lbs  Weight change: 23.2 lbs Total weight loss: 144.7 lbs Weight loss goal: 140-180 lbs  TANITA  BODY COMP RESULTS  02/14/15 04/12/15 06/14/15 07/14/15 08/31/15 11/02/15 02/02/16   BMI (kg/m^2) 66.3 61.1 53.6 51.5 47.5 43.6 39.3   Fat Mass (lbs) 188 185.5 141.0 140.5 123 100.5 88.6   Fat Free Mass (lbs) 163 138.0 142.5 132.0 128 130.5 119.2   Total Body Water (lbs) 119.5 101.0 104.5 96.5 93.5 95.5 86.6

## 2016-02-02 NOTE — Progress Notes (Signed)
  Follow-up visit:  10 Months Post-Operative Sleeve Gastrectomy Surgery  Medical Nutrition Therapy:  Appt start time: [id number] end time: 915  Primary concerns today: Post-operative Bariatric Surgery Nutrition Management. Returns with a 23.2 lbs weight loss. Hired a trainer and working out 5-7 x week. Lifts 3 x week. Tries to keep carbs under 30 grams. Felt like she might not be getting in enough water. Feels like blood sugar is not going low anymore.   Works 3rd shift. Feeling good overall.   Goal: BMI not in the obese category  Surgery date: 03/29/2015 Surgery type: Sleeve Gastrectomy Start weight at [facility]: 352.5 lbs on 11/04/14 Weight today: 207.8  Lbs  Weight change: 23.2 lbs Total weight loss: 144.7 lbs Weight loss goal: 140-180 lbs  TANITA  BODY COMP RESULTS  02/14/15 04/12/15 06/14/15 07/14/15 08/31/15 11/02/15 02/02/16   BMI (kg/m^2) 66.3 61.1 53.6 51.5 47.5 43.6 39.3   Fat Mass (lbs) 188 185.5 141.0 140.5 123 100.5 88.6   Fat Free Mass (lbs) 163 138.0 142.5 132.0 128 130.5 119.2   Total Body Water (lbs) 119.5 101.0 104.5 96.5 93.5 95.5 86.6    Preferred Learning Style:   No preference indicated   Learning Readiness:   Ready  24-hr recall: B (10 AM): Premier Protein shake (30g) Snk (AM): [place] yogurt (9-12 g) sometimes with added crunchies if doing more exercise or chips Snk (PM): Premier Protein shake (30g) L (6 PM): scrambled egg OR salad with boiled egg, tuna, and shrimp or stir fries (7-14g) Snk (PM): veggie burger or salad   Fluid intake: 22 oz protein shake, 48-60 oz water and regular cranberry juice sometimes Estimated total protein intake: 60+ grams  Medications: see list Supplementation: taking MVI, not taking calcium or B12, forgets to take them ("better at remembering to take them")  CBG monitoring: not testing Average CBG per patient: N/A Last patient reported A1c: 5.4% in September   Using straws: No  Drinking while eating: No Hair loss: No Carbonated  beverages: has tried carbonated water and "it hurt" N/V/D/C: had nausea last week and not sure why, has constipation and not sure what to do  Dumping syndrome: No  Recent physical activity:  [employer] - Zumba 7 x week, treadmill and elliptical 60 minutes most days   Progress Towards Goal(s):  In progress.  Handouts given during visit include:  Meal card   Nutritional Diagnosis:  [place]-3.3 Overweight/obesity related to past poor dietary habits and physical inactivity as evidenced by patient w/ recent sleeve gastrectomy surgery following dietary guidelines for continued weight loss.  Intervention:  Nutrition education/reinforcement Goals:  Follow Phase 3B: High Protein + Non-Starchy Vegetables  Eat 3-6 small meals/snacks, every 3-5 hrs  Increase lean protein foods to meet 60g goal  Increase fluid intake to 64oz +  Avoid drinking 15 minutes before, during and 30 minutes after eating  Aim for >30 min of physical activity daily  Get back to taking calcium citrate Celebrate or Bariatric Advantage (online), and Vitamin B12 500 mcg daily regularly  Talk to Dr. Redmond Pulling about constipation  Make sure to have plenty of fluid during long workout and think about adding carbs if you are doing a an extended work out.    Teaching Method Utilized:  Visual Auditory Hands on  Barriers to learning/adherence to lifestyle change: none  Demonstrated degree of understanding via:  Teach Back   Monitoring/Evaluation:  Dietary intake, exercise, and body weight. Follow up in 3 Months for 13 month post-op visit.

## 2016-03-09 ENCOUNTER — Ambulatory Visit (INDEPENDENT_AMBULATORY_CARE_PROVIDER_SITE_OTHER): Payer: 59 | Admitting: Physician Assistant

## 2016-03-09 VITALS — BP 118/80 | HR 96 | Temp 98.4°F | Resp 18 | Ht 61.0 in | Wt 202.0 lb

## 2016-03-09 DIAGNOSIS — M25562 Pain in left knee: Secondary | ICD-10-CM

## 2016-03-09 DIAGNOSIS — E669 Obesity, unspecified: Secondary | ICD-10-CM | POA: Insufficient documentation

## 2016-03-09 MED ORDER — MELOXICAM 15 MG PO TABS: 15.0000 mg | ORAL_TABLET | Freq: Every day | ORAL | 0 refills | Status: DC

## 2016-03-09 NOTE — Patient Instructions (Addendum)
Follow up in 2 weeks in no improvement.  Come sooner if symptoms worsen or change.  Ice 5-6 times a day for 20 minutes at a time We will call you to see if we can give you Mobic for inflammation.  Pes Anserinus Syndrome With Rehab The pes anserine, also known as the goose's foot, is an area of the shinbone (tibia) near the knee joint where the tendons of three of the muscles of the thigh insert into the bone. These muscles are important for bending the knee and bringing the leg across the body. Just underneath the three tendons that attach at the pes anserinus exists a fluid filled sac (bursa) that is meant to reduce the friction between the tendons and the tibia. Pes anserinus syndrome is a condition that is characterized by inflammation of the bursa (bursitis) and/ or tendonitis (inflammation of the tendon) and may cause severe pain in the lower portion of the inner (medial) side of the knee. SYMPTOMS   Pain and inflammation over the lower portion of the medial side of the knee.  Pain that worsens as the duration of an activity increases.  Pain that worsens when bending the knee, especially against resistance.  A crackling sound (crepitation) when the tendon or bursa is moved or touched. CAUSES  Bursitis and tendonitis are usually characterized as overuse injuries. Common mechanisms of injury include:  Stress placed on the knee from a sudden increase in the intensity, frequency, or duration of training.  Direct trauma to the upper leg (less common). RISK INCREASES WITH:  Endurance sports (distance running or triathletes).  Making changes to or beginning a new training program.  Sports that place stress on the muscles that insert at the pes anserinus, such as those that require pivoting, cutting, or jumping.  Improper training.  Poor strength and flexibility  Failure to warm-up properly before activity.  Improper knee alignment ( knock knees).  Arthritis of the  knee. PREVENTION  Warm up and stretch properly before activity.  Allow for adequate recovery between workouts.  Maintain physical fitness:  Strength, flexibility, and endurance.  Cardiovascular fitness.  Learn and use training methods that will reduce the stress placed on the pes anserinus.  Arch supports (orthotics) may be helpful for those with flat feet. PROGNOSIS  If treated properly, then the symptoms of pes anserinus syndrome usually resolve within 6 weeks.  RELATED COMPLICATIONS   Persistent and potentially chronic pain if the condition is not treated properly.  Re-injury if activity is resumed before the injury is allowed to heal completely, or if one resumes improper training habits. TREATMENT Treatment initially involves the use of ice and medication to help reduce pain and inflammation. The use of strengthening and stretching exercises may help reduce pain with activity. These exercises may be performed at home or with a therapist. Individuals who have flat feet may find benefit in wearing arch supports in their shoes. Some individuals find that compression bandages or knee sleeves help reduce symptoms. Your caregiver may recommend a corticosteroid injection to help reduce inflammation. If symptoms persist, despite conservative treatment for greater than 6 months, then surgery may be recommended.  MEDICATION   If pain medication is necessary, then nonsteroidal anti-inflammatory medications, such as aspirin and ibuprofen, or other minor pain relievers, such as acetaminophen, are often recommended.  Do not take pain medication for 7 days before surgery.  Prescription pain relievers may be given if deemed necessary by your caregiver. Use only as directed and only as much  as you need.  Corticosteroid injections may be given by your caregiver. These injections should be reserved for the most serious cases, because they may only be given a certain number of times. SEEK MEDICAL  CARE IF:  Treatment seems to offer no benefit, or the condition worsens.  Any medications produce adverse side effects. EXERCISES  RANGE OF MOTION (ROM) AND STRETCHING EXERCISES - Pes Anserinus Syndrome These exercises may help you when beginning to rehabilitate your injury. Your symptoms may resolve with or without further involvement from your physician, physical therapist or athletic trainer. While completing these exercises, remember:   Restoring tissue flexibility helps normal motion to return to the joints. This allows healthier, less painful movement and activity.  An effective stretch should be held for at least 30 seconds.  A stretch should never be painful. You should only feel a gentle lengthening or release in the stretched tissue. STRETCH - Hamstrings, Supine  Lie on your back. Loop a belt or towel over the ball of your right / left foot.  Straighten your right / left knee and slowly pull on the belt to raise your leg. Do not allow the right / left knee to bend. Keep your opposite leg flat on the floor.  Raise the leg until you feel a gentle stretch behind your right / left knee or thigh. Hold this position for __________ seconds. Repeat __________ times. Complete this stretch __________ times per day.  STRETCH - Hamstrings, Doorway  Lie on your back with your right / left leg extended and resting on the wall and the opposite leg flat on the ground through the door. Initially, position your bottom farther away from the wall than the illustration shows.  Keep your right / left knee straight. If you feel a stretch behind your knee or thigh, hold this position for __________ seconds.  If you do not feel a stretch, scoot your bottom closer to the door, and hold __________ seconds. Repeat __________ times. Complete this stretch __________ times per day.  STRETCH - Hamstrings/Adductors, V-Sit  Sit on the floor with your legs extended in a large "V," keeping your knees  straight.  With your head and chest upright, bend at your waist reaching for your right foot to stretch your left adductors.  You should feel a stretch in your left inner thigh. Hold for __________ seconds.  Return to the upright position to relax your leg muscles.  Continuing to keep your chest upright, bend straight forward at your waist to stretch your hamstrings.  You should feel a stretch behind both of your thighs and/or knees. Hold for __________ seconds.  Return to the upright position to relax your leg muscles.  Repeat steps 2 through 4. Repeat __________ times. Complete this exercise __________ times per day.  STRETCH - Hamstrings, Standing  Stand or sit and extend your right / left leg, placing your foot on a chair or foot stool  Keeping a slight arch in your low back and your hips straight forward.  Lead with your chest and lean forward at the waist until you feel a gentle stretch in the back of your right / left knee or thigh. (When done correctly, this exercise requires leaning only a small distance.)  Hold this position for __________ seconds. Repeat __________ times. Complete this stretch __________ times per day. STRETCH - Adductors, Lunge  While standing, spread your legs  Lean away from your right / left leg by bending your opposite knee. You may rest your  hands on your thigh for balance.  You should feel a stretch in your right / left inner thigh. Hold for __________ seconds. Repeat __________ times. Complete this exercise __________ times per day.  STRETCH - Adductors, Standing  Place your right / left foot on a counter or stable table. Turn away from your leg so both hips line up with your right / left leg.  Keeping your hips facing forward, slowly bend your opposite leg until you feel a gentle stretch on the inside of your right / left thigh.  Hold for __________ seconds. Repeat __________ times. Complete this exercise __________ times per day.   STRENGTHENING EXERCISES - Pes Anserinus Syndrome  These exercises may help you when beginning to rehabilitate your injury. They may resolve your symptoms with or without further involvement from your physician, physical therapist or athletic trainer. While completing these exercises, remember:   Muscles can gain both the endurance and the strength needed for everyday activities through controlled exercises.  Complete these exercises as instructed by your physician, physical therapist or athletic trainer. Progress the resistance and repetitions only as guided. STRENGTH - Hamstring, Curls  Lay on your stomach with your legs extended. (If you lay on a bed, your feet may hang over the edge.)  Tighten the muscles in the back of your thigh to bend your right / left knee up to 90 degrees. Keep your hips flat on the bed/floor.  Hold this position for __________ seconds.  Slowly lower your leg back to the starting position. Repeat __________ times. Complete this exercise __________ times per day.  OPTIONAL ANKLE WEIGHTS: Begin with ____________________, but DO NOT exceed ____________________. Increase in 1 lb/0.5 kg increments.  STRENGTH - Hip Adductors, Straight Leg Raises  Lie on your side so that your head, shoulders, knee and hip line up. You may place your upper foot in front to help maintain your balance. Your right / left leg should be on the bottom.  Roll your hips slightly forward, so that your hips are stacked directly over each other and your right / left knee is facing forward.  Tense the muscles in your inner thigh and lift your bottom leg 4-6 inches. Hold this position for __________ seconds.  Slowly lower your leg to the starting position. Allow the muscles to fully relax before beginning the next repetition. Repeat __________ times. Complete this exercise __________ times per day.    This information is not intended to replace advice given to you by your health care provider.  Make sure you discuss any questions you have with your health care provider.   Document Released: 07/23/2005 Document Revised: 12/07/2014 Document Reviewed: 11/04/2008 Elsevier Interactive Patient Education 2016 [employer].   IF you received an x-ray today, you will receive an invoice from [facility]. Please contact [facility] at [phone] with questions or concerns regarding your invoice.   IF you received labwork today, you will receive an invoice from [employer]. Please contact [facility] at [phone] with questions or concerns regarding your invoice.   Our billing staff will not be able to assist you with questions regarding bills from these companies.  You will be contacted with the lab results as soon as they are available. The fastest way to get your results is to activate your My Chart account. Instructions are located on the last page of this paperwork. If you have not heard from [place] regarding the results in 2 weeks, please contact this office.

## 2016-03-09 NOTE — Progress Notes (Signed)
Lorraine Chambers  MRN: [id number] DOB: Feb 14, 1986  Subjective:  Lorraine Chambers is a 30 y.o. female seen in office today for a chief complaint of left anteromedial knee pain x 4 days. Has associated minimal swelling and worsening of pain with movements that require pt to bend knee.  Denies warmth, direct injury trauma to the knee, popping/clicking sensation, and unable to bear weight. Pt has been working out a lot since recovery from gastrectomy sleeve in 05/2015. Currently going to zumba class 1-4 times a day on most days of the week. Reports doing a lot of jumping, squatting, and mountain climbs during these classes. Does not stretch before or after class. Pt has tried ibuprofen for the pain and did have relief but was concerned because she is s/p gastrectomy and was told not to use NSAIDS.   Has a history of osteoarthritis in left knee and reports always being "bow legged."   Review of Systems  All other systems reviewed and are negative.   Patient Active Problem List   Diagnosis Date Noted  . Obesity 03/09/2016  . Gastric outlet obstruction 05/21/2015  . Fatty liver disease, nonalcoholic 0000000  . S/P laparoscopic sleeve gastrectomy 03/29/2015  . Hypothyroidism 08/23/2014  . DM (diabetes mellitus), type 2 ([place]) 08/23/2014  . Hydradenitis 02/09/2014  . Iron deficiency anemia 02/09/2014  . Reactive thrombocytosis 02/09/2014  . Immune to hepatitis B 09/24/2013  . Positive hepatitis C antibody test 09/24/2013  . Hepatitis C antibody test positive 09/24/2013  . HERPES SIMPLEX INFECTION, TYPE I 07/07/2008    Current Outpatient Prescriptions on File Prior to Visit  Medication Sig Dispense Refill  . HYDROcodone-acetaminophen (NORCO) 5-325 MG tablet Take 1 tablet by mouth every 8 (eight) hours as needed for moderate pain. NO extra tylenol with this, take with stool softener if needed (Patient not taking: Reported on 03/09/2016) 20 tablet 0   No current  facility-administered medications on file prior to visit.     Allergies  Allergen Reactions  . Bee Venom Anaphylaxis    Objective:  BP 118/80   Pulse 96   Temp 98.4 F (36.9 C) (Oral)   Resp 18   Ht 5\' 1"  (1.549 m)   Wt 202 lb (91.6 kg)   LMP 03/08/2016   SpO2 100%   BMI 38.17 kg/m   Physical Exam  Constitutional: She is oriented to person, place, and time and well-developed, well-nourished, and in no distress.  HENT:  Head: Normocephalic and atraumatic.  Eyes: Conjunctivae are normal.  Neck: Normal range of motion.  Pulmonary/Chest: Effort normal.  Musculoskeletal: Normal range of motion.       Right knee: Normal.       Left knee: She exhibits no effusion, no ecchymosis and no erythema. Swelling: hard to visualize due to body habitus. Tenderness (at anteromedial proximal tibia ) found.  Neurological: She is alert and oriented to person, place, and time. Gait normal.  Skin: Skin is warm and dry.  Psychiatric: Affect normal.  Vitals reviewed.   Assessment and Plan :   1. Obesity 2. Knee pain, acute, left -History and physical exam consistent with pes ansirene bursitis  -Pt instructed to rest, ice, begin stretching, avoid excessive working out in the future to prevent further overuse injury -[facility] contacted due to question about NSAID use with s/p gastrectomy sleeve. Informed that NSAID use is fine as long as the duration is no longer than 2 weeks. Prescribed meloxicam (MOBIC) 15 MG tablet; Take 1 tablet (15  mg total) by mouth daily.  Dispense: 14 tablet; Refill: 0 -Follow up in 2 weeks if no improvement for possible referral to PT  Tenna Delaine PA-C  [facility] 03/09/2016 8:38 AM

## 2016-04-13 DIAGNOSIS — Z9884 Bariatric surgery status: Secondary | ICD-10-CM | POA: Diagnosis not present

## 2016-04-13 DIAGNOSIS — Z0001 Encounter for general adult medical examination with abnormal findings: Secondary | ICD-10-CM | POA: Diagnosis not present

## 2016-04-13 DIAGNOSIS — L919 Hypertrophic disorder of the skin, unspecified: Secondary | ICD-10-CM | POA: Diagnosis not present

## 2016-04-13 DIAGNOSIS — E119 Type 2 diabetes mellitus without complications: Secondary | ICD-10-CM | POA: Diagnosis not present

## 2016-04-18 DIAGNOSIS — E669 Obesity, unspecified: Secondary | ICD-10-CM | POA: Diagnosis not present

## 2016-04-18 DIAGNOSIS — E559 Vitamin D deficiency, unspecified: Secondary | ICD-10-CM | POA: Diagnosis not present

## 2016-04-18 DIAGNOSIS — Z9884 Bariatric surgery status: Secondary | ICD-10-CM | POA: Diagnosis not present

## 2016-04-18 DIAGNOSIS — E119 Type 2 diabetes mellitus without complications: Secondary | ICD-10-CM | POA: Diagnosis not present

## 2016-04-18 DIAGNOSIS — I10 Essential (primary) hypertension: Secondary | ICD-10-CM | POA: Diagnosis not present

## 2016-04-18 DIAGNOSIS — H5213 Myopia, bilateral: Secondary | ICD-10-CM | POA: Diagnosis not present

## 2016-05-10 ENCOUNTER — Encounter: Payer: 59 | Attending: General Surgery | Admitting: Dietician

## 2016-05-10 DIAGNOSIS — Z6837 Body mass index (BMI) 37.0-37.9, adult: Secondary | ICD-10-CM

## 2016-05-10 DIAGNOSIS — E6609 Other obesity due to excess calories: Secondary | ICD-10-CM

## 2016-05-10 DIAGNOSIS — Z713 Dietary counseling and surveillance: Secondary | ICD-10-CM | POA: Insufficient documentation

## 2016-05-10 NOTE — Patient Instructions (Addendum)
Goals:  Follow Phase 3B: High Protein + Non-Starchy Vegetables  Eat 3-6 small meals/snacks, every 3-5 hrs  Increase lean protein foods to meet 60g goal  Increase fluid intake to 64oz +  Avoid drinking 15 minutes before, during and 30 minutes after eating  Aim for >30 min of physical activity daily  Continue to have plenty of fluid during long workout  Try to have fruit or sweet potato with meal with protein  Consider talking to a therapist     Surgery date: 03/29/2015 Surgery type: Sleeve Gastrectomy Start weight at [facility]: 352.5 lbs on 11/04/14 Weight today: 195.8 lbs  Weight change: 12 lbs Total weight loss: 156.7 lbs Weight loss goal: 140-180 lbs  TANITA  BODY COMP RESULTS  02/14/15 04/12/15 06/14/15 07/14/15 08/31/15 11/02/15 02/02/16 05/10/16   BMI (kg/m^2) 66.3 61.1 53.6 51.5 47.5 43.6 39.3 37.0   Fat Mass (lbs) 188 185.5 141.0 140.5 123 100.5 88.6 71.0   Fat Free Mass (lbs) 163 138.0 142.5 132.0 128 130.5 119.2 124.8   Total Body Water (lbs) 119.5 101.0 104.5 96.5 93.5 95.5 86.6 90.2

## 2016-05-10 NOTE — Progress Notes (Signed)
Follow-up visit:  13 Months Post-Operative Sleeve Gastrectomy Surgery  Medical Nutrition Therapy:  Appt start time: 515 end time: 555  Primary concerns today: Post-operative Bariatric Surgery Nutrition Management. Returns with a 12 lbs weight loss. Has thrown up a couple of times recently went she overate some foods or after doing ab work out.   Hired a trainer and working out 5-7 x week. Lifts 3 x week.Will work out spaced out 3-4 x per day on days off.  Has some "cheat" foods like donut sticks and oatmeal cream pie or cheetos (crunchy food). Doesn't eat sweets much but will have some cheetos pretty often. Starting having them less often since it was becoming a more habit.   Tries to keep carbs under 30 grams. Increasing carbs on workout days. Discussed talking with a therapist about becoming hyper-focused on numbers and weight loss.  Works 3rd shift.  Goal: BMI not in the obese category  Surgery date: 03/29/2015 Surgery type: Sleeve Gastrectomy Start weight at [facility]: 352.5 lbs on 11/04/14 Weight today: 195.8 lbs  Weight change: 12 lbs, 17 lbs fat mass loss Total weight loss: 156.7 lbs Weight loss goal: 140-180 lbs  TANITA  BODY COMP RESULTS  02/14/15 04/12/15 06/14/15 07/14/15 08/31/15 11/02/15 02/02/16 05/10/16   BMI (kg/m^2) 66.3 61.1 53.6 51.5 47.5 43.6 39.3 37.0   Fat Mass (lbs) 188 185.5 141.0 140.5 123 100.5 88.6 71.0   Fat Free Mass (lbs) 163 138.0 142.5 132.0 128 130.5 119.2 124.8   Total Body Water (lbs) 119.5 101.0 104.5 96.5 93.5 95.5 86.6 90.2   Preferred Learning Style:   No preference indicated   Learning Readiness:   Ready  24-hr recall: B (10 AM): Premier Protein shake (30g) Snk (AM): [place] yogurt (9-12 g) sometimes with added crunchies if doing more exercise or chips Snk (PM): Premier Protein shake (30g) L (6 PM): scrambled egg OR salad with boiled egg, tuna, and shrimp or stir fries (7-14g) Snk (PM): veggie burger or salad   Fluid intake: 22 oz protein shake,  48-60 oz water and crystal light cran lemonade Estimated total protein intake: 60+ grams  Medications: see list Supplementation: taking MVI, not taking calcium or B12, doing better  CBG monitoring: not testing Average CBG per patient: N/A Last patient reported A1c: 4.3% in September 2017  Using straws: Not usually  Drinking while eating: tried some recently with peanut butter, tries to wait 30 minutes to drink Hair loss: No Carbonated beverages: sparkling water, lets get flat every once in a while N/V/D/C: had nausea last week and not sure why, constipation has gotten better Dumping syndrome: No  Recent physical activity:  [employer] - Zumba 7 x week, treadmill and elliptical 60 minutes most days, 3-4 x per DAY on days off   Progress Towards Goal(s):  In progress.  Handouts given during visit include:  Meal card  Therapists    Nutritional Diagnosis:  [place]-3.3 Overweight/obesity related to past poor dietary habits and physical inactivity as evidenced by patient w/ recent sleeve gastrectomy surgery following dietary guidelines for continued weight loss.  Intervention:  Nutrition education/reinforcement Goals:  Follow Phase 3B: High Protein + Non-Starchy Vegetables  Eat 3-6 small meals/snacks, every 3-5 hrs  Increase lean protein foods to meet 60g goal  Increase fluid intake to 64oz +  Avoid drinking 15 minutes before, during and 30 minutes after eating  Aim for >30 min of physical activity daily  Continue to have plenty of fluid during long workout  Try to have  fruit or sweet potato with meal with protein  Consider talking to a therapist      Teaching Method Utilized:  Visual Auditory Hands on  Barriers to learning/adherence to lifestyle change: none  Demonstrated degree of understanding via:  Teach Back   Monitoring/Evaluation:  Dietary intake, exercise, and body weight. Follow up in 3 Months for 16 month post-op visit.

## 2016-06-20 DIAGNOSIS — N909 Noninflammatory disorder of vulva and perineum, unspecified: Secondary | ICD-10-CM | POA: Diagnosis not present

## 2016-08-05 ENCOUNTER — Telehealth: Payer: 59 | Admitting: Nurse Practitioner

## 2016-08-05 DIAGNOSIS — R197 Diarrhea, unspecified: Secondary | ICD-10-CM | POA: Diagnosis not present

## 2016-08-05 MED ORDER — ONDANSETRON 4 MG PO TBDP: 4.0000 mg | ORAL_TABLET | Freq: Three times a day (TID) | ORAL | 0 refills | Status: DC | PRN

## 2016-08-05 MED ORDER — PROMETHAZINE HCL 12.5 MG PO TABS: 12.5000 mg | ORAL_TABLET | Freq: Three times a day (TID) | ORAL | 0 refills | Status: DC | PRN

## 2016-08-05 NOTE — Progress Notes (Signed)
We are sorry that you are not feeling well.  Here is how we plan to help!  Based on what you have shared with me it looks like you have Acute Infectious Diarrhea.  Most cases of acute diarrhea are due to infections with virus and bacteria and are self-limited conditions lasting less than 14 days.  For your symptoms you may take Imodium 2 mg tablets that are over the counter at your local pharmacy. Take two tablet now and then one after each loose stool up to 6 a day.  Antibiotics are not needed for most people with diarrhea.  Optional: Zofran 4 mg 1 tablet every 8 hours as needed for nausea and vomiting    HOME CARE  We recommend changing your diet to help with your symptoms for the next few days.  Drink plenty of fluids that contain water salt and sugar. Sports drinks such as Gatorade may help.   You may try broths, soups, bananas, applesauce, soft breads, mashed potatoes or crackers.   You are considered infectious for as long as the diarrhea continues. Hand washing or use of alcohol based hand sanitizers is recommend.  It is best to stay out of work or school until your symptoms stop.   GET HELP RIGHT AWAY  If you have dark yellow colored urine or do not pass urine frequently you should drink more fluids.    If your symptoms worsen   If you feel like you are going to pass out (faint)  You have a new problem  MAKE SURE YOU   Understand these instructions.  Will watch your condition.  Will get help right away if you are not doing well or get worse.  Your e-visit answers were reviewed by a board certified advanced clinical practitioner to complete your personal care plan.  Depending on the condition, your plan could have included both over the counter or prescription medications.  If there is a problem please reply  once you have received a response from your provider.  Your safety is important to us.  If you have drug allergies check your prescription carefully.     You can use MyChart to ask questions about today's visit, request a non-urgent call back, or ask for a work or school excuse for 24 hours related to this e-Visit. If it has been greater than 24 hours you will need to follow up with your provider, or enter a new e-Visit to address those concerns.   You will get an e-mail in the next two days asking about your experience.  I hope that your e-visit has been valuable and will speed your recovery. Thank you for using e-visits.   

## 2016-08-05 NOTE — Addendum Note (Signed)
Addended by: Chevis Pretty on: 08/05/2016 03:01 PM   Modules accepted: Orders

## 2016-08-21 ENCOUNTER — Encounter: Payer: Self-pay | Admitting: Dietician

## 2016-08-21 ENCOUNTER — Encounter: Payer: 59 | Attending: General Surgery | Admitting: Dietician

## 2016-08-21 DIAGNOSIS — E6609 Other obesity due to excess calories: Secondary | ICD-10-CM

## 2016-08-21 DIAGNOSIS — Z6837 Body mass index (BMI) 37.0-37.9, adult: Secondary | ICD-10-CM

## 2016-08-21 DIAGNOSIS — Z713 Dietary counseling and surveillance: Secondary | ICD-10-CM | POA: Insufficient documentation

## 2016-08-21 NOTE — Patient Instructions (Addendum)
Enjoy what weight loss brought you: -Able to walk upstairs and not get tired -Ziplining -More options for clothes -Trampoline park -Diabetes in remission   Goals: -Skip a zumba class to go to a bariatric support group -Aim for at least 100 carbs per day: sweet potatoes, yogurt, any kind of fruit -Make an appointment to see a therapist today! -Work on meeting 64-oz fluid goal -Start taking your vitamins again -Listen to your body's signals  -Take an active recovery day -Do something fun that does not involve food or exercise  -Mani/pedi, "wine" and design, pottery painting, [place] [employer]  TANITA  BODY COMP RESULTS  02/14/15 04/12/15 06/14/15 07/14/15 08/31/15 11/02/15 02/02/16 05/10/16 08/21/16   BMI (kg/m^2) 66.3 61.1 53.6 51.5 47.5 43.6 39.3 37.0 36.2   Fat Mass (lbs) 188 185.5 141.0 140.5 123 100.5 88.6 71.0 73.2   Fat Free Mass (lbs) 163 138.0 142.5 132.0 128 130.5 119.2 124.8 118.6   Total Body Water (lbs) 119.5 101.0 104.5 96.5 93.5 95.5 86.6 90.2 85.4

## 2016-08-21 NOTE — Progress Notes (Signed)
Follow-up visit:  1.5 years Post-Operative Sleeve Gastrectomy Surgery  Medical Nutrition Therapy:  Appt start time: [id number] end time: 1205  Primary concerns today: Post-operative Bariatric Surgery Nutrition Management.  Noga returns having lost another 4 pounds. She is disappointed that she has not lost more and would still like to lose another 30 pounds. She admits that she has become obsessed with exercise and the number on the scale. She states she has panic attacks when she has to skip a workout. Usually exercises 3 times a day. Got nauseated and dizzy before and during her workout this morning. She usually drinks C4 preworkout before her workout and then sits in the sauna. Admits that she does not drink nearly enough water. She states that she weighed herself 25 times this morning. Jela agrees that she needs to see a therapist. Not taking vitamins.   Surgery date: 03/29/2015 Surgery type: Sleeve Gastrectomy Start weight at [facility]: 352.5 lbs on 11/04/14 Weight today: 191.8 lbs Weight change: 4 lbs Total weight loss: 160.7 lbs Weight loss goal: 160 lbs  TANITA  BODY COMP RESULTS  02/14/15 04/12/15 06/14/15 07/14/15 08/31/15 11/02/15 02/02/16 05/10/16 08/21/16   BMI (kg/m^2) 66.3 61.1 53.6 51.5 47.5 43.6 39.3 37.0 36.2   Fat Mass (lbs) 188 185.5 141.0 140.5 123 100.5 88.6 71.0 73.2   Fat Free Mass (lbs) 163 138.0 142.5 132.0 128 130.5 119.2 124.8 118.6   Total Body Water (lbs) 119.5 101.0 104.5 96.5 93.5 95.5 86.6 90.2 85.4   Preferred Learning Style:   No preference indicated   Learning Readiness:   Ready  24-hr recall: B (10 AM): Premier Protein shake (30g) Snk (AM): [place] yogurt (9-12 g) sometimes with added crunchies if doing more exercise or chips Snk (PM): Premier Protein shake (30g) L (6 PM): scrambled egg OR salad with boiled egg, tuna, and shrimp or stir fries (7-14g) Snk (PM): veggie burger or salad   Fluid intake: 22 oz protein shake, 48-60 oz water and crystal light cran  lemonade Estimated total protein intake: 60+ grams  Medications: see list Supplementation: taking MVI, not taking calcium or B12, doing better  CBG monitoring: not testing Average CBG per patient: N/A Last patient reported A1c: 4.3% in September 2017  Using straws: Not usually  Drinking while eating: tried some recently with peanut butter, tries to wait 30 minutes to drink Hair loss: No Carbonated beverages: sparkling water, lets get flat every once in a while N/V/D/C: had nausea last week and not sure why, constipation has gotten better Dumping syndrome: No  Recent physical activity:  [employer] - Zumba 7 x week, treadmill and elliptical 60 minutes most days, 3-4 x per DAY on days off   Progress Towards Goal(s):  In progress.  Handouts given during visit include:  none   Nutritional Diagnosis:  [place]-3.3 Overweight/obesity related to past poor dietary habits and physical inactivity as evidenced by patient w/ recent sleeve gastrectomy surgery following dietary guidelines for continued weight loss.  Intervention:  Nutrition education/reinforcement. Encouraged patient to see a therapist as soon as possible. Explained benefit of rest days and carbohydrates for muscle recovery.   Goals: -Skip a zumba class to go to a bariatric support group -Aim for at least 100 carbs per day: sweet potatoes, yogurt, any kind of fruit -Make an appointment to see a therapist today! -Work on meeting 64-oz fluid goal -Start taking your vitamins again -Listen to your body's signals  -Take an active recovery day -Do something fun that does not involve food  or exercise  -Mani/pedi, "wine" and design, Research officer, political party, [place] [employer]  Teaching Method Utilized:  Ship broker Hands on  Barriers to learning/adherence to lifestyle change: none  Demonstrated degree of understanding via:  Teach Back   Monitoring/Evaluation:  Dietary intake, exercise, and body weight. Follow up in 2 months.

## 2016-08-22 ENCOUNTER — Ambulatory Visit: Payer: 59 | Admitting: Dietician

## 2016-09-10 ENCOUNTER — Encounter (HOSPITAL_COMMUNITY): Payer: Self-pay | Admitting: *Deleted

## 2016-09-10 ENCOUNTER — Emergency Department (HOSPITAL_COMMUNITY)
Admission: EM | Admit: 2016-09-10 | Discharge: 2016-09-11 | Disposition: A | Discharge status: Home / Self Care | Payer: 59 | Attending: Emergency Medicine | Admitting: Emergency Medicine

## 2016-09-10 ENCOUNTER — Emergency Department (HOSPITAL_COMMUNITY): Payer: 59

## 2016-09-10 DIAGNOSIS — E119 Type 2 diabetes mellitus without complications: Secondary | ICD-10-CM | POA: Insufficient documentation

## 2016-09-10 DIAGNOSIS — I10 Essential (primary) hypertension: Secondary | ICD-10-CM | POA: Diagnosis not present

## 2016-09-10 DIAGNOSIS — R791 Abnormal coagulation profile: Secondary | ICD-10-CM | POA: Diagnosis not present

## 2016-09-10 DIAGNOSIS — J111 Influenza due to unidentified influenza virus with other respiratory manifestations: Secondary | ICD-10-CM | POA: Diagnosis not present

## 2016-09-10 DIAGNOSIS — E039 Hypothyroidism, unspecified: Secondary | ICD-10-CM | POA: Insufficient documentation

## 2016-09-10 DIAGNOSIS — R509 Fever, unspecified: Secondary | ICD-10-CM | POA: Diagnosis present

## 2016-09-10 DIAGNOSIS — R69 Illness, unspecified: Secondary | ICD-10-CM

## 2016-09-10 DIAGNOSIS — R0602 Shortness of breath: Secondary | ICD-10-CM | POA: Diagnosis not present

## 2016-09-10 DIAGNOSIS — R05 Cough: Secondary | ICD-10-CM | POA: Diagnosis not present

## 2016-09-10 LAB — URINALYSIS, ROUTINE W REFLEX MICROSCOPIC
BILIRUBIN URINE: NEGATIVE
GLUCOSE, UA: NEGATIVE mg/dL
HGB URINE DIPSTICK: NEGATIVE
KETONES UR: NEGATIVE mg/dL
Leukocytes, UA: NEGATIVE
Nitrite: NEGATIVE
PH: 7 (ref 5.0–8.0)
Protein, ur: NEGATIVE mg/dL
Specific Gravity, Urine: 1.024 (ref 1.005–1.030)

## 2016-09-10 LAB — CBC WITH DIFFERENTIAL/PLATELET
BASOS PCT: 0 %
Basophils Absolute: 0 10*3/uL (ref 0.0–0.1)
EOS ABS: 0 10*3/uL (ref 0.0–0.7)
EOS PCT: 0 %
HCT: 40.4 % (ref 36.0–46.0)
Hemoglobin: 13.1 g/dL (ref 12.0–15.0)
LYMPHS ABS: 0.8 10*3/uL (ref 0.7–4.0)
Lymphocytes Relative: 11 %
MCH: 25.1 pg — AB (ref 26.0–34.0)
MCHC: 32.4 g/dL (ref 30.0–36.0)
MCV: 77.5 fL — ABNORMAL LOW (ref 78.0–100.0)
MONOS PCT: 11 %
Monocytes Absolute: 0.8 10*3/uL (ref 0.1–1.0)
Neutro Abs: 5.2 10*3/uL (ref 1.7–7.7)
Neutrophils Relative %: 78 %
PLATELETS: 246 10*3/uL (ref 150–400)
RBC: 5.21 MIL/uL — ABNORMAL HIGH (ref 3.87–5.11)
RDW: 14.4 % (ref 11.5–15.5)
WBC: 6.8 10*3/uL (ref 4.0–10.5)

## 2016-09-10 LAB — COMPREHENSIVE METABOLIC PANEL
ALK PHOS: 48 U/L (ref 38–126)
ALT: 22 U/L (ref 14–54)
AST: 24 U/L (ref 15–41)
Albumin: 4.3 g/dL (ref 3.5–5.0)
Anion gap: 9 (ref 5–15)
BILIRUBIN TOTAL: 1.2 mg/dL (ref 0.3–1.2)
BUN: 7 mg/dL (ref 6–20)
CALCIUM: 9.3 mg/dL (ref 8.9–10.3)
CO2: 23 mmol/L (ref 22–32)
CREATININE: 0.59 mg/dL (ref 0.44–1.00)
Chloride: 104 mmol/L (ref 101–111)
GFR calc non Af Amer: >60 mL/min (ref >60)
GLUCOSE: 95 mg/dL (ref 65–99)
Potassium: 4.2 mmol/L (ref 3.5–5.1)
SODIUM: 136 mmol/L (ref 135–145)
TOTAL PROTEIN: 7.7 g/dL (ref 6.5–8.1)

## 2016-09-10 LAB — PROTIME-INR
INR: 1.08
PROTHROMBIN TIME: 14 s (ref 11.4–15.2)

## 2016-09-10 LAB — I-STAT CG4 LACTIC ACID, ED
Lactic Acid, Venous: 1.07 mmol/L (ref 0.5–1.9)
Lactic Acid, Venous: 1.22 mmol/L (ref 0.5–1.9)

## 2016-09-10 MED ORDER — ACETAMINOPHEN 325 MG PO TABS
650.0000 mg | ORAL_TABLET | Freq: Once | ORAL | Status: AC
  Administered 2016-09-10: 650 mg via ORAL

## 2016-09-10 MED ORDER — IBUPROFEN 800 MG PO TABS
800.0000 mg | ORAL_TABLET | Freq: Once | ORAL | Status: AC
  Administered 2016-09-10: 800 mg via ORAL
  Filled 2016-09-10: qty 1

## 2016-09-10 MED ORDER — ACETAMINOPHEN 325 MG PO TABS
ORAL_TABLET | ORAL | Status: AC
  Filled 2016-09-10: qty 2

## 2016-09-10 MED ORDER — ONDANSETRON 4 MG PO TBDP
8.0000 mg | ORAL_TABLET | Freq: Once | ORAL | Status: AC
  Administered 2016-09-10: 8 mg via ORAL
  Filled 2016-09-10: qty 2

## 2016-09-10 MED ORDER — ONDANSETRON 4 MG PO TBDP: 4.0000 mg | ORAL_TABLET | Freq: Three times a day (TID) | ORAL | 0 refills | Status: DC | PRN

## 2016-09-10 NOTE — ED Provider Notes (Signed)
By signing my name below, I, Evelene Croon, attest that this documentation has been prepared under the direction and in the presence of Cowley, DO . Electronically Signed: Evelene Croon, Scribe. 09/10/2016. 11:52 PM.  TIME SEEN: 11:26 PM  CHIEF COMPLAINT:  Chief Complaint  Patient presents with  . Fever  . Cough     HPI:  HPI Comments:  Lorraine Chambers is a 31 y.o. female who presents to the Emergency Department complaining of a dry cough x 3 days. She states she took her temp today and it was 100 this afternoon. She reports associated nausea and back pain x 1 day from coughing, body aches and chills. Pt received her flu shot in October 2017. She denies vomiting, diarrhea, and sore throat. 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 No alleviating factors noted.      ROS: See HPI Constitutional: fever  Eyes: no drainage  ENT: no runny nose   Cardiovascular:  no chest pain  Resp: no SOB  GI: no vomiting GU: no dysuria Integumentary: no rash  Allergy: no hives  Musculoskeletal: no leg swelling  Neurological: no slurred speech ROS otherwise negative  PAST MEDICAL HISTORY/PAST SURGICAL HISTORY:  Past Medical History:  Diagnosis Date  . Arthritis    left knee  . Diabetes mellitus without complication ([place])   . DM (diabetes mellitus), type 2 ([place]) 08/23/2014   last A1C 5.5 % per patient. 05/2015  . GERD (gastroesophageal reflux disease)   . Headache   . Heart murmur    ECHO last friday10/7/16 see cardiology next month Nov 2016  . Hydradenitis 02/09/2014  . Hypertension    Pt reports episode of HTN in 2009--no longer an issue 05/2015  . Hypothyroidism 08/23/2014   resolved since gastric sleeve surgery   . Iron deficiency anemia, unspecified 02/09/2014  . Obesity   . Pneumonia   . Thyroid disease     MEDICATIONS:  Prior to Admission medications   Medication Sig Start Date End Date Taking?  Authorizing Provider  ibuprofen (ADVIL,MOTRIN) 200 MG tablet Take 800 mg by mouth every 6 (six) hours as needed (for knee pain).   Yes Historical Provider, MD  levonorgestrel (MIRENA) 20 MCG/24HR IUD 1 each by Intrauterine route once.   Yes Historical Provider, MD  HYDROcodone-acetaminophen (NORCO) 5-325 MG tablet Take 1 tablet by mouth every 8 (eight) hours as needed for moderate pain. NO extra tylenol with this, take with stool softener if needed Patient not taking: Reported on 09/10/2016 08/30/15   Thao P Le, DO  meloxicam (MOBIC) 15 MG tablet Take 1 tablet (15 mg total) by mouth daily. Patient not taking: Reported on 09/10/2016 03/09/16   Leonie Douglas, PA-C  ondansetron (ZOFRAN ODT) 4 MG disintegrating tablet Take 1 tablet (4 mg total) by mouth every 8 (eight) hours as needed for nausea or vomiting. Patient not taking: Reported on 09/10/2016 08/05/16   Mary-Margaret Hassell Done, FNP  promethazine (PHENERGAN) 12.5 MG tablet Take 1 tablet (12.5 mg total) by mouth every 8 (eight) hours as needed for nausea or vomiting. Patient not taking: Reported on 09/10/2016 08/05/16   Mary-Margaret Hassell Done, FNP    ALLERGIES:  Allergies  Allergen Reactions  . Bee Venom Anaphylaxis    SOCIAL HISTORY:  Social History  Substance Use Topics  . Smoking status: Never Smoker  . Smokeless tobacco: Never Used  . Alcohol use Yes  Comment: occassional, not since august     FAMILY HISTORY: Family History  Problem Relation Age of Onset  . Heart disease Mother   . Diabetes Mother   . Hypertension Mother   . Pulmonary fibrosis Mother   . Diabetes Father   . Arthritis Father   . Other Father   . Heart murmur Sister   . Stroke Other   . Heart failure Other   . Cancer Maternal Uncle     brain ca  . Pulmonary fibrosis Maternal Grandmother   . Heart murmur      EXAM: BP 111/68   Pulse 76   Temp 98.6 F (37 C) (Oral)   Resp 18   Ht 5' (1.524 m)   Wt 188 lb (85.3 kg)   LMP 08/27/2016 (Approximate)    SpO2 100%   BMI 36.72 kg/m  CONSTITUTIONAL: Alert and oriented and responds appropriately to questions. Well-appearing; well-nourished, Nontoxic-appearing HEAD: Normocephalic EYES: Conjunctivae clear, PERRL, EOMI ENT: normal nose; no rhinorrhea; moist mucous membranes; No pharyngeal erythema or petechiae, no tonsillar hypertrophy or exudate, no uvular deviation, no unilateral swelling, no trismus or drooling, no muffled voice, normal phonation, no stridor, no dental caries present, no drainable dental abscess noted, no Ludwig's angina, tongue sits flat in the bottom of the mouth, no angioedema, no facial erythema or warmth, no facial swelling; no pain with movement of the neck NECK: Supple, no meningismus, no nuchal rigidity, no LAD  CARD: RRR; S1 and S2 appreciated; no murmurs, no clicks, no rubs, no gallops RESP: Normal chest excursion without splinting or tachypnea; breath sounds clear and equal bilaterally; no wheezes, no rhonchi, no rales, no hypoxia or respiratory distress, speaking full sentences ABD/GI: Normal bowel sounds; non-distended; soft, non-tender, no rebound, no guarding, no peritoneal signs, no hepatosplenomegaly BACK:  The back appears normal and is non-tender to palpation, there is no CVA tenderness EXT: Normal ROM in all joints; non-tender to palpation; no edema; normal capillary refill; no cyanosis, no calf tenderness or swelling    SKIN: Normal color for age and race; warm; no rash NEURO: Moves all extremities equally, sensation to light touch intact diffusely, cranial nerves II through XII intact, normal speech; normal gait PSYCH: The patient's mood and manner are appropriate. Grooming and personal hygiene are appropriate.  MEDICAL DECISION MAKING: Patient here with flulike symptoms. Have offered patient Tamiflu as she is within the 48-hour window of symptom onset but she declines after we have discussed risk and benefits. Labs ordered in triage are unremarkable. Normal  lactate. Chest x-ray clear. Urine shows no sign of infection. Recommended alternating Tylenol and Motrin for fever and pain. Recommended increase rest and fluid intake. She still having some back pain and nausea currently. Will give ibuprofen, Zofran. Will check pregnancy test. Patient has an IUD. Discussed with patient that at this time we are not performing nonemergent influenza testing as it will not change her plan of care. Anticipate discharge home with work note. She is comfortable with this plan.  ED PROGRESS: Patient's pregnancy test is negative. Will discharge home.     At this time, I do not feel there is any life-threatening condition present. I have reviewed and discussed all results (EKG, imaging, lab, urine as appropriate) and exam findings with patient/family. I have reviewed nursing notes and appropriate previous records.  I feel the patient is safe to be discharged home without further emergent workup and can continue workup as an outpatient as needed. Discussed usual and customary return precautions.  Patient/family verbalize understanding and are comfortable with this plan.  Outpatient follow-up has been provided. All questions have been answered.   I personally performed the services described in this documentation, which was scribed in my presence. The recorded information has been reviewed and is accurate.     [place], DO 09/11/16 [phone]

## 2016-09-10 NOTE — Discharge Instructions (Signed)
You may alternate between Tylenol 1000 mg every 6 hours as needed for fever and pain and ibuprofen 800 mg every 8 hours as needed for fever and pain. Please rest, drink plenty of fluids. Your labs, urine and chest x-ray today were normal. This is likely a viral illness, possibly influenza. You do not need antibiotics for viral illnesses. You need to stay out of work until you have not had a fever for 24 hours.  Please return to emergency department if you begin having shortness of breath, vomiting and cannot stop, feel like you may pass out or do pass out.

## 2016-09-10 NOTE — ED Triage Notes (Signed)
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

## 2016-09-11 LAB — POC URINE PREG, ED: PREG TEST UR: NEGATIVE

## 2016-09-12 LAB — URINE CULTURE

## 2016-09-13 DIAGNOSIS — E669 Obesity, unspecified: Secondary | ICD-10-CM | POA: Diagnosis not present

## 2016-09-13 DIAGNOSIS — Z9884 Bariatric surgery status: Secondary | ICD-10-CM | POA: Diagnosis not present

## 2016-09-15 LAB — CULTURE, BLOOD (ROUTINE X 2): Culture: NO GROWTH

## 2016-09-16 LAB — CULTURE, BLOOD (ROUTINE X 2): CULTURE: NO GROWTH

## 2016-10-25 ENCOUNTER — Ambulatory Visit: Payer: 59 | Admitting: Skilled Nursing Facility1

## 2016-12-04 DIAGNOSIS — W57XXXA Bitten or stung by nonvenomous insect and other nonvenomous arthropods, initial encounter: Secondary | ICD-10-CM | POA: Diagnosis not present

## 2016-12-04 DIAGNOSIS — S40861A Insect bite (nonvenomous) of right upper arm, initial encounter: Secondary | ICD-10-CM | POA: Diagnosis not present

## 2016-12-04 DIAGNOSIS — S80862A Insect bite (nonvenomous), left lower leg, initial encounter: Secondary | ICD-10-CM | POA: Diagnosis not present

## 2016-12-04 DIAGNOSIS — S40862A Insect bite (nonvenomous) of left upper arm, initial encounter: Secondary | ICD-10-CM | POA: Diagnosis not present

## 2016-12-16 DIAGNOSIS — L03116 Cellulitis of left lower limb: Secondary | ICD-10-CM | POA: Diagnosis not present

## 2016-12-16 DIAGNOSIS — L03115 Cellulitis of right lower limb: Secondary | ICD-10-CM | POA: Diagnosis not present

## 2016-12-25 ENCOUNTER — Ambulatory Visit: Payer: 59 | Admitting: Skilled Nursing Facility1

## 2016-12-25 ENCOUNTER — Telehealth: Payer: Self-pay | Admitting: Skilled Nursing Facility1

## 2016-12-25 NOTE — Telephone Encounter (Signed)
Dietitian called to reschedule the pts no-show> Pt states she had no idea her appointment was today and would like to reschedule. Dietitian had a difficult time finding a spot for her due to her excessive workout schedule when dietitian called she was at the gym.

## 2016-12-26 ENCOUNTER — Encounter: Payer: 59 | Attending: General Surgery | Admitting: Skilled Nursing Facility1

## 2016-12-26 ENCOUNTER — Encounter: Payer: Self-pay | Admitting: Skilled Nursing Facility1

## 2016-12-26 DIAGNOSIS — Z9884 Bariatric surgery status: Secondary | ICD-10-CM | POA: Diagnosis not present

## 2016-12-26 DIAGNOSIS — E119 Type 2 diabetes mellitus without complications: Secondary | ICD-10-CM

## 2016-12-26 DIAGNOSIS — Z713 Dietary counseling and surveillance: Secondary | ICD-10-CM | POA: Diagnosis not present

## 2016-12-26 NOTE — Progress Notes (Signed)
  Follow-up visit:  1.5 years Post-Operative Sleeve Gastrectomy Surgery  Medical Nutrition Therapy:  Appt start time: 6962 end time: 1205  Primary concerns today: Post-operative Bariatric Surgery Nutrition Management.   Pt states she does not check her blood sugar and her A1C 4.8. Pt states she was doing C4 getting a hearrate up to 180 so she stopped that. Pt repeated she is obsessed with working out and following her nutrients and states saying that out loud maybe I do need to talk with a therapist. Pt is still continuing to workout several times a day but has declined from what she sued to do. Pt staets she is currently going through a divorce and is going to school for nursing realizing she wants to become a NP.   Surgery date: 03/29/2015 Surgery type: Sleeve Gastrectomy Start weight at [facility]: 352.5 lbs on 11/04/14 Weight today: 191.8 lbs Weight change: 4 lbs Total weight loss: 160.7 lbs Weight loss goal: 160 lbs  TANITA  BODY COMP RESULTS  02/14/15 04/12/15 06/14/15 07/14/15 08/31/15 11/02/15 02/02/16 05/10/16 08/21/16   BMI (kg/m^2) 66.3 61.1 53.6 51.5 47.5 43.6 39.3 37.0 36.2   Fat Mass (lbs) 188 185.5 141.0 140.5 123 100.5 88.6 71.0 73.2   Fat Free Mass (lbs) 163 138.0 142.5 132.0 128 130.5 119.2 124.8 118.6   Total Body Water (lbs) 119.5 101.0 104.5 96.5 93.5 95.5 86.6 90.2 85.4   Preferred Learning Style:   No preference indicated   Learning Readiness:   Ready  24-hr recall: B (10 AM): Premier Protein shake (30g) Snk (AM): [place] yogurt (9-12 g) sometimes with added crunchies if doing more exercise or chips Snk (PM): Premier Protein shake/herbalife shake with 28 g of protein (30g) L (6 PM): scrambled egg OR salad with boiled egg, tuna, and shrimp or stir fries (7-14g) Snk (PM): veggie burger or salad 40-60g of CHO  Fluid intake: 22 oz protein shake, 48-60 oz water and crystal light cran lemonade, 15 calorie juice (trys to get in 2 protein shakes) Estimated total protein intake: 60+  grams  Medications: see list Supplementation: taking MVI, not taking calcium or B12, doing better  CBG monitoring: not testing Average CBG per patient: N/A Last patient reported A1c: 4.3% in September 2017  Using straws: Not usually  Drinking while eating: tried some recently with peanut butter, tries to wait 30 minutes to drink Hair loss: No Carbonated beverages: sparkling water, lets get flat every once in a while N/V/D/C: had nausea last week and not sure why, constipation has gotten better Dumping syndrome: No  Recent physical activity:  [employer] - Zumba 7 x week, treadmill and elliptical 60 minutes most days, 3-4 x per DAY on days off:   Progress Towards Goal(s):  In progress.  Handouts given during visit include:  none   Nutritional Diagnosis:  [place]-3.3 Overweight/obesity related to past poor dietary habits and physical inactivity as evidenced by patient w/ recent sleeve gastrectomy surgery following dietary guidelines for continued weight loss.  Intervention:  Nutrition education/reinforcement. Encouraged patient to see a therapist as soon as possible. Explained benefit of rest days and carbohydrates for muscle recovery.   Goals: -Call the counseling center  Teaching Method Utilized:  Visual Auditory Hands on  Barriers to learning/adherence to lifestyle change: none  Demonstrated degree of understanding via:  Teach Back   Monitoring/Evaluation:  Dietary intake, exercise, and body weight. Follow up in 2 months.

## 2016-12-27 ENCOUNTER — Ambulatory Visit: Payer: 59 | Admitting: Skilled Nursing Facility1

## 2016-12-28 ENCOUNTER — Other Ambulatory Visit: Payer: Self-pay | Admitting: Obstetrics and Gynecology

## 2016-12-28 DIAGNOSIS — Z01419 Encounter for gynecological examination (general) (routine) without abnormal findings: Secondary | ICD-10-CM | POA: Diagnosis not present

## 2016-12-28 DIAGNOSIS — Z113 Encounter for screening for infections with a predominantly sexual mode of transmission: Secondary | ICD-10-CM | POA: Diagnosis not present

## 2016-12-28 DIAGNOSIS — Z6837 Body mass index (BMI) 37.0-37.9, adult: Secondary | ICD-10-CM | POA: Diagnosis not present

## 2017-03-28 ENCOUNTER — Ambulatory Visit: Payer: 59 | Admitting: Skilled Nursing Facility1

## 2017-04-22 DIAGNOSIS — H5213 Myopia, bilateral: Secondary | ICD-10-CM | POA: Diagnosis not present

## 2017-04-25 DIAGNOSIS — E119 Type 2 diabetes mellitus without complications: Secondary | ICD-10-CM | POA: Diagnosis not present

## 2017-04-25 DIAGNOSIS — Z9884 Bariatric surgery status: Secondary | ICD-10-CM | POA: Diagnosis not present

## 2017-04-25 DIAGNOSIS — Z8679 Personal history of other diseases of the circulatory system: Secondary | ICD-10-CM | POA: Diagnosis not present

## 2017-04-25 DIAGNOSIS — Z0000 Encounter for general adult medical examination without abnormal findings: Secondary | ICD-10-CM | POA: Diagnosis not present

## 2017-04-25 DIAGNOSIS — E559 Vitamin D deficiency, unspecified: Secondary | ICD-10-CM | POA: Diagnosis not present

## 2017-04-25 DIAGNOSIS — E039 Hypothyroidism, unspecified: Secondary | ICD-10-CM | POA: Diagnosis not present

## 2017-06-12 DIAGNOSIS — E669 Obesity, unspecified: Secondary | ICD-10-CM | POA: Diagnosis not present

## 2017-06-12 DIAGNOSIS — Z9884 Bariatric surgery status: Secondary | ICD-10-CM | POA: Diagnosis not present

## 2017-09-26 ENCOUNTER — Encounter (INDEPENDENT_AMBULATORY_CARE_PROVIDER_SITE_OTHER): Payer: 59

## 2017-10-07 ENCOUNTER — Ambulatory Visit (INDEPENDENT_AMBULATORY_CARE_PROVIDER_SITE_OTHER): Payer: 59 | Admitting: Family Medicine

## 2017-10-09 IMAGING — CR DG CHEST 2V
2 series · 2 of 2 positions shown · non-contrast
Comparison: May 20, 2015

CLINICAL DATA: Shortness of breath with fever and cough. Chest
pain.

EXAM:
CHEST  2 VIEW

[chest pa]
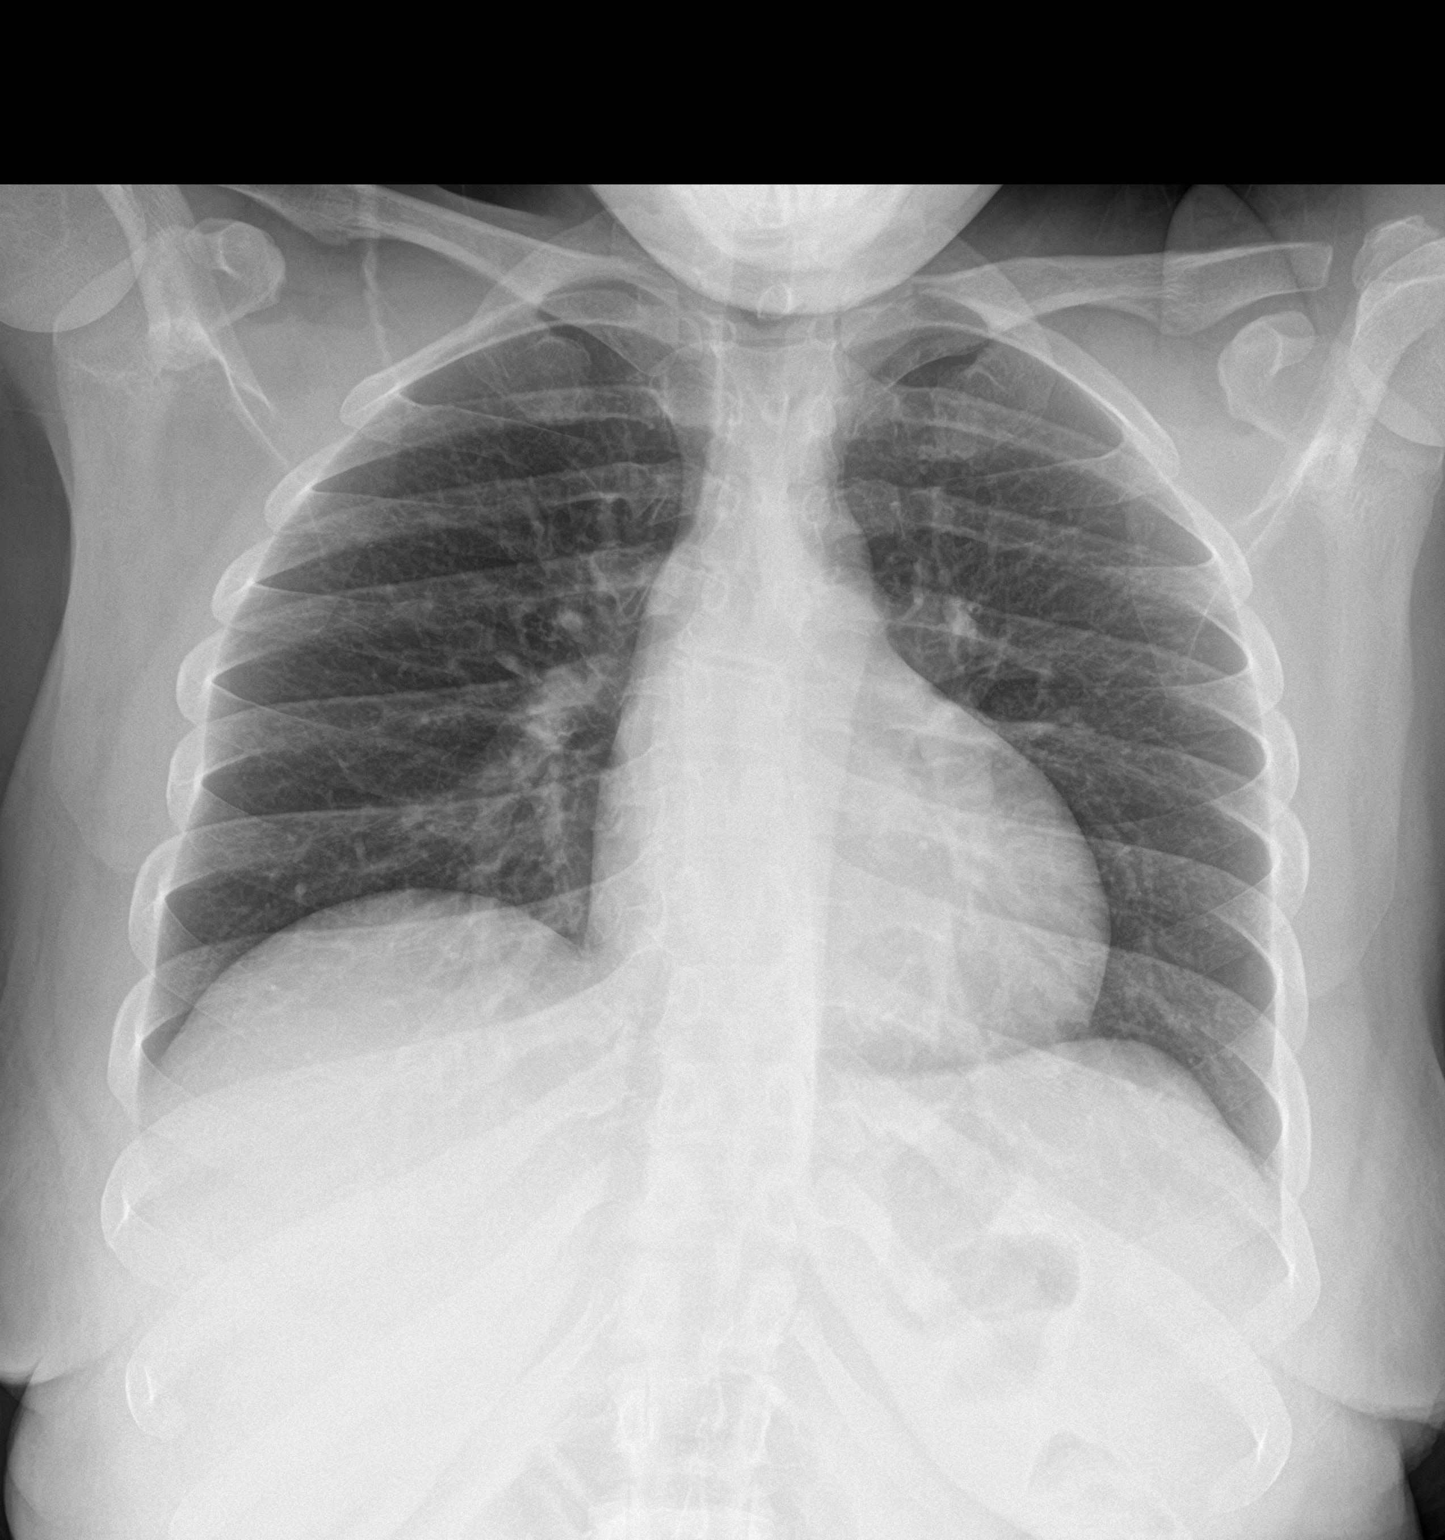

[chest lat]
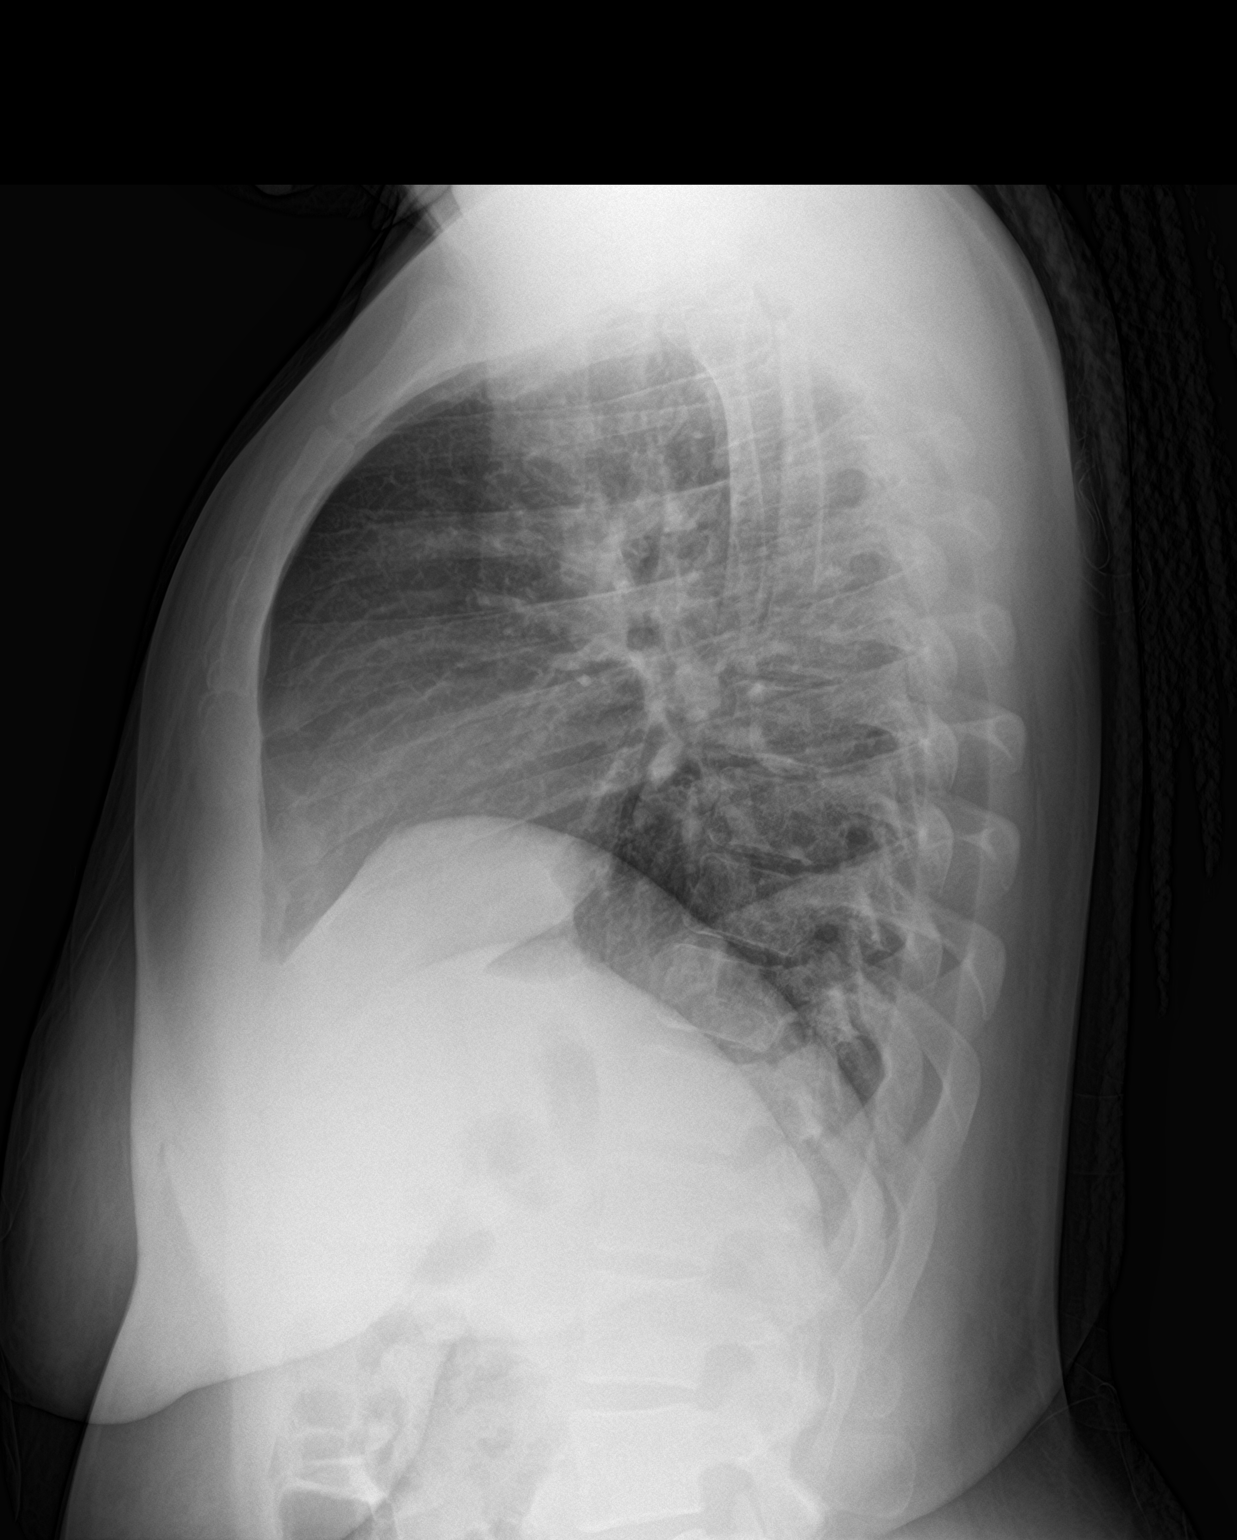

[2 of 2 positions shown; findings below may reference images not displayed]

FINDINGS: Lungs are clear. The heart size and pulmonary vascularity are
normal. No adenopathy. No pneumothorax. There is mild mid thoracic
dextroscoliosis.
IMPRESSION: No edema or consolidation.

## 2017-12-29 DIAGNOSIS — M179 Osteoarthritis of knee, unspecified: Secondary | ICD-10-CM | POA: Diagnosis not present

## 2017-12-29 DIAGNOSIS — M25562 Pain in left knee: Secondary | ICD-10-CM | POA: Diagnosis not present

## 2018-01-02 DIAGNOSIS — E119 Type 2 diabetes mellitus without complications: Secondary | ICD-10-CM | POA: Diagnosis not present

## 2018-01-02 DIAGNOSIS — D72829 Elevated white blood cell count, unspecified: Secondary | ICD-10-CM | POA: Diagnosis not present

## 2018-01-02 DIAGNOSIS — M25562 Pain in left knee: Secondary | ICD-10-CM | POA: Diagnosis not present

## 2018-01-02 DIAGNOSIS — E559 Vitamin D deficiency, unspecified: Secondary | ICD-10-CM | POA: Diagnosis not present

## 2018-01-02 DIAGNOSIS — Z8679 Personal history of other diseases of the circulatory system: Secondary | ICD-10-CM | POA: Diagnosis not present

## 2018-01-02 DIAGNOSIS — Z713 Dietary counseling and surveillance: Secondary | ICD-10-CM | POA: Diagnosis not present

## 2018-01-02 DIAGNOSIS — Z9884 Bariatric surgery status: Secondary | ICD-10-CM | POA: Diagnosis not present

## 2018-01-02 DIAGNOSIS — E039 Hypothyroidism, unspecified: Secondary | ICD-10-CM | POA: Diagnosis not present

## 2018-01-22 DIAGNOSIS — Z9884 Bariatric surgery status: Secondary | ICD-10-CM | POA: Diagnosis not present

## 2018-01-24 ENCOUNTER — Telehealth: Payer: Self-pay | Admitting: Hematology and Oncology

## 2018-01-24 ENCOUNTER — Telehealth: Payer: Self-pay

## 2018-01-24 NOTE — Telephone Encounter (Signed)
She called and left a message. Requesting appt to see Dr. Alvy Bimler. She has been seen in the past.

## 2018-01-24 NOTE — Telephone Encounter (Signed)
PLs send scheduling msg No need labs until I see her

## 2018-01-24 NOTE — Telephone Encounter (Signed)
Called and left a message that scheduling would be calling.

## 2018-01-24 NOTE — Telephone Encounter (Signed)
Spoke to patient regarding upcoming June appointments per 6/21 sch message

## 2018-01-27 ENCOUNTER — Ambulatory Visit: Payer: 59 | Admitting: Hematology and Oncology

## 2018-01-27 ENCOUNTER — Encounter: Payer: Self-pay | Admitting: Hematology and Oncology

## 2018-04-10 ENCOUNTER — Telehealth: Payer: Self-pay | Admitting: Hematology and Oncology

## 2018-04-10 NOTE — Telephone Encounter (Signed)
Appts scheduled and message left on VM for [facility] with date/time per 9/5 staff message

## 2018-04-15 DIAGNOSIS — H5213 Myopia, bilateral: Secondary | ICD-10-CM | POA: Diagnosis not present

## 2018-04-17 ENCOUNTER — Inpatient Hospital Stay: Payer: 59 | Admitting: Hematology and Oncology

## 2018-04-17 ENCOUNTER — Telehealth: Payer: Self-pay | Admitting: Hematology and Oncology

## 2018-04-17 NOTE — Telephone Encounter (Signed)
Patient called to reschedule  °

## 2018-05-01 ENCOUNTER — Inpatient Hospital Stay: Payer: 59 | Attending: Hematology and Oncology | Admitting: Hematology and Oncology

## 2018-09-18 DIAGNOSIS — Z9884 Bariatric surgery status: Secondary | ICD-10-CM | POA: Diagnosis not present

## 2018-09-18 DIAGNOSIS — Z8679 Personal history of other diseases of the circulatory system: Secondary | ICD-10-CM | POA: Diagnosis not present

## 2018-09-18 DIAGNOSIS — Z0000 Encounter for general adult medical examination without abnormal findings: Secondary | ICD-10-CM | POA: Diagnosis not present

## 2018-09-18 DIAGNOSIS — R635 Abnormal weight gain: Secondary | ICD-10-CM | POA: Diagnosis not present

## 2018-09-18 DIAGNOSIS — E559 Vitamin D deficiency, unspecified: Secondary | ICD-10-CM | POA: Diagnosis not present

## 2018-09-18 DIAGNOSIS — E119 Type 2 diabetes mellitus without complications: Secondary | ICD-10-CM | POA: Diagnosis not present

## 2018-09-18 DIAGNOSIS — Z202 Contact with and (suspected) exposure to infections with a predominantly sexual mode of transmission: Secondary | ICD-10-CM | POA: Diagnosis not present

## 2018-09-18 DIAGNOSIS — Z13220 Encounter for screening for lipoid disorders: Secondary | ICD-10-CM | POA: Diagnosis not present

## 2018-09-18 DIAGNOSIS — E039 Hypothyroidism, unspecified: Secondary | ICD-10-CM | POA: Diagnosis not present

## 2019-01-01 ENCOUNTER — Emergency Department (HOSPITAL_COMMUNITY)
Admission: EM | Admit: 2019-01-01 | Discharge: 2019-01-02 | Disposition: A | Discharge status: Home / Self Care | Payer: 59 | Attending: Emergency Medicine | Admitting: Emergency Medicine

## 2019-01-01 ENCOUNTER — Other Ambulatory Visit: Payer: Self-pay

## 2019-01-01 ENCOUNTER — Emergency Department (HOSPITAL_COMMUNITY): Payer: 59

## 2019-01-01 ENCOUNTER — Encounter (HOSPITAL_COMMUNITY): Payer: Self-pay

## 2019-01-01 DIAGNOSIS — M791 Myalgia, unspecified site: Secondary | ICD-10-CM | POA: Diagnosis not present

## 2019-01-01 DIAGNOSIS — E039 Hypothyroidism, unspecified: Secondary | ICD-10-CM | POA: Diagnosis not present

## 2019-01-01 DIAGNOSIS — R0789 Other chest pain: Secondary | ICD-10-CM | POA: Diagnosis not present

## 2019-01-01 DIAGNOSIS — I10 Essential (primary) hypertension: Secondary | ICD-10-CM | POA: Diagnosis not present

## 2019-01-01 DIAGNOSIS — R05 Cough: Secondary | ICD-10-CM | POA: Insufficient documentation

## 2019-01-01 DIAGNOSIS — R6889 Other general symptoms and signs: Secondary | ICD-10-CM | POA: Diagnosis not present

## 2019-01-01 DIAGNOSIS — R110 Nausea: Secondary | ICD-10-CM | POA: Insufficient documentation

## 2019-01-01 DIAGNOSIS — E669 Obesity, unspecified: Secondary | ICD-10-CM | POA: Diagnosis not present

## 2019-01-01 DIAGNOSIS — R509 Fever, unspecified: Secondary | ICD-10-CM | POA: Insufficient documentation

## 2019-01-01 DIAGNOSIS — E119 Type 2 diabetes mellitus without complications: Secondary | ICD-10-CM | POA: Diagnosis not present

## 2019-01-01 DIAGNOSIS — Z20828 Contact with and (suspected) exposure to other viral communicable diseases: Secondary | ICD-10-CM | POA: Diagnosis not present

## 2019-01-01 MED ORDER — PROMETHAZINE HCL 25 MG PO TABS
25.0000 mg | ORAL_TABLET | ORAL | Status: AC
  Administered 2019-01-01: 25 mg via ORAL
  Filled 2019-01-01: qty 1

## 2019-01-01 NOTE — ED Notes (Signed)
Bed: WTR5 Expected date:  Expected time:  Means of arrival:  Comments: 

## 2019-01-01 NOTE — ED Triage Notes (Signed)
Pt states she was having a fever, dry cough and body aches, she was tested for COVID at [place] [facility] this morning and was told to come get a chest xray

## 2019-01-02 DIAGNOSIS — U071 COVID-19: Secondary | ICD-10-CM | POA: Diagnosis not present

## 2019-01-02 MED ORDER — PROMETHAZINE HCL 25 MG PO TABS: 25.0000 mg | ORAL_TABLET | Freq: Four times a day (QID) | ORAL | 0 refills | Status: DC | PRN

## 2019-01-02 NOTE — ED Provider Notes (Signed)
[facility] DEPT Provider Note   CSN: [id number] Arrival date & time: 01/01/19  2256    History   Chief Complaint Chief Complaint  Patient presents with  . needs chest xray    HPI Lorraine Chambers is a 33 y.o. female.     33 year old female presents to the emergency department requesting a chest x-ray.  She began experiencing upper respiratory symptoms with fever yesterday.  Maximum temperature has been 101.18F.  Fever responding to antipyretics taken at 1800 tonight.  Patient reporting associated dry cough as well as body aches and nausea.  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  She has yet to receive the results of this test, but began having more pleuritic discomfort this afternoon and was advised by a doctor to receive a chest x-ray.  She does not feel short of breath at this time.  No medications taken prior to arrival.     Past Medical History:  Diagnosis Date  . Arthritis    left knee  . Diabetes mellitus without complication ([place])   . DM (diabetes mellitus), type 2 ([place]) 08/23/2014   last A1C 5.5 % per patient. 05/2015  . GERD (gastroesophageal reflux disease)   . Headache   . Heart murmur    ECHO last friday10/7/16 see cardiology next month Nov 2016  . Hydradenitis 02/09/2014  . Hypertension    Pt reports episode of HTN in 2009--no longer an issue 05/2015  . Hypothyroidism 08/23/2014   resolved since gastric sleeve surgery   . Iron deficiency anemia, unspecified 02/09/2014  . Obesity   . Pneumonia   . Thyroid disease     Patient Active Problem List   Diagnosis Date Noted  . Obesity 03/09/2016  . Gastric outlet obstruction 05/21/2015  . Fatty liver disease, nonalcoholic 78/46/9629  . S/P laparoscopic sleeve gastrectomy 03/29/2015  . Hypothyroidism 08/23/2014  . DM (diabetes mellitus), type 2 ([place]) 08/23/2014  . Hydradenitis 02/09/2014  . Iron deficiency  anemia 02/09/2014  . Reactive thrombocytosis 02/09/2014  . Immune to hepatitis B 09/24/2013  . Positive hepatitis C antibody test 09/24/2013  . Hepatitis C antibody test positive 09/24/2013  . HERPES SIMPLEX INFECTION, TYPE I 07/07/2008    Past Surgical History:  Procedure Laterality Date  . ESOPHAGOGASTRODUODENOSCOPY (EGD) WITH PROPOFOL N/A 05/23/2015   Procedure: ESOPHAGOGASTRODUODENOSCOPY (EGD) WITH PROPOFOL;  Surgeon: Alphonsa Overall, MD;  Location: [facility];  Service: General;  Laterality: N/A;  . LAPAROSCOPIC GASTRIC SLEEVE RESECTION N/A 03/29/2015   Procedure: LAPAROSCOPIC GASTRIC SLEEVE RESECTION WITH UPPER ENDO;  Surgeon: Greer Pickerel, MD;  Location: [facility];  Service: General;  Laterality: N/A;  . UPPER GI ENDOSCOPY  03/29/2015   Procedure: UPPER GI ENDOSCOPY;  Surgeon: Greer Pickerel, MD;  Location: [facility];  Service: General;;  . WISDOM TOOTH EXTRACTION       OB History   No obstetric history on file.      Home Medications    Prior to Admission medications   Medication Sig Start Date End Date Taking? Authorizing Provider  levonorgestrel (MIRENA) 20 MCG/24HR IUD 1 each by Intrauterine route once.    [provider]  ondansetron (ZOFRAN ODT) 4 MG disintegrating tablet Take 1-2 tablets (4-8 mg total) by mouth every 8 (eight) hours as needed for nausea or vomiting. 09/10/16   Ward, Delice Bison, DO  promethazine (PHENERGAN) 25 MG tablet Take 1 tablet (25 mg total) by mouth every  6 (six) hours as needed for nausea or vomiting. 01/02/19   Antonietta Breach, PA-C    Family History Family History  Problem Relation Age of Onset  . Heart disease Mother   . Diabetes Mother   . Hypertension Mother   . Pulmonary fibrosis Mother   . Diabetes Father   . Arthritis Father   . Other Father   . Heart murmur Sister   . Stroke Other   . Heart failure Other   . Cancer Maternal Uncle        brain ca  . Pulmonary fibrosis Maternal Grandmother   . Heart murmur Other     Social History  Social History   Tobacco Use  . Smoking status: Never Smoker  . Smokeless tobacco: Never Used  Substance Use Topics  . Alcohol use: Yes    Comment: occassional, not since august   . Drug use: No     Allergies   Bee venom   Review of Systems Review of Systems Ten systems reviewed and are negative for acute change, except as noted in the HPI.    Physical Exam Updated Vital Signs BP (!) 147/97 (BP Location: Right Arm)   Pulse 89   Temp 98.4 F (36.9 C) (Oral)   Resp 20   SpO2 98%   Physical Exam Vitals signs and nursing note reviewed.  Constitutional:      General: She is not in acute distress.    Appearance: She is well-developed. She is not diaphoretic.     Comments: Obese female, in NAD  HENT:     Head: Normocephalic and atraumatic.  Eyes:     General: No scleral icterus.    Conjunctiva/sclera: Conjunctivae normal.  Neck:     Musculoskeletal: Normal range of motion.  Cardiovascular:     Rate and Rhythm: Normal rate and regular rhythm.     Pulses: Normal pulses.  Pulmonary:     Effort: Pulmonary effort is normal. No respiratory distress.     Breath sounds: No stridor. No wheezing, rhonchi or rales.     Comments: Lungs CTAB. SpO2 97-100% on room air. No tachypnea or hypoxia. Musculoskeletal: Normal range of motion.  Skin:    General: Skin is warm and dry.     Coloration: Skin is not pale.     Findings: No erythema or rash.  Neurological:     General: No focal deficit present.     Mental Status: She is alert and oriented to person, place, and time.     Coordination: Coordination normal.     Comments: GCS 15. Moving all extremities spontaneously.  Psychiatric:        Behavior: Behavior normal.      ED Treatments / Results  Labs (all labs ordered are listed, but only abnormal results are displayed) Labs Reviewed - No data to display  EKG None  Radiology Dg Chest Port 1 View  Result Date: 01/02/2019 CLINICAL DATA:  Cough EXAM: PORTABLE CHEST 1  VIEW COMPARISON:  09/10/2016 FINDINGS: Borderline cardiomegaly. No focal airspace disease or effusion. Negative for pneumothorax. IMPRESSION: No active disease. Electronically Signed   By: Donavan Foil M.D.   On: 01/02/2019 00:08    Procedures Procedures (including critical care time)  Medications Ordered in ED Medications  promethazine (PHENERGAN) tablet 25 mg (25 mg Oral Given 01/01/19 2342)     Initial Impression / Assessment and Plan / ED Course  I have reviewed the triage vital signs and the nursing notes.  Pertinent labs &  imaging results that were available during my care of the patient were reviewed by me and considered in my medical decision making (see chart for details).        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 CXR negative for acute infiltrate. Patient's symptoms are consistent with URI, likely viral etiology. Patient will be discharged with symptomatic treatment.  She verbalizes understanding and is agreeable with plan. Advised to quarantine until COVID tests resulted. Patient is hemodynamically stable & in NAD prior to discharge.  Lorraine Chambers was evaluated in Emergency Department on 01/02/2019 for the symptoms described in the history of present illness. She was evaluated in the context of the global COVID-19 pandemic, which necessitated consideration that the patient might be at risk for infection with the SARS-CoV-2 virus that causes COVID-19. Institutional protocols and algorithms that pertain to the evaluation of patients at risk for COVID-19 are in a state of rapid change based on information released by regulatory bodies including the CDC and federal and state organizations. These policies and algorithms were followed during the patient's care in the ED.  Vitals:   01/01/19 2304  BP: (!) 147/97  Pulse: 89  Resp: 20  Temp: 98.4 F (36.9 C)  TempSrc: Oral  SpO2: 98%    Final Clinical  Impressions(s) / ED Diagnoses   Final diagnoses:  Febrile illness, acute    ED Discharge Orders         Ordered    promethazine (PHENERGAN) 25 MG tablet  Every 6 hours PRN     01/02/19 0033           Antonietta Breach, PA-C 01/02/19 0048    Fatima Blank, MD 01/06/19 [phone]

## 2019-01-02 NOTE — Discharge Instructions (Addendum)
Your chest x-ray is reassuring and did not show evidence of pneumonia or other viral respiratory illness.  Continue with home use of Tylenol or ibuprofen for fever control.  You may take Phenergan as needed for nausea.  Return for new or concerning symptoms.

## 2019-01-19 DIAGNOSIS — R0609 Other forms of dyspnea: Secondary | ICD-10-CM | POA: Diagnosis not present

## 2019-01-19 DIAGNOSIS — B342 Coronavirus infection, unspecified: Secondary | ICD-10-CM | POA: Diagnosis not present

## 2019-02-24 DIAGNOSIS — L988 Other specified disorders of the skin and subcutaneous tissue: Secondary | ICD-10-CM | POA: Diagnosis not present

## 2019-02-24 DIAGNOSIS — R030 Elevated blood-pressure reading, without diagnosis of hypertension: Secondary | ICD-10-CM | POA: Diagnosis not present

## 2019-02-24 DIAGNOSIS — L02411 Cutaneous abscess of right axilla: Secondary | ICD-10-CM | POA: Diagnosis not present

## 2019-03-30 DIAGNOSIS — H5213 Myopia, bilateral: Secondary | ICD-10-CM | POA: Diagnosis not present

## 2019-03-30 DIAGNOSIS — Z202 Contact with and (suspected) exposure to infections with a predominantly sexual mode of transmission: Secondary | ICD-10-CM | POA: Diagnosis not present

## 2019-03-30 DIAGNOSIS — N760 Acute vaginitis: Secondary | ICD-10-CM | POA: Diagnosis not present

## 2019-03-30 DIAGNOSIS — N898 Other specified noninflammatory disorders of vagina: Secondary | ICD-10-CM | POA: Diagnosis not present

## 2019-05-17 ENCOUNTER — Emergency Department (HOSPITAL_COMMUNITY)
Admission: EM | Admit: 2019-05-17 | Discharge: 2019-05-17 | Disposition: A | Discharge status: Home / Self Care | Payer: 59 | Attending: Emergency Medicine | Admitting: Emergency Medicine

## 2019-05-17 ENCOUNTER — Other Ambulatory Visit: Payer: Self-pay

## 2019-05-17 ENCOUNTER — Emergency Department (HOSPITAL_COMMUNITY): Payer: 59

## 2019-05-17 ENCOUNTER — Encounter (HOSPITAL_COMMUNITY): Payer: Self-pay | Admitting: Emergency Medicine

## 2019-05-17 DIAGNOSIS — E119 Type 2 diabetes mellitus without complications: Secondary | ICD-10-CM | POA: Insufficient documentation

## 2019-05-17 DIAGNOSIS — Z79899 Other long term (current) drug therapy: Secondary | ICD-10-CM | POA: Insufficient documentation

## 2019-05-17 DIAGNOSIS — I10 Essential (primary) hypertension: Secondary | ICD-10-CM | POA: Diagnosis not present

## 2019-05-17 DIAGNOSIS — R070 Pain in throat: Secondary | ICD-10-CM | POA: Diagnosis not present

## 2019-05-17 DIAGNOSIS — R1310 Dysphagia, unspecified: Secondary | ICD-10-CM | POA: Diagnosis not present

## 2019-05-17 DIAGNOSIS — R0989 Other specified symptoms and signs involving the circulatory and respiratory systems: Secondary | ICD-10-CM | POA: Diagnosis not present

## 2019-05-17 LAB — PREGNANCY, URINE: Preg Test, Ur: NEGATIVE

## 2019-05-17 LAB — GROUP A STREP BY PCR: Group A Strep by PCR: NOT DETECTED

## 2019-05-17 MED ORDER — FAMOTIDINE 20 MG PO TABS: 20.0000 mg | ORAL_TABLET | Freq: Two times a day (BID) | ORAL | 0 refills | Status: DC

## 2019-05-17 MED ORDER — ONDANSETRON 4 MG PO TBDP: 4.0000 mg | ORAL_TABLET | Freq: Three times a day (TID) | ORAL | 0 refills | Status: DC | PRN

## 2019-05-17 MED ORDER — PANTOPRAZOLE SODIUM 20 MG PO TBEC: 20.0000 mg | DELAYED_RELEASE_TABLET | Freq: Every day | ORAL | 1 refills | Status: DC

## 2019-05-17 MED ORDER — ALUM & MAG HYDROXIDE-SIMETH 200-200-20 MG/5ML PO SUSP
30.0000 mL | Freq: Once | ORAL | Status: AC
  Administered 2019-05-17: 22:00:00 30 mL via ORAL
  Filled 2019-05-17: qty 30

## 2019-05-17 MED ORDER — SUCRALFATE 1 GM/10ML PO SUSP: 1.0000 g | Freq: Three times a day (TID) | ORAL | 0 refills | Status: DC

## 2019-05-17 MED ORDER — LIDOCAINE VISCOUS HCL 2 % MT SOLN
15.0000 mL | Freq: Once | OROMUCOSAL | Status: AC
  Administered 2019-05-17: 22:00:00 15 mL via ORAL
  Filled 2019-05-17: qty 15

## 2019-05-17 NOTE — ED Provider Notes (Signed)
[facility] DEPT Provider Note   CSN: [id number] Arrival date & time: 05/17/19  1351     History   Chief Complaint Chief Complaint  Patient presents with  . Sore Throat  . something stuck in throat    HPI Lorraine Chambers is a 33 y.o. female.     HPI   Lorraine Chambers is a 33 y.o. female, with a history of DM, GERD, obesity, presenting to the ED with pain with swallowing beginning yesterday. Pain seems to be central and at about the level of the cricoid cartilage.  She states it feels like there is something stuck in her throat.  She also has burning in her throat and her chest that is consistent with her recurrent GERD.  She states she has had GERD symptoms pretty consistently since her gastric sleeve procedure 4 years ago. She has tenderness at the sternal notch in addition to the pain with swallowing.  She has been able to eat and drink without regurgitation, though there is some discomfort with eating and drinking.  She had a couple episodes of vomiting yesterday.  Denies fever/chills, abdominal pain, exertional chest pain, syncope, dizziness, shortness of breath, cough, hematochezia/melena, diarrhea, or any other complaints.    Past Medical History:  Diagnosis Date  . Arthritis    left knee  . Diabetes mellitus without complication ([place])   . DM (diabetes mellitus), type 2 ([place]) 08/23/2014   last A1C 5.5 % per patient. 05/2015  . GERD (gastroesophageal reflux disease)   . Headache   . Heart murmur    ECHO last friday10/7/16 see cardiology next month Nov 2016  . Hydradenitis 02/09/2014  . Hypertension    Pt reports episode of HTN in 2009--no longer an issue 05/2015  . Hypothyroidism 08/23/2014   resolved since gastric sleeve surgery   . Iron deficiency anemia, unspecified 02/09/2014  . Obesity   . Pneumonia   . Thyroid disease     Patient Active Problem List   Diagnosis Date Noted  . Obesity 03/09/2016  . Gastric outlet  obstruction 05/21/2015  . Fatty liver disease, nonalcoholic 0000000  . S/P laparoscopic sleeve gastrectomy 03/29/2015  . Hypothyroidism 08/23/2014  . DM (diabetes mellitus), type 2 ([place]) 08/23/2014  . Hydradenitis 02/09/2014  . Iron deficiency anemia 02/09/2014  . Reactive thrombocytosis 02/09/2014  . Immune to hepatitis B 09/24/2013  . Positive hepatitis C antibody test 09/24/2013  . Hepatitis C antibody test positive 09/24/2013  . HERPES SIMPLEX INFECTION, TYPE I 07/07/2008    Past Surgical History:  Procedure Laterality Date  . ESOPHAGOGASTRODUODENOSCOPY (EGD) WITH PROPOFOL N/A 05/23/2015   Procedure: ESOPHAGOGASTRODUODENOSCOPY (EGD) WITH PROPOFOL;  Surgeon: Alphonsa Overall, MD;  Location: [facility];  Service: General;  Laterality: N/A;  . LAPAROSCOPIC GASTRIC SLEEVE RESECTION N/A 03/29/2015   Procedure: LAPAROSCOPIC GASTRIC SLEEVE RESECTION WITH UPPER ENDO;  Surgeon: Greer Pickerel, MD;  Location: [facility];  Service: General;  Laterality: N/A;  . UPPER GI ENDOSCOPY  03/29/2015   Procedure: UPPER GI ENDOSCOPY;  Surgeon: Greer Pickerel, MD;  Location: [facility];  Service: General;;  . WISDOM TOOTH EXTRACTION       OB History   No obstetric history on file.      Home Medications    Prior to Admission medications   Medication Sig Start Date End Date Taking? Authorizing Provider  ibuprofen (ADVIL) 200 MG tablet Take 400 mg by mouth every 6 (six) hours as needed for headache or moderate pain.   Yes  [provider]  levonorgestrel (MIRENA) 20 MCG/24HR IUD 1 each by Intrauterine route once.   Yes [provider]  famotidine (PEPCID) 20 MG tablet Take 1 tablet (20 mg total) by mouth 2 (two) times daily for 5 days. 05/17/19 05/22/19  Laconya Clere C, PA-C  ondansetron (ZOFRAN ODT) 4 MG disintegrating tablet Take 1 tablet (4 mg total) by mouth every 8 (eight) hours as needed for nausea or vomiting. 05/17/19   Gurtha Picker C, PA-C  pantoprazole (PROTONIX) 20 MG tablet Take 1 tablet  (20 mg total) by mouth daily. 05/17/19 07/16/19  Viera Okonski, Helane Gunther, PA-C  promethazine (PHENERGAN) 25 MG tablet Take 1 tablet (25 mg total) by mouth every 6 (six) hours as needed for nausea or vomiting. Patient not taking: Reported on 05/17/2019 01/02/19   Antonietta Breach, PA-C  sucralfate (CARAFATE) 1 GM/10ML suspension Take 10 mLs (1 g total) by mouth 4 (four) times daily -  with meals and at bedtime. 05/17/19   Michel Hendon, Helane Gunther, PA-C    Family History Family History  Problem Relation Age of Onset  . Heart disease Mother   . Diabetes Mother   . Hypertension Mother   . Pulmonary fibrosis Mother   . Diabetes Father   . Arthritis Father   . Other Father   . Heart murmur Sister   . Stroke Other   . Heart failure Other   . Cancer Maternal Uncle        brain ca  . Pulmonary fibrosis Maternal Grandmother   . Heart murmur Other     Social History Social History   Tobacco Use  . Smoking status: Never Smoker  . Smokeless tobacco: Never Used  Substance Use Topics  . Alcohol use: Yes    Comment: occassional, not since august   . Drug use: No     Allergies   Bee venom   Review of Systems Review of Systems  Constitutional: Negative for chills and fever.  HENT: Positive for sore throat. Negative for drooling and voice change.        Pain with swallowing  Respiratory: Negative for cough, choking, shortness of breath and stridor.   Cardiovascular: Negative for chest pain and leg swelling.  Gastrointestinal: Positive for vomiting (none currently). Negative for abdominal pain, blood in stool, diarrhea and nausea.  Neurological: Negative for dizziness, syncope, light-headedness and headaches.  All other systems reviewed and are negative.    Physical Exam Updated Vital Signs BP (!) 148/95 (BP Location: Left Arm)   Pulse 87   Temp 99 F (37.2 C)   Resp 18   LMP 05/07/2019   SpO2 100%   Physical Exam Vitals signs and nursing note reviewed.  Constitutional:      General: She is not  in acute distress.    Appearance: She is well-developed. She is not diaphoretic.  HENT:     Head: Normocephalic and atraumatic.     Mouth/Throat:     Mouth: Mucous membranes are moist.     Pharynx: Oropharynx is clear. No oropharyngeal exudate or posterior oropharyngeal erythema.     Comments: Dentition appears to be intact and stable.  No noted area of swelling or fluctuance.  No trismus or noted abnormal phonation.  Mouth opening to at least 3 finger widths.  Handles oral secretions without difficulty.  In fact, as we are speaking patient does not visibly pause or show other signs of difficulty or discomfort with managing her oral secretions or swallowing.  No noted facial  swelling.  No sublingual swelling.  No swelling or tenderness to the submental or submandibular regions.  No swelling or tenderness into the soft tissues of the neck. However, patient does have some tenderness with light palpation at the sternal notch without noted fullness or swelling. Eyes:     Conjunctiva/sclera: Conjunctivae normal.  Neck:     Musculoskeletal: Normal range of motion and neck supple.  Cardiovascular:     Rate and Rhythm: Normal rate and regular rhythm.     Pulses: Normal pulses.          Radial pulses are 2+ on the right side and 2+ on the left side.       Posterior tibial pulses are 2+ on the right side and 2+ on the left side.     Heart sounds: Normal heart sounds.  Pulmonary:     Effort: Pulmonary effort is normal. No respiratory distress.     Breath sounds: Normal breath sounds.  Abdominal:     Palpations: Abdomen is soft.     Tenderness: There is no abdominal tenderness. There is no guarding.  Musculoskeletal:     Right lower leg: No edema.     Left lower leg: No edema.  Lymphadenopathy:     Cervical: No cervical adenopathy.  Skin:    General: Skin is warm and dry.  Neurological:     Mental Status: She is alert.  Psychiatric:        Mood and Affect: Mood and affect normal.         Speech: Speech normal.        Behavior: Behavior normal.      ED Treatments / Results  Labs (all labs ordered are listed, but only abnormal results are displayed) Labs Reviewed  GROUP A STREP BY PCR  PREGNANCY, URINE    EKG None  Radiology Dg Neck Soft Tissue  Result Date: 05/17/2019 CLINICAL DATA:  Foreign body sensation when swallowing. EXAM: NECK SOFT TISSUES - 1+ VIEW COMPARISON:  None. FINDINGS: There is no evidence of retropharyngeal soft tissue swelling or epiglottic enlargement. The cervical airway is unremarkable and no radio-opaque foreign body identified. IMPRESSION: Negative. Electronically Signed   By: Fidela Salisbury M.D.   On: 05/17/2019 21:08    Procedures Procedures (including critical care time)  Medications Ordered in ED Medications  alum & mag hydroxide-simeth (MAALOX/MYLANTA) 200-200-20 MG/5ML suspension 30 mL (30 mLs Oral Given 05/17/19 2143)    And  lidocaine (XYLOCAINE) 2 % viscous mouth solution 15 mL (15 mLs Oral Given 05/17/19 2143)     Initial Impression / Assessment and Plan / ED Course  I have reviewed the triage vital signs and the nursing notes.  Pertinent labs & imaging results that were available during my care of the patient were reviewed by me and considered in my medical decision making (see chart for details).        Patient presents with throat pain beginning yesterday.  She has a sensation of swelling discomfort. Patient is nontoxic appearing, afebrile, not tachycardic, not tachypneic, not hypotensive, maintains excellent SPO2 on room air, and is in no apparent distress.  Retropharyngeal abscess was considered, but thought less likely due to to the above findings and physical exam is not suggestive. She is tolerating oral secretions, has no visible swelling on exam, no tenderness or swelling to the soft tissues of the neck.  I watched the patient drink water with no regurgitation and no noted difficulty.  My suspicion for  complete or  significant esophageal obstruction is quite low at this time. GI follow-up. The patient was given instructions for home care as well as return precautions. Patient voices understanding of these instructions, accepts the plan, and is comfortable with discharge.  Findings and plan of care discussed with Gara Kroner, MD.    Vitals:   05/17/19 1412 05/17/19 1953 05/17/19 2308  BP: (!) 177/106 (!) 148/95 139/84  Pulse: (!) 102 87 84  Resp: 18 18 18   Temp: 99 F (37.2 C)  98.2 F (36.8 C)  TempSrc:   Oral  SpO2: 100% 100% 100%  Weight:   131.5 kg  Height:   5\' 6"  (1.676 m)     Final Clinical Impressions(s) / ED Diagnoses   Final diagnoses:  Throat pain    ED Discharge Orders         Ordered    sucralfate (CARAFATE) 1 GM/10ML suspension  3 times daily with meals & bedtime     05/17/19 2252    pantoprazole (PROTONIX) 20 MG tablet  Daily     05/17/19 2252    famotidine (PEPCID) 20 MG tablet  2 times daily     05/17/19 2252    ondansetron (ZOFRAN ODT) 4 MG disintegrating tablet  Every 8 hours PRN     05/17/19 2254           Lorayne Bender, PA-C 05/18/19 0034    Margette Fast, MD 05/18/19 1407

## 2019-05-17 NOTE — Discharge Instructions (Addendum)
°  Diet: Start with a clear liquid diet, progressed to a full liquid diet, and then bland solids as you are able. Please adhere to the enclosed dietary suggestions.  In general, avoid NSAIDs (i.e. ibuprofen, naproxen, etc.), caffeine, alcohol, spicy foods, fatty foods, or any other foods that seem to cause your symptoms to arise.  Protonix: Take this medication daily, 20-30 minutes prior to your first meal, for the next 8 weeks.  Continue to take this medication even if you begin to feel better.  Pepcid: Take this medication twice a day for the next 5 days.  Carafate: This medication is used to improve symptoms of throat and swallowing pain as well as discomfort associated with acid reflux.  Nausea/vomiting: Use the ondansetron (generic for Zofran) for nausea or vomiting.  This medication may not prevent all vomiting or nausea, but can help facilitate better hydration. Things that can help with nausea/vomiting also include peppermint/menthol candies, vitamin B12, and ginger.  Follow-up: Please follow-up with gastroenterology on this matter.  Return: Return to the ED for significantly worsening symptoms, inability to swallow liquids, persistent vomiting, persistent fever, vomiting blood, blood in the stools, dark stools, or any other major concerns.  For prescription assistance, may try using prescription discount sites or apps, such as goodrx.com

## 2019-05-17 NOTE — ED Triage Notes (Signed)
Pt feels something stuck in her throat since yesterday. Painful when swallows.

## 2019-05-17 NOTE — ED Notes (Signed)
Urine and culture sent down to lab

## 2019-11-25 ENCOUNTER — Ambulatory Visit (HOSPITAL_COMMUNITY)
Admission: EM | Admit: 2019-11-25 | Discharge: 2019-11-25 | Disposition: A | Discharge status: Home / Self Care | Payer: 59 | Attending: Family Medicine | Admitting: Family Medicine

## 2019-11-25 ENCOUNTER — Encounter (HOSPITAL_COMMUNITY): Payer: Self-pay

## 2019-11-25 ENCOUNTER — Other Ambulatory Visit: Payer: Self-pay

## 2019-11-25 DIAGNOSIS — N898 Other specified noninflammatory disorders of vagina: Secondary | ICD-10-CM | POA: Insufficient documentation

## 2019-11-25 NOTE — ED Triage Notes (Signed)
Patient reports vaginal irritation that began about one month ago.

## 2019-11-25 NOTE — ED Provider Notes (Signed)
[place]    CSN: [id number] Arrival date & time: 11/25/19  0818      History   Chief Complaint Chief Complaint  Patient presents with  . Vaginitis    HPI Lorraine Chambers is a 34 y.o. female.   Patient is a 34 year old female presents today with vaginal odor and mild itching.  The itching has been there for approximately 1 month or more.  The odor started soon after.  Location of the itching is more towards the pannus area.  Denies any open sores or drainage from the area.  Denies any vaginal discharge or vaginal itching.  Does have history of bacterial vaginosis.  No abdominal pain, back pain, fevers, dysuria, hematuria or urinary frequency. Patient's last menstrual period was 09/09/2019 (within days).  Does not typically get presenting and waxes and vaginal area.  ROS per HPI      Past Medical History:  Diagnosis Date  . Arthritis    left knee  . Diabetes mellitus without complication ([place])   . DM (diabetes mellitus), type 2 ([place]) 08/23/2014   last A1C 5.5 % per patient. 05/2015  . GERD (gastroesophageal reflux disease)   . Headache   . Heart murmur    ECHO last friday10/7/16 see cardiology next month Nov 2016  . Hydradenitis 02/09/2014  . Hypertension    Pt reports episode of HTN in 2009--no longer an issue 05/2015  . Hypothyroidism 08/23/2014   resolved since gastric sleeve surgery   . Iron deficiency anemia, unspecified 02/09/2014  . Obesity   . Pneumonia   . Thyroid disease     Patient Active Problem List   Diagnosis Date Noted  . Obesity 03/09/2016  . Gastric outlet obstruction 05/21/2015  . Fatty liver disease, nonalcoholic 0000000  . S/P laparoscopic sleeve gastrectomy 03/29/2015  . Hypothyroidism 08/23/2014  . DM (diabetes mellitus), type 2 ([place]) 08/23/2014  . Hydradenitis 02/09/2014  . Iron deficiency anemia 02/09/2014  . Reactive thrombocytosis 02/09/2014  . Immune to hepatitis B 09/24/2013  . Positive hepatitis C antibody test  09/24/2013  . Hepatitis C antibody test positive 09/24/2013  . HERPES SIMPLEX INFECTION, TYPE I 07/07/2008    Past Surgical History:  Procedure Laterality Date  . ESOPHAGOGASTRODUODENOSCOPY (EGD) WITH PROPOFOL N/A 05/23/2015   Procedure: ESOPHAGOGASTRODUODENOSCOPY (EGD) WITH PROPOFOL;  Surgeon: Alphonsa Overall, MD;  Location: [facility];  Service: General;  Laterality: N/A;  . LAPAROSCOPIC GASTRIC SLEEVE RESECTION N/A 03/29/2015   Procedure: LAPAROSCOPIC GASTRIC SLEEVE RESECTION WITH UPPER ENDO;  Surgeon: Greer Pickerel, MD;  Location: [facility];  Service: General;  Laterality: N/A;  . UPPER GI ENDOSCOPY  03/29/2015   Procedure: UPPER GI ENDOSCOPY;  Surgeon: Greer Pickerel, MD;  Location: [facility];  Service: General;;  . WISDOM TOOTH EXTRACTION      OB History   No obstetric history on file.      Home Medications    Prior to Admission medications   Medication Sig Start Date End Date Taking? Authorizing Provider  famotidine (PEPCID) 20 MG tablet Take 1 tablet (20 mg total) by mouth 2 (two) times daily for 5 days. 05/17/19 05/22/19  Joy, Shawn C, PA-C  ibuprofen (ADVIL) 200 MG tablet Take 400 mg by mouth every 6 (six) hours as needed for headache or moderate pain.    [provider]  levonorgestrel (MIRENA) 20 MCG/24HR IUD 1 each by Intrauterine route once.    [provider]  ondansetron (ZOFRAN ODT) 4 MG disintegrating tablet Take 1 tablet (4 mg  total) by mouth every 8 (eight) hours as needed for nausea or vomiting. 05/17/19   Joy, Shawn C, PA-C  pantoprazole (PROTONIX) 20 MG tablet Take 1 tablet (20 mg total) by mouth daily. 05/17/19 07/16/19  Joy, Helane Gunther, PA-C  promethazine (PHENERGAN) 25 MG tablet Take 1 tablet (25 mg total) by mouth every 6 (six) hours as needed for nausea or vomiting. Patient not taking: Reported on 05/17/2019 01/02/19   Antonietta Breach, PA-C  sucralfate (CARAFATE) 1 GM/10ML suspension Take 10 mLs (1 g total) by mouth 4 (four) times daily -  with meals and at  bedtime. 05/17/19   Joy, Helane Gunther, PA-C    Family History Family History  Problem Relation Age of Onset  . Heart disease Mother   . Diabetes Mother   . Hypertension Mother   . Pulmonary fibrosis Mother   . Diabetes Father   . Arthritis Father   . Other Father   . Heart murmur Sister   . Stroke Other   . Heart failure Other   . Cancer Maternal Uncle        brain ca  . Pulmonary fibrosis Maternal Grandmother   . Heart murmur Other     Social History Social History   Tobacco Use  . Smoking status: Never Smoker  . Smokeless tobacco: Never Used  Substance Use Topics  . Alcohol use: Yes    Comment: occassional, not since august   . Drug use: No     Allergies   Bee venom   Review of Systems Review of Systems   Physical Exam Triage Vital Signs ED Triage Vitals  Enc Vitals Group     BP 11/25/19 0837 140/84     Pulse Rate 11/25/19 0837 97     Resp 11/25/19 0837 16     Temp 11/25/19 0837 98.4 F (36.9 C)     Temp Source 11/25/19 0837 Oral     SpO2 11/25/19 0837 100 %     Weight --      Height --      Head Circumference --      Peak Flow --      Pain Score 11/25/19 0836 0     Pain Loc --      Pain Edu? --      Excl. in [place]? --    No data found.  Updated Vital Signs BP 140/84 (BP Location: Right Arm)   Pulse 97   Temp 98.4 F (36.9 C) (Oral)   Resp 16   LMP 09/09/2019 (Within Days)   SpO2 100%   Visual Acuity Right Eye Distance:   Left Eye Distance:   Bilateral Distance:    Right Eye Near:   Left Eye Near:    Bilateral Near:     Physical Exam Vitals and nursing note reviewed.  Constitutional:      General: She is not in acute distress.    Appearance: Normal appearance. She is not ill-appearing, toxic-appearing or diaphoretic.  HENT:     Head: Normocephalic.     Nose: Nose normal.  Eyes:     Conjunctiva/sclera: Conjunctivae normal.  Pulmonary:     Effort: Pulmonary effort is normal.  Abdominal:     Comments: No open sores, drainage,  erythema, discharge or odor under pannus  Musculoskeletal:        General: Normal range of motion.     Cervical back: Normal range of motion.  Skin:    General: Skin is warm and dry.  Findings: No rash.  Neurological:     Mental Status: She is alert.  Psychiatric:        Mood and Affect: Mood normal.      UC Treatments / Results  Labs (all labs ordered are listed, but only abnormal results are displayed) Labs Reviewed  CERVICOVAGINAL ANCILLARY ONLY    EKG   Radiology No results found.  Procedures Procedures (including critical care time)  Medications Ordered in UC Medications - No data to display  Initial Impression / Assessment and Plan / UC Course  I have reviewed the triage vital signs and the nursing notes.  Pertinent labs & imaging results that were available during my care of the patient were reviewed by me and considered in my medical decision making (see chart for details).     Vaginal odor Had patient obtain self swab for testing. Deferring treatment today. Skin under pannus appears normal.  Most likely itching is from hair regrowth in that area.  No concern for infection Follow up as needed for continued or worsening symptoms  Final Clinical Impressions(s) / UC Diagnoses   Final diagnoses:  Vaginal odor     Discharge Instructions     Sending your swab for testing.  We will call with any positive results. You  can check your MyChart for results    ED Prescriptions    None     PDMP not reviewed this encounter.   Orvan July, NP 11/25/19 [phone]

## 2019-11-25 NOTE — Discharge Instructions (Addendum)
Sending your swab for testing.  We will call with any positive results. You  can check your MyChart for results

## 2019-11-26 LAB — CERVICOVAGINAL ANCILLARY ONLY
Bacterial Vaginitis (gardnerella): NEGATIVE
Candida Glabrata: NEGATIVE
Candida Vaginitis: NEGATIVE
Chlamydia: NEGATIVE
Comment: NEGATIVE
Comment: NEGATIVE
Comment: NEGATIVE
Comment: NEGATIVE
Comment: NEGATIVE
Comment: NORMAL
Neisseria Gonorrhea: NEGATIVE
Trichomonas: NEGATIVE

## 2019-12-13 ENCOUNTER — Ambulatory Visit (HOSPITAL_COMMUNITY)
Admission: EM | Admit: 2019-12-13 | Discharge: 2019-12-13 | Disposition: A | Discharge status: Home / Self Care | Payer: 59 | Attending: Emergency Medicine | Admitting: Emergency Medicine

## 2019-12-13 ENCOUNTER — Other Ambulatory Visit: Payer: Self-pay

## 2019-12-13 ENCOUNTER — Encounter (HOSPITAL_COMMUNITY): Payer: Self-pay

## 2019-12-13 DIAGNOSIS — L739 Follicular disorder, unspecified: Secondary | ICD-10-CM | POA: Insufficient documentation

## 2019-12-13 DIAGNOSIS — N898 Other specified noninflammatory disorders of vagina: Secondary | ICD-10-CM | POA: Diagnosis not present

## 2019-12-13 DIAGNOSIS — Z113 Encounter for screening for infections with a predominantly sexual mode of transmission: Secondary | ICD-10-CM | POA: Insufficient documentation

## 2019-12-13 LAB — POC URINE PREG, ED: Preg Test, Ur: NEGATIVE

## 2019-12-13 LAB — HIV ANTIBODY (ROUTINE TESTING W REFLEX): HIV Screen 4th Generation wRfx: NONREACTIVE

## 2019-12-13 MED ORDER — FLUCONAZOLE 150 MG PO TABS: 150.0000 mg | ORAL_TABLET | Freq: Once | ORAL | 1 refills | Status: AC

## 2019-12-13 MED ORDER — METRONIDAZOLE 500 MG PO TABS: 500.0000 mg | ORAL_TABLET | Freq: Two times a day (BID) | ORAL | 0 refills | Status: AC

## 2019-12-13 MED ORDER — CEPHALEXIN 500 MG PO CAPS: 1000.0000 mg | ORAL_CAPSULE | Freq: Two times a day (BID) | ORAL | 0 refills | Status: AC

## 2019-12-13 MED ORDER — MUPIROCIN 2 % EX OINT: 1.0000 "application " | TOPICAL_OINTMENT | Freq: Three times a day (TID) | CUTANEOUS | 0 refills | Status: DC

## 2019-12-13 NOTE — ED Provider Notes (Signed)
HPI  SUBJECTIVE:  Lorraine Chambers is a 34 y.o. female who presents with vaginal itching for the past 2 weeks.  She reports 2 painful "bumps" on her external labia starting 2 days ago after getting waxed.  She denies any drainage from these bumps.  She is not sure if there are blisters.  No vaginal discharge, odor, abnormal vaginal bleeding.  She is currently on menses.  No vomiting, fevers, abdominal, back or pelvic pain.  No urinary complaints.  She has tried using a lidocaine gel, sugar scrub gel, and an oil to prevent ingrown hairs without improvement in her symptoms.  No aggravating factors.  She is in a long-term monogamous relationship with a female who is asymptomatic, however she would like to be tested for STDs today.  She has a past medical history of chlamydia, HSV 1, BV.  No history of gonorrhea, HIV, HSV 2, trichomonas, yeast, syphilis.  States that she is no longer a diabetic.  No history of MRSA.  LMP: Now.  Denies possibility pregnant.  She has an IUD.  PMD: [facility] [employer]   Past Medical History:  Diagnosis Date  . Arthritis    left knee  . Diabetes mellitus without complication ([place])   . DM (diabetes mellitus), type 2 ([place]) 08/23/2014   last A1C 5.5 % per patient. 05/2015  . GERD (gastroesophageal reflux disease)   . Headache   . Heart murmur    ECHO last friday10/7/16 see cardiology next month Nov 2016  . Hydradenitis 02/09/2014  . Hypertension    Pt reports episode of HTN in 2009--no longer an issue 05/2015  . Hypothyroidism 08/23/2014   resolved since gastric sleeve surgery   . Iron deficiency anemia, unspecified 02/09/2014  . Obesity   . Pneumonia   . Thyroid disease     Past Surgical History:  Procedure Laterality Date  . ESOPHAGOGASTRODUODENOSCOPY (EGD) WITH PROPOFOL N/A 05/23/2015   Procedure: ESOPHAGOGASTRODUODENOSCOPY (EGD) WITH PROPOFOL;  Surgeon: Alphonsa Overall, MD;  Location: [facility];  Service: General;  Laterality: N/A;  .  LAPAROSCOPIC GASTRIC SLEEVE RESECTION N/A 03/29/2015   Procedure: LAPAROSCOPIC GASTRIC SLEEVE RESECTION WITH UPPER ENDO;  Surgeon: Greer Pickerel, MD;  Location: [facility];  Service: General;  Laterality: N/A;  . UPPER GI ENDOSCOPY  03/29/2015   Procedure: UPPER GI ENDOSCOPY;  Surgeon: Greer Pickerel, MD;  Location: [facility];  Service: General;;  . WISDOM TOOTH EXTRACTION      Family History  Problem Relation Age of Onset  . Heart disease Mother   . Diabetes Mother   . Hypertension Mother   . Pulmonary fibrosis Mother   . Diabetes Father   . Arthritis Father   . Other Father   . Heart murmur Sister   . Stroke Other   . Heart failure Other   . Cancer Maternal Uncle        brain ca  . Pulmonary fibrosis Maternal Grandmother   . Heart murmur Other     Social History   Tobacco Use  . Smoking status: Never Smoker  . Smokeless tobacco: Never Used  Substance Use Topics  . Alcohol use: Yes    Comment: occassional, not since august   . Drug use: No    No current facility-administered medications for this encounter.  Current Outpatient Medications:  .  cephALEXin (KEFLEX) 500 MG capsule, Take 2 capsules (1,000 mg total) by mouth 2 (two) times daily for 5 days., Disp: 20 capsule, Rfl: 0 .  famotidine (PEPCID)  20 MG tablet, Take 1 tablet (20 mg total) by mouth 2 (two) times daily for 5 days., Disp: 10 tablet, Rfl: 0 .  fluconazole (DIFLUCAN) 150 MG tablet, Take 1 tablet (150 mg total) by mouth once for 1 dose. 1 tab po x 1. May repeat in 72 hours if no improvement, Disp: 2 tablet, Rfl: 1 .  ibuprofen (ADVIL) 200 MG tablet, Take 400 mg by mouth every 6 (six) hours as needed for headache or moderate pain., Disp: , Rfl:  .  levonorgestrel (MIRENA) 20 MCG/24HR IUD, 1 each by Intrauterine route once., Disp: , Rfl:  .  metroNIDAZOLE (FLAGYL) 500 MG tablet, Take 1 tablet (500 mg total) by mouth 2 (two) times daily for 7 days., Disp: 14 tablet, Rfl: 0 .  mupirocin ointment (BACTROBAN) 2 %, Apply 1  application topically 3 (three) times daily., Disp: 22 g, Rfl: 0 .  pantoprazole (PROTONIX) 20 MG tablet, Take 1 tablet (20 mg total) by mouth daily., Disp: 30 tablet, Rfl: 1 .  sucralfate (CARAFATE) 1 GM/10ML suspension, Take 10 mLs (1 g total) by mouth 4 (four) times daily -  with meals and at bedtime., Disp: 420 mL, Rfl: 0  Allergies  Allergen Reactions  . Bee Venom Anaphylaxis     ROS  As noted in HPI.   Physical Exam  BP 127/83 (BP Location: Right Arm)   Pulse 84   Temp 98.3 F (36.8 C) (Oral)   Resp 18   Wt (!) 139.3 kg   SpO2 100%   BMI 49.55 kg/m   Constitutional: Well developed, well nourished, no acute distress Eyes:  EOMI, conjunctiva normal bilaterally HENT: Normocephalic, atraumatic,mucus membranes moist Respiratory: Normal inspiratory effort Cardiovascular: Normal rate GI: nondistended soft, nontender. No suprapubic tenderness  back: No CVA tenderness GU: 2 tender pustules one on the right labia, one on the left labia- appears to be a folliculitis.  No expressible purulent drainage.  No crusting, blisters.  Normal vaginal mucosa.  Normal os.  Positive odor. No discharge.  IUD strings in place. .  Chaperone present during exam skin: No rash, skin intact Musculoskeletal: no deformities Neurologic: Alert & oriented x 3, no focal neuro deficits Psychiatric: Speech and behavior appropriate   ED Course   Medications - No data to display  Orders Placed This Encounter  Procedures  . RPR    Standing Status:   Standing    Number of Occurrences:   1  . HIV antibody    Standing Status:   Standing    Number of Occurrences:   1  . POC urine pregnancy    Standing Status:   Standing    Number of Occurrences:   1    Results for orders placed or performed during the hospital encounter of 12/13/19 (from the past 24 hour(s))  POC urine pregnancy     Status: None   Collection Time: 12/13/19 11:04 AM  Result Value Ref Range   Preg Test, Ur NEGATIVE NEGATIVE    No results found.  ED Clinical Impression  1. Folliculitis   2. Vaginal odor   3. Screening examination for STD (sexually transmitted disease)     ED Assessment/Plan  upreg neg.  1.  Folliculitis.,  Keflex 1000 mg p.o. twice daily for 5 days, Bactroban.  Advised patient to stop waxing since she states this happens frequently after getting waxed.  2.  Vaginal odor, suspect BV.  She states that the last sample that she collected was a self sample, suspect  that it may not have been an adequate sample.  Will send home with Flagyl.  Also Diflucan as patient states that Flagyl " always gives her yeast infection."  3.  STD screening. GC/chlamydia, wet prep, HIV, RPR. Will not treat empirically now. Follow-up with PMD as needed. Discussed labs, MDM, plan and followup with patient. Pt agrees with plan.   Meds ordered this encounter  Medications  . metroNIDAZOLE (FLAGYL) 500 MG tablet    Sig: Take 1 tablet (500 mg total) by mouth 2 (two) times daily for 7 days.    Dispense:  14 tablet    Refill:  0  . fluconazole (DIFLUCAN) 150 MG tablet    Sig: Take 1 tablet (150 mg total) by mouth once for 1 dose. 1 tab po x 1. May repeat in 72 hours if no improvement    Dispense:  2 tablet    Refill:  1  . mupirocin ointment (BACTROBAN) 2 %    Sig: Apply 1 application topically 3 (three) times daily.    Dispense:  22 g    Refill:  0  . cephALEXin (KEFLEX) 500 MG capsule    Sig: Take 2 capsules (1,000 mg total) by mouth 2 (two) times daily for 5 days.    Dispense:  20 capsule    Refill:  0    *This clinic note was created using [employer]. Therefore, there may be occasional mistakes despite careful proofreading.  ?     Melynda Ripple, MD 12/13/19 1130

## 2019-12-13 NOTE — ED Triage Notes (Signed)
Pt state she has vaginal irritation pt states she has 2 bumps in that area. Pt states she has went and gotten a wax as well.

## 2019-12-13 NOTE — Discharge Instructions (Addendum)
Finish the Keflex for the folliculitis.  You also may apply Bactroban to the area.  Follow-up with your doctor if it does not get any better.  Stop waxing since this seems to be a recurrent issue.  Your test will be back in several days we will contact you if they are abnormal.

## 2019-12-14 LAB — CERVICOVAGINAL ANCILLARY ONLY
Bacterial Vaginitis (gardnerella): NEGATIVE
Candida Glabrata: NEGATIVE
Candida Vaginitis: NEGATIVE
Chlamydia: NEGATIVE
Comment: NEGATIVE
Comment: NEGATIVE
Comment: NEGATIVE
Comment: NEGATIVE
Comment: NEGATIVE
Comment: NORMAL
Neisseria Gonorrhea: NEGATIVE
Trichomonas: NEGATIVE

## 2019-12-14 LAB — RPR: RPR Ser Ql: NONREACTIVE

## 2020-02-04 DIAGNOSIS — Z124 Encounter for screening for malignant neoplasm of cervix: Secondary | ICD-10-CM | POA: Diagnosis not present

## 2020-02-04 DIAGNOSIS — Z01411 Encounter for gynecological examination (general) (routine) with abnormal findings: Secondary | ICD-10-CM | POA: Diagnosis not present

## 2020-02-04 DIAGNOSIS — L739 Follicular disorder, unspecified: Secondary | ICD-10-CM | POA: Diagnosis not present

## 2020-02-04 DIAGNOSIS — Z6841 Body Mass Index (BMI) 40.0 and over, adult: Secondary | ICD-10-CM | POA: Diagnosis not present

## 2020-02-04 DIAGNOSIS — Z3040 Encounter for surveillance of contraceptives, unspecified: Secondary | ICD-10-CM | POA: Diagnosis not present

## 2020-02-04 DIAGNOSIS — N920 Excessive and frequent menstruation with regular cycle: Secondary | ICD-10-CM | POA: Diagnosis not present

## 2020-04-25 DIAGNOSIS — H5213 Myopia, bilateral: Secondary | ICD-10-CM | POA: Diagnosis not present

## 2020-05-15 DIAGNOSIS — Z202 Contact with and (suspected) exposure to infections with a predominantly sexual mode of transmission: Secondary | ICD-10-CM | POA: Diagnosis not present

## 2020-05-15 DIAGNOSIS — N390 Urinary tract infection, site not specified: Secondary | ICD-10-CM | POA: Diagnosis not present

## 2020-05-15 DIAGNOSIS — R319 Hematuria, unspecified: Secondary | ICD-10-CM | POA: Diagnosis not present

## 2020-05-18 DIAGNOSIS — E119 Type 2 diabetes mellitus without complications: Secondary | ICD-10-CM | POA: Diagnosis not present

## 2020-05-18 DIAGNOSIS — Z13220 Encounter for screening for lipoid disorders: Secondary | ICD-10-CM | POA: Diagnosis not present

## 2020-05-18 DIAGNOSIS — Z6841 Body Mass Index (BMI) 40.0 and over, adult: Secondary | ICD-10-CM | POA: Diagnosis not present

## 2020-05-18 DIAGNOSIS — E039 Hypothyroidism, unspecified: Secondary | ICD-10-CM | POA: Diagnosis not present

## 2020-05-18 DIAGNOSIS — E559 Vitamin D deficiency, unspecified: Secondary | ICD-10-CM | POA: Diagnosis not present

## 2020-05-18 DIAGNOSIS — H6123 Impacted cerumen, bilateral: Secondary | ICD-10-CM | POA: Diagnosis not present

## 2020-05-18 DIAGNOSIS — Z0000 Encounter for general adult medical examination without abnormal findings: Secondary | ICD-10-CM | POA: Diagnosis not present

## 2020-06-27 ENCOUNTER — Emergency Department (HOSPITAL_COMMUNITY): Payer: 59

## 2020-06-27 ENCOUNTER — Encounter (HOSPITAL_COMMUNITY): Payer: Self-pay | Admitting: Emergency Medicine

## 2020-06-27 ENCOUNTER — Emergency Department (HOSPITAL_COMMUNITY)
Admission: EM | Admit: 2020-06-27 | Discharge: 2020-06-27 | Disposition: A | Discharge status: Home / Self Care | Payer: 59 | Attending: Emergency Medicine | Admitting: Emergency Medicine

## 2020-06-27 ENCOUNTER — Other Ambulatory Visit: Payer: Self-pay

## 2020-06-27 DIAGNOSIS — Z20822 Contact with and (suspected) exposure to covid-19: Secondary | ICD-10-CM | POA: Insufficient documentation

## 2020-06-27 DIAGNOSIS — E119 Type 2 diabetes mellitus without complications: Secondary | ICD-10-CM | POA: Diagnosis not present

## 2020-06-27 DIAGNOSIS — Z79899 Other long term (current) drug therapy: Secondary | ICD-10-CM | POA: Insufficient documentation

## 2020-06-27 DIAGNOSIS — R059 Cough, unspecified: Secondary | ICD-10-CM | POA: Diagnosis not present

## 2020-06-27 DIAGNOSIS — R0602 Shortness of breath: Secondary | ICD-10-CM | POA: Diagnosis not present

## 2020-06-27 DIAGNOSIS — R112 Nausea with vomiting, unspecified: Secondary | ICD-10-CM | POA: Diagnosis not present

## 2020-06-27 DIAGNOSIS — I10 Essential (primary) hypertension: Secondary | ICD-10-CM | POA: Insufficient documentation

## 2020-06-27 DIAGNOSIS — R197 Diarrhea, unspecified: Secondary | ICD-10-CM | POA: Insufficient documentation

## 2020-06-27 DIAGNOSIS — R110 Nausea: Secondary | ICD-10-CM | POA: Diagnosis not present

## 2020-06-27 DIAGNOSIS — R1110 Vomiting, unspecified: Secondary | ICD-10-CM | POA: Diagnosis not present

## 2020-06-27 DIAGNOSIS — E039 Hypothyroidism, unspecified: Secondary | ICD-10-CM | POA: Insufficient documentation

## 2020-06-27 LAB — CBC WITH DIFFERENTIAL/PLATELET
Abs Immature Granulocytes: 0.05 10*3/uL (ref 0.00–0.07)
Basophils Absolute: 0 10*3/uL (ref 0.0–0.1)
Basophils Relative: 0 %
Eosinophils Absolute: 0.1 10*3/uL (ref 0.0–0.5)
Eosinophils Relative: 1 %
HCT: 37.3 % (ref 36.0–46.0)
Hemoglobin: 11.4 g/dL — ABNORMAL LOW (ref 12.0–15.0)
Immature Granulocytes: 1 %
Lymphocytes Relative: 20 %
Lymphs Abs: 1.9 10*3/uL (ref 0.7–4.0)
MCH: 24.5 pg — ABNORMAL LOW (ref 26.0–34.0)
MCHC: 30.6 g/dL (ref 30.0–36.0)
MCV: 80.2 fL (ref 80.0–100.0)
Monocytes Absolute: 0.6 10*3/uL (ref 0.1–1.0)
Monocytes Relative: 7 %
Neutro Abs: 6.7 10*3/uL (ref 1.7–7.7)
Neutrophils Relative %: 71 %
Platelets: 363 10*3/uL (ref 150–400)
RBC: 4.65 MIL/uL (ref 3.87–5.11)
RDW: 14.3 % (ref 11.5–15.5)
WBC: 9.2 10*3/uL (ref 4.0–10.5)
nRBC: 0 % (ref 0.0–0.2)

## 2020-06-27 LAB — COMPREHENSIVE METABOLIC PANEL
ALT: 14 U/L (ref 0–44)
AST: 15 U/L (ref 15–41)
Albumin: 3.7 g/dL (ref 3.5–5.0)
Alkaline Phosphatase: 53 U/L (ref 38–126)
Anion gap: 8 (ref 5–15)
BUN: 8 mg/dL (ref 6–20)
CO2: 24 mmol/L (ref 22–32)
Calcium: 8.3 mg/dL — ABNORMAL LOW (ref 8.9–10.3)
Chloride: 103 mmol/L (ref 98–111)
Creatinine, Ser: 0.66 mg/dL (ref 0.44–1.00)
GFR, Estimated: >60 mL/min (ref >60)
Glucose, Bld: 102 mg/dL — ABNORMAL HIGH (ref 70–99)
Potassium: 3.6 mmol/L (ref 3.5–5.1)
Sodium: 135 mmol/L (ref 135–145)
Total Bilirubin: 1.1 mg/dL (ref 0.3–1.2)
Total Protein: 7.6 g/dL (ref 6.5–8.1)

## 2020-06-27 LAB — RESP PANEL BY RT-PCR (FLU A&B, COVID) ARPGX2
Influenza A by PCR: NEGATIVE
Influenza B by PCR: NEGATIVE
SARS Coronavirus 2 by RT PCR: NEGATIVE

## 2020-06-27 LAB — I-STAT BETA HCG BLOOD, ED (MC, WL, AP ONLY): I-stat hCG, quantitative: <5 m[IU]/mL (ref <5)

## 2020-06-27 MED ORDER — DICYCLOMINE HCL 10 MG/ML IM SOLN
20.0000 mg | Freq: Once | INTRAMUSCULAR | Status: AC
  Administered 2020-06-27: 20 mg via INTRAMUSCULAR
  Filled 2020-06-27: qty 2

## 2020-06-27 MED ORDER — ONDANSETRON 4 MG PO TBDP: 4.0000 mg | ORAL_TABLET | Freq: Three times a day (TID) | ORAL | 0 refills | Status: DC | PRN

## 2020-06-27 MED ORDER — DICYCLOMINE HCL 20 MG PO TABS: 20.0000 mg | ORAL_TABLET | Freq: Two times a day (BID) | ORAL | 0 refills | Status: AC

## 2020-06-27 MED ORDER — SODIUM CHLORIDE 0.9 % IV BOLUS
1000.0000 mL | Freq: Once | INTRAVENOUS | Status: AC
  Administered 2020-06-27: 1000 mL via INTRAVENOUS

## 2020-06-27 MED ORDER — METOCLOPRAMIDE HCL 5 MG/ML IJ SOLN
10.0000 mg | Freq: Once | INTRAMUSCULAR | Status: AC
  Administered 2020-06-27: 10 mg via INTRAVENOUS
  Filled 2020-06-27: qty 2

## 2020-06-27 NOTE — ED Provider Notes (Signed)
[facility] DEPT Provider Note   CSN: [id number] Arrival date & time: 06/27/20  1317     History Chief Complaint  Patient presents with  . Diarrhea  . Nausea    Lorraine Chambers is a 34 y.o. female with a past medical history of diabetes, obesity, hypertension presenting to the ED with a chief complaint of diarrhea, vomiting and shortness of breath.  She has been vaccinated against Covid but did have a close exposure with her roommate who tested positive for Covid 2 days ago.  Patient started having symptoms yesterday reports fever with T-max 101.  Reports a slight cough.  Denies any chest pain.  Does have some abdominal cramping when she has the bowel movement and vomiting.  Took an antipyretic last night with improvement in her fever.  Has not taken any other medications to help with her symptoms.  She denies any urinary symptoms, chronic lung disease, hemoptysis.  HPI     Past Medical History:  Diagnosis Date  . Arthritis    left knee  . Diabetes mellitus without complication ([place])   . DM (diabetes mellitus), type 2 ([place]) 08/23/2014   last A1C 5.5 % per patient. 05/2015  . GERD (gastroesophageal reflux disease)   . Headache   . Heart murmur    ECHO last friday10/7/16 see cardiology next month Nov 2016  . Hydradenitis 02/09/2014  . Hypertension    Pt reports episode of HTN in 2009--no longer an issue 05/2015  . Hypothyroidism 08/23/2014   resolved since gastric sleeve surgery   . Iron deficiency anemia, unspecified 02/09/2014  . Obesity   . Pneumonia   . Thyroid disease     Patient Active Problem List   Diagnosis Date Noted  . Obesity 03/09/2016  . Gastric outlet obstruction 05/21/2015  . Fatty liver disease, nonalcoholic 40/81/4481  . S/P laparoscopic sleeve gastrectomy 03/29/2015  . Hypothyroidism 08/23/2014  . DM (diabetes mellitus), type 2 ([place]) 08/23/2014  . Hydradenitis 02/09/2014  . Iron deficiency anemia 02/09/2014  .  Reactive thrombocytosis 02/09/2014  . Immune to hepatitis B 09/24/2013  . Positive hepatitis C antibody test 09/24/2013  . Hepatitis C antibody test positive 09/24/2013  . HERPES SIMPLEX INFECTION, TYPE I 07/07/2008    Past Surgical History:  Procedure Laterality Date  . ESOPHAGOGASTRODUODENOSCOPY (EGD) WITH PROPOFOL N/A 05/23/2015   Procedure: ESOPHAGOGASTRODUODENOSCOPY (EGD) WITH PROPOFOL;  Surgeon: Alphonsa Overall, MD;  Location: [facility];  Service: General;  Laterality: N/A;  . LAPAROSCOPIC GASTRIC SLEEVE RESECTION N/A 03/29/2015   Procedure: LAPAROSCOPIC GASTRIC SLEEVE RESECTION WITH UPPER ENDO;  Surgeon: Greer Pickerel, MD;  Location: [facility];  Service: General;  Laterality: N/A;  . UPPER GI ENDOSCOPY  03/29/2015   Procedure: UPPER GI ENDOSCOPY;  Surgeon: Greer Pickerel, MD;  Location: [facility];  Service: General;;  . WISDOM TOOTH EXTRACTION       OB History   No obstetric history on file.     Family History  Problem Relation Age of Onset  . Heart disease Mother   . Diabetes Mother   . Hypertension Mother   . Pulmonary fibrosis Mother   . Diabetes Father   . Arthritis Father   . Other Father   . Heart murmur Sister   . Stroke Other   . Heart failure Other   . Cancer Maternal Uncle        brain ca  . Pulmonary fibrosis Maternal Grandmother   . Heart murmur Other     Social History  Tobacco Use  . Smoking status: Never Smoker  . Smokeless tobacco: Never Used  Vaping Use  . Vaping Use: Never used  Substance Use Topics  . Alcohol use: Yes    Comment: occassional, not since august   . Drug use: No    Home Medications Prior to Admission medications   Medication Sig Start Date End Date Taking? Authorizing Provider  acetaminophen (TYLENOL) 500 MG tablet Take 1,000 mg by mouth as needed (pain).   Yes [provider]  levonorgestrel (MIRENA) 20 MCG/24HR IUD 1 each by Intrauterine route once.   Yes [provider]  dicyclomine (BENTYL) 20 MG tablet Take  1 tablet (20 mg total) by mouth 2 (two) times daily. 06/27/20   Chanelle Hodsdon, PA-C  famotidine (PEPCID) 20 MG tablet Take 1 tablet (20 mg total) by mouth 2 (two) times daily for 5 days. Patient not taking: Reported on 06/27/2020 05/17/19 06/27/20  Lorayne Bender, PA-C  mupirocin ointment (BACTROBAN) 2 % Apply 1 application topically 3 (three) times daily. Patient not taking: Reported on 06/27/2020 12/13/19   Melynda Ripple, MD  ondansetron (ZOFRAN ODT) 4 MG disintegrating tablet Take 1 tablet (4 mg total) by mouth every 8 (eight) hours as needed for nausea or vomiting. 06/27/20   Ausencio Vaden, PA-C  pantoprazole (PROTONIX) 20 MG tablet Take 1 tablet (20 mg total) by mouth daily. Patient not taking: Reported on 06/27/2020 05/17/19 06/27/20  Joy, Raquel Sarna C, PA-C  sucralfate (CARAFATE) 1 GM/10ML suspension Take 10 mLs (1 g total) by mouth 4 (four) times daily -  with meals and at bedtime. Patient not taking: Reported on 06/27/2020 05/17/19   Lorayne Bender, PA-C  promethazine (PHENERGAN) 25 MG tablet Take 1 tablet (25 mg total) by mouth every 6 (six) hours as needed for nausea or vomiting. Patient not taking: Reported on 05/17/2019 01/02/19 12/13/19  Antonietta Breach, PA-C    Allergies    Bee venom  Review of Systems   Review of Systems  Constitutional: Positive for fever. Negative for appetite change and chills.  HENT: Negative for ear pain, rhinorrhea, sneezing and sore throat.   Eyes: Negative for photophobia and visual disturbance.  Respiratory: Positive for cough and shortness of breath. Negative for chest tightness and wheezing.   Cardiovascular: Negative for chest pain and palpitations.  Gastrointestinal: Positive for abdominal pain, diarrhea, nausea and vomiting. Negative for blood in stool and constipation.  Genitourinary: Negative for dysuria, hematuria and urgency.  Musculoskeletal: Negative for myalgias.  Skin: Negative for rash.  Neurological: Negative for dizziness, weakness and  light-headedness.    Physical Exam Updated Vital Signs BP (!) 152/104 (BP Location: Right Arm)   Pulse 96   Temp 99.7 F (37.6 C) (Oral)   Resp 18   Ht 5' (1.524 m)   Wt (!) 147.4 kg   SpO2 99%   BMI 63.47 kg/m   Physical Exam Vitals and nursing note reviewed.  Constitutional:      General: She is not in acute distress.    Appearance: She is well-developed. She is obese.     Comments: Speaking complete sentences without difficulty.  HENT:     Head: Normocephalic and atraumatic.     Nose: Nose normal.  Eyes:     General: No scleral icterus.       Right eye: No discharge.        Left eye: No discharge.     Conjunctiva/sclera: Conjunctivae normal.  Cardiovascular:     Rate and Rhythm: Normal  rate and regular rhythm.     Heart sounds: Normal heart sounds. No murmur heard.  No friction rub. No gallop.   Pulmonary:     Effort: Pulmonary effort is normal. No respiratory distress.     Breath sounds: Normal breath sounds.  Abdominal:     General: Bowel sounds are normal. There is no distension.     Palpations: Abdomen is soft.     Tenderness: There is no abdominal tenderness. There is no guarding.  Musculoskeletal:        General: Normal range of motion.     Cervical back: Normal range of motion and neck supple.  Skin:    General: Skin is warm and dry.     Findings: No rash.  Neurological:     Mental Status: She is alert.     Motor: No abnormal muscle tone.     Coordination: Coordination normal.     ED Results / Procedures / Treatments   Labs (all labs ordered are listed, but only abnormal results are displayed) Labs Reviewed  COMPREHENSIVE METABOLIC PANEL - Abnormal; Notable for the following components:      Result Value   Glucose, Bld 102 (*)    Calcium 8.3 (*)    All other components within normal limits  CBC WITH DIFFERENTIAL/PLATELET - Abnormal; Notable for the following components:   Hemoglobin 11.4 (*)    MCH 24.5 (*)    All other components within  normal limits  RESP PANEL BY RT-PCR (FLU A&B, COVID) ARPGX2  I-STAT BETA HCG BLOOD, ED ([facility], [facility], [facility] ONLY)    EKG None  Radiology DG Chest Portable 1 View  Result Date: 06/27/2020 CLINICAL DATA:  Cough. Diarrhea and nausea. Possible COVID-19 exposure. Vomiting. EXAM: PORTABLE CHEST 1 VIEW COMPARISON:  01/01/2019 FINDINGS: The heart size and mediastinal contours are within normal limits. Both lungs are clear. The visualized skeletal structures are unremarkable. IMPRESSION: No active disease. Electronically Signed   By: Van Clines M.D.   On: 06/27/2020 14:43    Procedures Procedures (including critical care time)  Medications Ordered in ED Medications  dicyclomine (BENTYL) injection 20 mg (has no administration in time range)  sodium chloride 0.9 % bolus 1,000 mL (1,000 mLs Intravenous New Bag/Given 06/27/20 1421)  metoCLOPramide (REGLAN) injection 10 mg (10 mg Intravenous Given 06/27/20 1421)    ED Course  I have reviewed the triage vital signs and the nursing notes.  Pertinent labs & imaging results that were available during my care of the patient were reviewed by me and considered in my medical decision making (see chart for details).  Clinical Course as of Jun 27 1505  Mon Jun 27, 2020  1437 WBC: 9.2 [HK]  1448 No acute findings.  DG Chest Portable 1 View [HK]    Clinical Course User Index [HK] Delia Heady, PA-C   MDM Rules/Calculators/A&P                          TIARIA BIBY was evaluated in Emergency Department on 06/27/20 for the symptoms described in the history of present illness. He/she was evaluated in the context of the global COVID-19 pandemic, which necessitated consideration that the patient might be at risk for infection with the SARS-CoV-2 virus that causes COVID-19. Institutional protocols and algorithms that pertain to the evaluation of patients at risk for COVID-19 are in a state of rapid change based on information released by regulatory  bodies including the CDC and  federal and state organizations. These policies and algorithms were followed during the patient's care in the ED.  34 year old female presenting to the ED for Covid exposure.  States that her roommate was diagnosed with Covid over the weekend.  She started having vomiting, abdominal cramping and diarrhea since yesterday.  Also reports a slight cough and shortness of breath.  Reports fever of T-max 101.  This improved with antipyretics yesterday.  She has been fully vaccinated against Covid.  Denies any chronic lung disease.  On exam abdomen without any focal tenderness.  Lungs are clear to auscultation bilaterally.  No signs of respiratory distress.  Will obtain lab work including Covid testing, chest x-ray and reassess.  3:04 PM CBC unremarkable.  hCG is negative.  CMP without any concerning abnormalities.  Chest x-ray without any acute findings.  Covid test is pending.  Suspect that her symptoms are viral in nature.  We will have her follow-up with Covid test results when they are available.  In the meantime we will treat symptomatically with antiemetics and antispasmodics.  Patient advised to increase hydration and slowly advance her diet as tolerated.  Advised to follow-up with her primary care provider and return for worsening symptoms.   All imaging, if done today, including plain films, CT scans, and ultrasounds, independently reviewed by me, and interpretations confirmed via formal radiology reads.  Patient is hemodynamically stable, in NAD, and able to ambulate in the ED. Evaluation does not show pathology that would require ongoing emergent intervention or inpatient treatment. I explained the diagnosis to the patient. Pain has been managed and has no complaints prior to discharge. Patient is comfortable with above plan and is stable for discharge at this time. All questions were answered prior to disposition. Strict return precautions for returning to the ED were  discussed. Encouraged follow up with PCP.   An After Visit Summary was printed and given to the patient.   Portions of this note were generated with [employer]. Dictation errors may occur despite best attempts at proofreading.  Final Clinical Impression(s) / ED Diagnoses Final diagnoses:  Exposure to COVID-19 virus  Nausea vomiting and diarrhea    Rx / DC Orders ED Discharge Orders         Ordered    ondansetron (ZOFRAN ODT) 4 MG disintegrating tablet  Every 8 hours PRN        06/27/20 1452    dicyclomine (BENTYL) 20 MG tablet  2 times daily        06/27/20 1452           Delia Heady, PA-C 06/27/20 1506    Lennice Sites, DO 06/27/20 1528

## 2020-06-27 NOTE — ED Triage Notes (Signed)
Patient states her roommate had COVID-19, she has had diarrhea and nausea currently, emesis x2 earlier. She wants to get tested for COVID.

## 2020-06-27 NOTE — Discharge Instructions (Addendum)
Take medications as prescribed as needed to help with your symptoms. Make sure you are drinking plenty of fluids to prevent dehydration. We will contact you with the results of your Covid test if it is positive. You can continue over-the-counter medications such as Tylenol or Motrin if you have fever or pain. Return to the ER if you start to experience worsening abdominal pain, chest pain, trouble breathing or trouble swallowing

## 2020-07-19 DIAGNOSIS — N760 Acute vaginitis: Secondary | ICD-10-CM | POA: Diagnosis not present

## 2020-09-20 DIAGNOSIS — Z113 Encounter for screening for infections with a predominantly sexual mode of transmission: Secondary | ICD-10-CM | POA: Diagnosis not present

## 2020-09-20 DIAGNOSIS — Z30430 Encounter for insertion of intrauterine contraceptive device: Secondary | ICD-10-CM | POA: Diagnosis not present

## 2020-09-20 DIAGNOSIS — N898 Other specified noninflammatory disorders of vagina: Secondary | ICD-10-CM | POA: Diagnosis not present

## 2020-09-20 DIAGNOSIS — Z30432 Encounter for removal of intrauterine contraceptive device: Secondary | ICD-10-CM | POA: Diagnosis not present

## 2020-10-20 ENCOUNTER — Other Ambulatory Visit (HOSPITAL_COMMUNITY): Payer: Self-pay | Admitting: Optometry

## 2020-10-20 DIAGNOSIS — H16041 Marginal corneal ulcer, right eye: Secondary | ICD-10-CM | POA: Diagnosis not present

## 2020-11-25 ENCOUNTER — Encounter (HOSPITAL_COMMUNITY): Payer: Self-pay

## 2020-11-25 ENCOUNTER — Ambulatory Visit (HOSPITAL_COMMUNITY)
Admission: EM | Admit: 2020-11-25 | Discharge: 2020-11-25 | Disposition: A | Discharge status: Home / Self Care | Payer: 59 | Attending: Physician Assistant | Admitting: Physician Assistant

## 2020-11-25 ENCOUNTER — Other Ambulatory Visit: Payer: Self-pay

## 2020-11-25 ENCOUNTER — Other Ambulatory Visit (HOSPITAL_COMMUNITY): Payer: Self-pay

## 2020-11-25 DIAGNOSIS — J40 Bronchitis, not specified as acute or chronic: Secondary | ICD-10-CM | POA: Diagnosis not present

## 2020-11-25 DIAGNOSIS — J9801 Acute bronchospasm: Secondary | ICD-10-CM | POA: Diagnosis not present

## 2020-11-25 DIAGNOSIS — R059 Cough, unspecified: Secondary | ICD-10-CM

## 2020-11-25 DIAGNOSIS — J329 Chronic sinusitis, unspecified: Secondary | ICD-10-CM

## 2020-11-25 MED ORDER — ALBUTEROL SULFATE HFA 108 (90 BASE) MCG/ACT IN AERS
1.0000 | INHALATION_SPRAY | Freq: Four times a day (QID) | RESPIRATORY_TRACT | 0 refills | Status: DC | PRN
  Filled 2020-11-25: qty 8.5, 25d supply, fill #0

## 2020-11-25 MED ORDER — CEFDINIR 300 MG PO CAPS
300.0000 mg | ORAL_CAPSULE | Freq: Two times a day (BID) | ORAL | 0 refills | Status: DC
  Filled 2020-11-25: qty 20, 10d supply, fill #0

## 2020-11-25 MED ORDER — PREDNISONE 20 MG PO TABS
40.0000 mg | ORAL_TABLET | Freq: Every day | ORAL | 0 refills | Status: AC
  Filled 2020-11-25: qty 8, 4d supply, fill #0

## 2020-11-25 NOTE — ED Triage Notes (Signed)
Pt presents with shortness of breath, non productive cough, sneezing, sore throat, and watery eyes X 4 days.

## 2020-11-25 NOTE — Discharge Instructions (Signed)
Use albuterol as needed for shortness of breath.  Take prednisone for 4 days.  Do not take any NSAIDs (aspirin, ibuprofen/Advil, naproxen/Aleve) with this medication.  Take Omnicef to 300 mg twice daily.  This can upset your stomach so take it with food.  If you have any worsening symptoms you need to be reevaluated.

## 2020-11-25 NOTE — ED Provider Notes (Signed)
[place]    CSN: [id number] Arrival date & time: 11/25/20  1319      History   Chief Complaint Chief Complaint  Patient presents with  . Cough  . Sore Throat  . Shortness of Breath    HPI Lorraine Chambers is a 35 y.o. female.   Patient presents today with a several week history of persistent cough.  She reports symptoms have worsened in the last several days with increasing shortness of breath.  Reports nasal congestion, sore throat, productive cough.  Denies any nausea, vomiting, fever, chest pain.  She denies history of asthma or COPD but has used albuterol inhaler after previous bout of pneumonia.  She does not have albuterol inhaler available but is open to refill of this.  She has tried over-the-counter medications for symptom management.  Reports symptoms began after she slipped outside was exposed to many allergens that have persisted since that time.  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  She is up-to-date on influenza and COVID-19 vaccinations.  Denies any recent antibiotic use.  She denies history of smoking.     Past Medical History:  Diagnosis Date  . Arthritis    left knee  . Diabetes mellitus without complication ([place])   . DM (diabetes mellitus), type 2 ([place]) 08/23/2014   last A1C 5.5 % per patient. 05/2015  . GERD (gastroesophageal reflux disease)   . Headache   . Heart murmur    ECHO last friday10/7/16 see cardiology next month Nov 2016  . Hydradenitis 02/09/2014  . Hypertension    Pt reports episode of HTN in 2009--no longer an issue 05/2015  . Hypothyroidism 08/23/2014   resolved since gastric sleeve surgery   . Iron deficiency anemia, unspecified 02/09/2014  . Obesity   . Pneumonia   . Thyroid disease     Patient Active Problem List   Diagnosis Date Noted  . Obesity 03/09/2016  . Gastric outlet obstruction 05/21/2015  . Fatty liver disease, nonalcoholic 0000000  . S/P laparoscopic sleeve  gastrectomy 03/29/2015  . Hypothyroidism 08/23/2014  . DM (diabetes mellitus), type 2 ([place]) 08/23/2014  . Hydradenitis 02/09/2014  . Iron deficiency anemia 02/09/2014  . Reactive thrombocytosis 02/09/2014  . Immune to hepatitis B 09/24/2013  . Positive hepatitis C antibody test 09/24/2013  . Hepatitis C antibody test positive 09/24/2013  . HERPES SIMPLEX INFECTION, TYPE I 07/07/2008    Past Surgical History:  Procedure Laterality Date  . ESOPHAGOGASTRODUODENOSCOPY (EGD) WITH PROPOFOL N/A 05/23/2015   Procedure: ESOPHAGOGASTRODUODENOSCOPY (EGD) WITH PROPOFOL;  Surgeon: Alphonsa Overall, MD;  Location: [facility];  Service: General;  Laterality: N/A;  . LAPAROSCOPIC GASTRIC SLEEVE RESECTION N/A 03/29/2015   Procedure: LAPAROSCOPIC GASTRIC SLEEVE RESECTION WITH UPPER ENDO;  Surgeon: Greer Pickerel, MD;  Location: [facility];  Service: General;  Laterality: N/A;  . UPPER GI ENDOSCOPY  03/29/2015   Procedure: UPPER GI ENDOSCOPY;  Surgeon: Greer Pickerel, MD;  Location: [facility];  Service: General;;  . WISDOM TOOTH EXTRACTION      OB History   No obstetric history on file.      Home Medications    Prior to Admission medications   Medication Sig Start Date End Date Taking? Authorizing Provider  albuterol (VENTOLIN HFA) 108 (90 Base) MCG/ACT inhaler Inhale 1-2 puffs into the lungs every 6 (six) hours as needed for wheezing or shortness of breath. 11/25/20  Yes Kandise Riehle, Geraline K, PA-C  cefdinir (OMNICEF) 300 MG capsule Take 1  capsule (300 mg total) by mouth 2 (two) times daily. 11/25/20  Yes Ashari Llewellyn, Vernesha K, PA-C  predniSONE (DELTASONE) 20 MG tablet Take 2 tablets (40 mg total) by mouth daily for 4 days. 11/25/20 11/29/20 Yes Denaya Horn, Derry Skill, PA-C  acetaminophen (TYLENOL) 500 MG tablet Take 1,000 mg by mouth as needed (pain).    [provider]  dicyclomine (BENTYL) 20 MG tablet Take 1 tablet (20 mg total) by mouth 2 (two) times daily. 06/27/20   Khatri, Hina, PA-C  famotidine (PEPCID) 20 MG tablet Take  1 tablet (20 mg total) by mouth 2 (two) times daily for 5 days. Patient not taking: Reported on 06/27/2020 05/17/19 06/27/20  Lorayne Bender, PA-C  levonorgestrel (MIRENA) 20 MCG/24HR IUD 1 each by Intrauterine route once.    [provider]  mupirocin ointment (BACTROBAN) 2 % Apply 1 application topically 3 (three) times daily. Patient not taking: Reported on 06/27/2020 12/13/19   Melynda Ripple, MD  ondansetron (ZOFRAN ODT) 4 MG disintegrating tablet Take 1 tablet (4 mg total) by mouth every 8 (eight) hours as needed for nausea or vomiting. 06/27/20   Khatri, Hina, PA-C  pantoprazole (PROTONIX) 20 MG tablet Take 1 tablet (20 mg total) by mouth daily. Patient not taking: Reported on 06/27/2020 05/17/19 06/27/20  Joy, Raquel Sarna C, PA-C  sucralfate (CARAFATE) 1 GM/10ML suspension Take 10 mLs (1 g total) by mouth 4 (four) times daily -  with meals and at bedtime. Patient not taking: Reported on 06/27/2020 05/17/19   Lorayne Bender, PA-C  promethazine (PHENERGAN) 25 MG tablet Take 1 tablet (25 mg total) by mouth every 6 (six) hours as needed for nausea or vomiting. Patient not taking: Reported on 05/17/2019 01/02/19 12/13/19  Antonietta Breach, PA-C    Family History Family History  Problem Relation Age of Onset  . Heart disease Mother   . Diabetes Mother   . Hypertension Mother   . Pulmonary fibrosis Mother   . Diabetes Father   . Arthritis Father   . Other Father   . Heart murmur Sister   . Stroke Other   . Heart failure Other   . Cancer Maternal Uncle        brain ca  . Pulmonary fibrosis Maternal Grandmother   . Heart murmur Other     Social History Social History   Tobacco Use  . Smoking status: Never Smoker  . Smokeless tobacco: Never Used  Vaping Use  . Vaping Use: Never used  Substance Use Topics  . Alcohol use: Yes    Comment: occassional, not since august   . Drug use: No     Allergies   Bee venom   Review of Systems Review of Systems  Constitutional: Positive  for activity change. Negative for appetite change, fatigue and fever.  HENT: Positive for congestion, sinus pressure and sore throat. Negative for sneezing.   Respiratory: Positive for cough and shortness of breath.   Cardiovascular: Negative for chest pain.  Gastrointestinal: Negative for abdominal pain, diarrhea, nausea and vomiting.  Musculoskeletal: Negative for arthralgias and myalgias.  Neurological: Negative for dizziness, light-headedness and headaches.     Physical Exam Triage Vital Signs ED Triage Vitals  Enc Vitals Group     BP 11/25/20 1336 (!) 147/90     Pulse Rate 11/25/20 1336 95     Resp 11/25/20 1336 20     Temp 11/25/20 1336 98.8 F (37.1 C)     Temp Source 11/25/20 1336 Oral  SpO2 11/25/20 1336 98 %     Weight --      Height --      Head Circumference --      Peak Flow --      Pain Score 11/25/20 1335 7     Pain Loc --      Pain Edu? --      Excl. in [place]? --    No data found.  Updated Vital Signs BP (!) 147/90 (BP Location: Left Arm)   Pulse 95   Temp 98.8 F (37.1 C) (Oral)   Resp 20   LMP 11/09/2020   SpO2 98%   Visual Acuity Right Eye Distance:   Left Eye Distance:   Bilateral Distance:    Right Eye Near:   Left Eye Near:    Bilateral Near:     Physical Exam Vitals reviewed.  Constitutional:      General: She is awake. She is not in acute distress.    Appearance: Normal appearance. She is not ill-appearing.     Comments: Very pleasant female appears stated age in no acute distress  HENT:     Head: Normocephalic and atraumatic.     Right Ear: Tympanic membrane, ear canal and external ear normal. Tympanic membrane is not erythematous or bulging.     Left Ear: Tympanic membrane, ear canal and external ear normal. Tympanic membrane is not erythematous or bulging.     Nose:     Right Sinus: Maxillary sinus tenderness and frontal sinus tenderness present.     Left Sinus: Maxillary sinus tenderness and frontal sinus tenderness present.      Mouth/Throat:     Pharynx: Uvula midline. No oropharyngeal exudate or posterior oropharyngeal erythema.     Comments: Drainage present posterior oropharynx Cardiovascular:     Rate and Rhythm: Normal rate and regular rhythm.     Heart sounds: No murmur heard.   Pulmonary:     Effort: Pulmonary effort is normal.     Breath sounds: Wheezing present. No rhonchi or rales.     Comments: Scattered wheezes throughout lung fields. Lymphadenopathy:     Head:     Right side of head: No submental, submandibular or tonsillar adenopathy.     Left side of head: No submental, submandibular or tonsillar adenopathy.     Cervical: No cervical adenopathy.  Psychiatric:        Behavior: Behavior is cooperative.      UC Treatments / Results  Labs (all labs ordered are listed, but only abnormal results are displayed) Labs Reviewed - No data to display  EKG   Radiology No results found.  Procedures Procedures (including critical care time)  Medications Ordered in UC Medications - No data to display  Initial Impression / Assessment and Plan / UC Course  I have reviewed the triage vital signs and the nursing notes.  Pertinent labs & imaging results that were available during my care of the patient were reviewed by me and considered in my medical decision making (see chart for details).     Vital signs and physical exam reassuring today.  Patient prescribed albuterol inhaler with instruction on how to properly use this medication during visit today.  She was prescribed short course of prednisone (40 mg x 4 days) with instruction not to take additional NSAIDs.  She reports a history of diabetes but states her last A1c was 4.8% as this resolved after having weight loss surgery several years ago.  She was encouraged  to drink plenty of fluids and avoid carbohydrates while taking prednisone.  She was prescribed Omnicef to cover for sinobronchitis.  She can use over-the-counter medications for  additional symptom relief.  Strict return precautions given to which patient expressed understanding.  Final Clinical Impressions(s) / UC Diagnoses   Final diagnoses:  Sinobronchitis  Cough  Bronchospasm     Discharge Instructions     Use albuterol as needed for shortness of breath.  Take prednisone for 4 days.  Do not take any NSAIDs (aspirin, ibuprofen/Advil, naproxen/Aleve) with this medication.  Take Omnicef to 300 mg twice daily.  This can upset your stomach so take it with food.  If you have any worsening symptoms you need to be reevaluated.    ED Prescriptions    Medication Sig Dispense Auth. Provider   albuterol (VENTOLIN HFA) 108 (90 Base) MCG/ACT inhaler Inhale 1-2 puffs into the lungs every 6 (six) hours as needed for wheezing or shortness of breath. 8 g Ritika Hellickson, Jaquilla K, PA-C   predniSONE (DELTASONE) 20 MG tablet Take 2 tablets (40 mg total) by mouth daily for 4 days. 8 tablet Santiel Topper, Genna K, PA-C   cefdinir (OMNICEF) 300 MG capsule Take 1 capsule (300 mg total) by mouth 2 (two) times daily. 20 capsule Kayton Dunaj, Derry Skill, PA-C     PDMP not reviewed this encounter.   Mumtaz, Lovins, PA-C 11/25/20 1444

## 2020-11-28 ENCOUNTER — Other Ambulatory Visit: Payer: Self-pay

## 2020-11-28 ENCOUNTER — Ambulatory Visit (HOSPITAL_COMMUNITY): Admission: EM | Admit: 2020-11-28 | Discharge: 2020-11-28 | Discharge status: Absent without leave | Payer: Self-pay

## 2020-11-28 ENCOUNTER — Ambulatory Visit (HOSPITAL_COMMUNITY)
Admission: EM | Admit: 2020-11-28 | Discharge: 2020-11-28 | Disposition: A | Discharge status: Home / Self Care | Payer: 59 | Attending: Emergency Medicine | Admitting: Emergency Medicine

## 2020-11-28 ENCOUNTER — Encounter (HOSPITAL_COMMUNITY): Payer: Self-pay

## 2020-11-28 ENCOUNTER — Ambulatory Visit (INDEPENDENT_AMBULATORY_CARE_PROVIDER_SITE_OTHER): Payer: 59

## 2020-11-28 DIAGNOSIS — R0602 Shortness of breath: Secondary | ICD-10-CM | POA: Diagnosis not present

## 2020-11-28 DIAGNOSIS — J40 Bronchitis, not specified as acute or chronic: Secondary | ICD-10-CM | POA: Diagnosis not present

## 2020-11-28 DIAGNOSIS — R059 Cough, unspecified: Secondary | ICD-10-CM | POA: Diagnosis not present

## 2020-11-28 DIAGNOSIS — R062 Wheezing: Secondary | ICD-10-CM

## 2020-11-28 LAB — POC INFLUENZA A AND B ANTIGEN (URGENT CARE ONLY)
INFLUENZA A ANTIGEN, POC: NEGATIVE
INFLUENZA B ANTIGEN, POC: NEGATIVE

## 2020-11-28 MED ORDER — IPRATROPIUM-ALBUTEROL 0.5-2.5 (3) MG/3ML IN SOLN: 3.0000 mL | Freq: Four times a day (QID) | RESPIRATORY_TRACT | 0 refills | Status: AC | PRN

## 2020-11-28 NOTE — Discharge Instructions (Addendum)
Keep taking antibiotics as prescribed.    Start using Flonase 2 sprays in each nostril daily.   Use the nebulizer as needed for shortness of breath and wheezing.    You can take Tylenol and/or Ibuprofen as needed for fever reduction and pain relief.   For sore throat: try warm salt water gargles, cepacol lozenges, throat spray, warm tea or water with lemon/honey, popsicles or ice, or OTC cold relief medicine for throat discomfort.    For congestion: take a daily anti-histamine like Zyrtec, Claritin, and a oral decongestant to help with post nasal drip that may be irritating your throat. You can also use Flonase 2 sprays in each nostril daily.    It is important to stay hydrated: drink plenty of fluids (water, gatorade/powerade/pedialyte, juices, or teas) to keep your throat moisturized and help further relieve irritation/discomfort.   Return or go to the Emergency Department if symptoms worsen or do not improve in the next few days.   Follow up with your primary care as soon as possible.

## 2020-11-28 NOTE — ED Triage Notes (Signed)
Pt presents with cough, wheezing shortness of breath x 1 week. Pt reports she was seeing 3 days ago for same symptoms and not feeling better. Reports using the albuterol inhaler 12-15 times a day without relief.   Negative COVID test 2 days ago with health at  work with [facility].   Pt requested chest x--ray.

## 2020-11-28 NOTE — ED Provider Notes (Signed)
[place]    CSN: [id number] Arrival date & time: 11/28/20  1547      History   Chief Complaint Chief Complaint  Patient presents with  . Shortness of Breath  . Cough  . Wheezing    HPI Lorraine Chambers is a 35 y.o. female.   Patient here for evaluation of shortness of breath, cough, and wheezing.  Patient was evaluated at the end of last week and diagnosed with sinusitis and bronchitis.  Patient was given antibiotics and inhaler as well as a prednisone burst.  Patient reports symptoms have not improved and wheezing has gotten worse.  Patient requesting chest x-ray as she has a history of pneumonia and hospitalization.  Patient was tested for COVID and it was negative.  Reports a fever of 102 at home over the weekend.  Denies any fevers, chest pain, N/V/D, numbness, tingling, weakness, abdominal pain, or headaches.   ROS: As per HPI, all other pertinent ROS negative   The history is provided by the patient.    Past Medical History:  Diagnosis Date  . Arthritis    left knee  . Diabetes mellitus without complication ([place])   . DM (diabetes mellitus), type 2 ([place]) 08/23/2014   last A1C 5.5 % per patient. 05/2015  . GERD (gastroesophageal reflux disease)   . Headache   . Heart murmur    ECHO last friday10/7/16 see cardiology next month Nov 2016  . Hydradenitis 02/09/2014  . Hypertension    Pt reports episode of HTN in 2009--no longer an issue 05/2015  . Hypothyroidism 08/23/2014   resolved since gastric sleeve surgery   . Iron deficiency anemia, unspecified 02/09/2014  . Obesity   . Pneumonia   . Thyroid disease     Patient Active Problem List   Diagnosis Date Noted  . Obesity 03/09/2016  . Gastric outlet obstruction 05/21/2015  . Fatty liver disease, nonalcoholic 18/56/3149  . S/P laparoscopic sleeve gastrectomy 03/29/2015  . Hypothyroidism 08/23/2014  . DM (diabetes mellitus), type 2 ([place]) 08/23/2014  . Hydradenitis 02/09/2014  . Iron deficiency  anemia 02/09/2014  . Reactive thrombocytosis 02/09/2014  . Immune to hepatitis B 09/24/2013  . Positive hepatitis C antibody test 09/24/2013  . Hepatitis C antibody test positive 09/24/2013  . HERPES SIMPLEX INFECTION, TYPE I 07/07/2008    Past Surgical History:  Procedure Laterality Date  . ESOPHAGOGASTRODUODENOSCOPY (EGD) WITH PROPOFOL N/A 05/23/2015   Procedure: ESOPHAGOGASTRODUODENOSCOPY (EGD) WITH PROPOFOL;  Surgeon: Alphonsa Overall, MD;  Location: [facility];  Service: General;  Laterality: N/A;  . LAPAROSCOPIC GASTRIC SLEEVE RESECTION N/A 03/29/2015   Procedure: LAPAROSCOPIC GASTRIC SLEEVE RESECTION WITH UPPER ENDO;  Surgeon: Greer Pickerel, MD;  Location: [facility];  Service: General;  Laterality: N/A;  . UPPER GI ENDOSCOPY  03/29/2015   Procedure: UPPER GI ENDOSCOPY;  Surgeon: Greer Pickerel, MD;  Location: [facility];  Service: General;;  . WISDOM TOOTH EXTRACTION      OB History   No obstetric history on file.      Home Medications    Prior to Admission medications   Medication Sig Start Date End Date Taking? Authorizing Provider  ipratropium-albuterol (DUONEB) 0.5-2.5 (3) MG/3ML SOLN Take 3 mLs by nebulization every 6 (six) hours as needed. 11/28/20  Yes Pearson Forster, NP  acetaminophen (TYLENOL) 500 MG tablet Take 1,000 mg by mouth as needed (pain).    [provider]  albuterol (VENTOLIN HFA) 108 (90 Base) MCG/ACT inhaler Inhale 1-2 puffs into the lungs every 6 (  six) hours as needed for wheezing or shortness of breath. 11/25/20   Raspet, Junie Panning K, PA-C  cefdinir (OMNICEF) 300 MG capsule Take 1 capsule (300 mg total) by mouth 2 (two) times daily. 11/25/20   Raspet, Derry Skill, PA-C  dicyclomine (BENTYL) 20 MG tablet Take 1 tablet (20 mg total) by mouth 2 (two) times daily. 06/27/20   Khatri, Hina, PA-C  famotidine (PEPCID) 20 MG tablet Take 1 tablet (20 mg total) by mouth 2 (two) times daily for 5 days. Patient not taking: No sig reported 05/17/19 06/27/20  Joy, Helane Gunther, PA-C   levonorgestrel (MIRENA) 20 MCG/24HR IUD 1 each by Intrauterine route once.    [provider]  mupirocin ointment (BACTROBAN) 2 % Apply 1 application topically 3 (three) times daily. Patient not taking: Reported on 06/27/2020 12/13/19   Melynda Ripple, MD  ondansetron (ZOFRAN ODT) 4 MG disintegrating tablet Take 1 tablet (4 mg total) by mouth every 8 (eight) hours as needed for nausea or vomiting. 06/27/20   Khatri, Hina, PA-C  pantoprazole (PROTONIX) 20 MG tablet Take 1 tablet (20 mg total) by mouth daily. Patient not taking: Reported on 06/27/2020 05/17/19 06/27/20  Joy, Helane Gunther, PA-C  predniSONE (DELTASONE) 20 MG tablet Take 2 tablets (40 mg total) by mouth daily for 4 days. 11/25/20 11/29/20  Raspet, Derry Skill, PA-C  sucralfate (CARAFATE) 1 GM/10ML suspension Take 10 mLs (1 g total) by mouth 4 (four) times daily -  with meals and at bedtime. Patient not taking: Reported on 06/27/2020 05/17/19   Lorayne Bender, PA-C  promethazine (PHENERGAN) 25 MG tablet Take 1 tablet (25 mg total) by mouth every 6 (six) hours as needed for nausea or vomiting. Patient not taking: Reported on 05/17/2019 01/02/19 12/13/19  Antonietta Breach, PA-C    Family History Family History  Problem Relation Age of Onset  . Heart disease Mother   . Diabetes Mother   . Hypertension Mother   . Pulmonary fibrosis Mother   . Diabetes Father   . Arthritis Father   . Other Father   . Heart murmur Sister   . Stroke Other   . Heart failure Other   . Cancer Maternal Uncle        brain ca  . Pulmonary fibrosis Maternal Grandmother   . Heart murmur Other     Social History Social History   Tobacco Use  . Smoking status: Never Smoker  . Smokeless tobacco: Never Used  Vaping Use  . Vaping Use: Never used  Substance Use Topics  . Alcohol use: Yes    Comment: occassional, not since august   . Drug use: No     Allergies   Bee venom   Review of Systems Review of Systems  HENT: Positive for sore throat.    Respiratory: Positive for cough, shortness of breath and wheezing.   All other systems reviewed and are negative.    Physical Exam Triage Vital Signs ED Triage Vitals [11/28/20 1621]  Enc Vitals Group     BP (!) 149/103     Pulse Rate 87     Resp      Temp 98.6 F (37 C)     Temp Source Oral     SpO2 96 %     Weight      Height      Head Circumference      Peak Flow      Pain Score      Pain Loc  Pain Edu?      Excl. in [place]?    No data found.  Updated Vital Signs BP (!) 149/103 (BP Location: Right Wrist)   Pulse 87   Temp 98.6 F (37 C) (Oral)   LMP 11/09/2020   SpO2 96%   Visual Acuity Right Eye Distance:   Left Eye Distance:   Bilateral Distance:    Right Eye Near:   Left Eye Near:    Bilateral Near:     Physical Exam Vitals and nursing note reviewed.  Constitutional:      General: She is not in acute distress.    Appearance: Normal appearance. She is not ill-appearing, toxic-appearing or diaphoretic.  HENT:     Head: Normocephalic and atraumatic.     Mouth/Throat:     Pharynx: No oropharyngeal exudate.  Eyes:     Conjunctiva/sclera: Conjunctivae normal.     Pupils: Pupils are equal, round, and reactive to light.  Cardiovascular:     Rate and Rhythm: Normal rate and regular rhythm.     Pulses: Normal pulses.  Pulmonary:     Effort: Pulmonary effort is normal. No accessory muscle usage or respiratory distress.     Breath sounds: No stridor. Examination of the right-upper field reveals wheezing. Examination of the left-upper field reveals wheezing. Examination of the right-middle field reveals wheezing. Examination of the right-lower field reveals wheezing. Examination of the left-lower field reveals wheezing. Wheezing (expiratory ) present.  Chest:     Chest wall: No mass, deformity or tenderness.  Abdominal:     General: Abdomen is flat.     Palpations: Abdomen is soft.  Musculoskeletal:        General: Normal range of motion.     Cervical  back: Normal range of motion.  Skin:    General: Skin is warm and dry.  Neurological:     General: No focal deficit present.     Mental Status: She is alert and oriented to person, place, and time.  Psychiatric:        Mood and Affect: Mood normal.      UC Treatments / Results  Labs (all labs ordered are listed, but only abnormal results are displayed) Labs Reviewed  POC INFLUENZA A AND B ANTIGEN (URGENT CARE ONLY)    EKG   Radiology DG Chest 2 View  Result Date: 11/28/2020 CLINICAL DATA:  Cough and shortness of breath. EXAM: CHEST - 2 VIEW COMPARISON:  June 27, 2020. FINDINGS: The heart size and mediastinal contours are within normal limits. No focal consolidation. Mild bronchial thickening. No pleural effusion. No pneumothorax. The visualized skeletal structures are unremarkable. IMPRESSION: Mild bronchial thickening as can be seen with bronchitis. No focal consolidation. Electronically Signed   By: Dahlia Bailiff MD   On: 11/28/2020 17:25    Procedures Procedures (including critical care time)  Medications Ordered in UC Medications - No data to display  Initial Impression / Assessment and Plan / UC Course  I have reviewed the triage vital signs and the nursing notes.  Pertinent labs & imaging results that were available during my care of the patient were reviewed by me and considered in my medical decision making (see chart for details).    Assessment negative for red flags or concerns.  X-ray with mild bronchial thickening but no signs of pneumonia.  Continue taking antibiotics as prescribed from previous visit.  Consider adding in Flonase 2 sprays in each nostril daily.  DuoNeb added as needed for shortness of breath  and wheezing.  Conservative symptom management discussed as described below in discharge instructions .  Recommend following up with primary care for reevaluation and possible long-term management of shortness of breath.  Final Clinical Impressions(s) /  UC Diagnoses   Final diagnoses:  Bronchitis     Discharge Instructions     Keep taking antibiotics as prescribed.    Start using Flonase 2 sprays in each nostril daily.   Use the nebulizer as needed for shortness of breath and wheezing.    You can take Tylenol and/or Ibuprofen as needed for fever reduction and pain relief.   For sore throat: try warm salt water gargles, cepacol lozenges, throat spray, warm tea or water with lemon/honey, popsicles or ice, or OTC cold relief medicine for throat discomfort.    For congestion: take a daily anti-histamine like Zyrtec, Claritin, and a oral decongestant to help with post nasal drip that may be irritating your throat. You can also use Flonase 2 sprays in each nostril daily.    It is important to stay hydrated: drink plenty of fluids (water, gatorade/powerade/pedialyte, juices, or teas) to keep your throat moisturized and help further relieve irritation/discomfort.   Return or go to the Emergency Department if symptoms worsen or do not improve in the next few days.   Follow up with your primary care as soon as possible.       ED Prescriptions    Medication Sig Dispense Auth. Provider   ipratropium-albuterol (DUONEB) 0.5-2.5 (3) MG/3ML SOLN Take 3 mLs by nebulization every 6 (six) hours as needed. 360 mL Pearson Forster, NP     PDMP not reviewed this encounter.   Pearson Forster, NP 11/28/20 [phone]

## 2021-04-28 ENCOUNTER — Other Ambulatory Visit (HOSPITAL_COMMUNITY): Payer: Self-pay

## 2021-04-28 ENCOUNTER — Ambulatory Visit (HOSPITAL_COMMUNITY)
Admission: EM | Admit: 2021-04-28 | Discharge: 2021-04-28 | Disposition: A | Discharge status: Home / Self Care | Payer: 59 | Attending: Student | Admitting: Student

## 2021-04-28 ENCOUNTER — Other Ambulatory Visit: Payer: Self-pay

## 2021-04-28 ENCOUNTER — Encounter (HOSPITAL_COMMUNITY): Payer: Self-pay

## 2021-04-28 DIAGNOSIS — Z975 Presence of (intrauterine) contraceptive device: Secondary | ICD-10-CM | POA: Insufficient documentation

## 2021-04-28 DIAGNOSIS — N939 Abnormal uterine and vaginal bleeding, unspecified: Secondary | ICD-10-CM | POA: Insufficient documentation

## 2021-04-28 DIAGNOSIS — J4520 Mild intermittent asthma, uncomplicated: Secondary | ICD-10-CM | POA: Insufficient documentation

## 2021-04-28 DIAGNOSIS — Z760 Encounter for issue of repeat prescription: Secondary | ICD-10-CM | POA: Insufficient documentation

## 2021-04-28 MED ORDER — ALBUTEROL SULFATE HFA 108 (90 BASE) MCG/ACT IN AERS
1.0000 | INHALATION_SPRAY | Freq: Four times a day (QID) | RESPIRATORY_TRACT | 0 refills | Status: DC | PRN
  Filled 2021-04-28: qty 8.5, 25d supply, fill #0

## 2021-04-28 NOTE — Discharge Instructions (Addendum)
-  We're screening for BV, yeast, gonorrhea, chlamydia, trichomonas. We'll call if anything positive in 2-3 days and can send treatment to your pharmacy.  -Follow-up with gyn as scheduled in October, or sooner if symptoms worsen

## 2021-04-28 NOTE — ED Triage Notes (Signed)
Pt presents with abnormal vaginal bleeding X 4 months after having her IUD placed in April.

## 2021-04-28 NOTE — ED Provider Notes (Addendum)
[place]    CSN: [id number] Arrival date & time: 04/28/21  5465      History   Chief Complaint Chief Complaint  Patient presents with   Vaginal Bleeding    HPI Lorraine Chambers is a 35 y.o. female presenting with vaginal spotting for about 4 months since Mirena IUD was inserted.  Medical history noncontributory.  States that she frequently has intermittent spotting, in between her periods.  Also does endorse some white discharge occasionally with odor but not currently.  Denies urinary symptoms.  Denies new partners or STI exposure.  Denies abdominal pain.  States she has an appointment with her gynecologist in about 3 weeks but she cannot wait until then.  HPI  Past Medical History:  Diagnosis Date   Arthritis    left knee   Diabetes mellitus without complication ([place])    DM (diabetes mellitus), type 2 ([place]) 08/23/2014   last A1C 5.5 % per patient. 05/2015   GERD (gastroesophageal reflux disease)    Headache    Heart murmur    ECHO last friday10/7/16 see cardiology next month Nov 2016   Hydradenitis 02/09/2014   Hypertension    Pt reports episode of HTN in 2009--no longer an issue 05/2015   Hypothyroidism 08/23/2014   resolved since gastric sleeve surgery    Iron deficiency anemia, unspecified 02/09/2014   Obesity    Pneumonia    Thyroid disease     Patient Active Problem List   Diagnosis Date Noted   Obesity 03/09/2016   Gastric outlet obstruction 05/21/2015   Fatty liver disease, nonalcoholic 03/54/6568   S/P laparoscopic sleeve gastrectomy 03/29/2015   Hypothyroidism 08/23/2014   DM (diabetes mellitus), type 2 ([place]) 08/23/2014   Hydradenitis 02/09/2014   Iron deficiency anemia 02/09/2014   Reactive thrombocytosis 02/09/2014   Immune to hepatitis B 09/24/2013   Positive hepatitis C antibody test 09/24/2013   Hepatitis C antibody test positive 09/24/2013   HERPES SIMPLEX INFECTION, TYPE I 07/07/2008    Past Surgical History:  Procedure  Laterality Date   ESOPHAGOGASTRODUODENOSCOPY (EGD) WITH PROPOFOL N/A 05/23/2015   Procedure: ESOPHAGOGASTRODUODENOSCOPY (EGD) WITH PROPOFOL;  Surgeon: Alphonsa Overall, MD;  Location: [facility];  Service: General;  Laterality: N/A;   LAPAROSCOPIC GASTRIC SLEEVE RESECTION N/A 03/29/2015   Procedure: LAPAROSCOPIC GASTRIC SLEEVE RESECTION WITH UPPER ENDO;  Surgeon: Greer Pickerel, MD;  Location: [facility];  Service: General;  Laterality: N/A;   UPPER GI ENDOSCOPY  03/29/2015   Procedure: UPPER GI ENDOSCOPY;  Surgeon: Greer Pickerel, MD;  Location: [facility];  Service: General;;   WISDOM TOOTH EXTRACTION      OB History   No obstetric history on file.      Home Medications    Prior to Admission medications   Medication Sig Start Date End Date Taking? Authorizing Provider  albuterol (VENTOLIN HFA) 108 (90 Base) MCG/ACT inhaler Inhale 1-2 puffs into the lungs every 6 (six) hours as needed for wheezing or shortness of breath. 04/28/21  Yes Hazel Sams, PA-C  acetaminophen (TYLENOL) 500 MG tablet Take 1,000 mg by mouth as needed (pain).    [provider]  dicyclomine (BENTYL) 20 MG tablet Take 1 tablet (20 mg total) by mouth 2 (two) times daily. 06/27/20   Khatri, Hina, PA-C  famotidine (PEPCID) 20 MG tablet Take 1 tablet (20 mg total) by mouth 2 (two) times daily for 5 days. Patient not taking: No sig reported 05/17/19 06/27/20  Joy, Shawn C, PA-C  ipratropium-albuterol (DUONEB) 0.5-2.5 (  3) MG/3ML SOLN Take 3 mLs by nebulization every 6 (six) hours as needed. 11/28/20   Pearson Forster, NP  levonorgestrel (MIRENA) 20 MCG/24HR IUD 1 each by Intrauterine route once.    [provider]  mupirocin ointment (BACTROBAN) 2 % Apply 1 application topically 3 (three) times daily. Patient not taking: Reported on 06/27/2020 12/13/19   Melynda Ripple, MD  ondansetron (ZOFRAN ODT) 4 MG disintegrating tablet Take 1 tablet (4 mg total) by mouth every 8 (eight) hours as needed for nausea or vomiting.  06/27/20   Khatri, Hina, PA-C  pantoprazole (PROTONIX) 20 MG tablet Take 1 tablet (20 mg total) by mouth daily. Patient not taking: Reported on 06/27/2020 05/17/19 06/27/20  Joy, Raquel Sarna C, PA-C  sucralfate (CARAFATE) 1 GM/10ML suspension Take 10 mLs (1 g total) by mouth 4 (four) times daily -  with meals and at bedtime. Patient not taking: Reported on 06/27/2020 05/17/19   Lorayne Bender, PA-C  promethazine (PHENERGAN) 25 MG tablet Take 1 tablet (25 mg total) by mouth every 6 (six) hours as needed for nausea or vomiting. Patient not taking: Reported on 05/17/2019 01/02/19 12/13/19  Antonietta Breach, PA-C    Family History Family History  Problem Relation Age of Onset   Heart disease Mother    Diabetes Mother    Hypertension Mother    Pulmonary fibrosis Mother    Diabetes Father    Arthritis Father    Other Father    Heart murmur Sister    Stroke Other    Heart failure Other    Cancer Maternal Uncle        brain ca   Pulmonary fibrosis Maternal Grandmother    Heart murmur Other     Social History Social History   Tobacco Use   Smoking status: Never   Smokeless tobacco: Never  Vaping Use   Vaping Use: Never used  Substance Use Topics   Alcohol use: Yes    Comment: occassional, not since august    Drug use: No     Allergies   Bee venom   Review of Systems Review of Systems  Constitutional:  Negative for appetite change, chills, diaphoresis and fever.  Respiratory:  Negative for shortness of breath.   Cardiovascular:  Negative for chest pain.  Gastrointestinal:  Negative for abdominal pain, blood in stool, constipation, diarrhea, nausea and vomiting.  Genitourinary:  Positive for menstrual problem and vaginal bleeding. Negative for decreased urine volume, difficulty urinating, dysuria, flank pain, frequency, genital sores, hematuria and urgency.  Musculoskeletal:  Negative for back pain.  Neurological:  Negative for dizziness, weakness and light-headedness.  All other  systems reviewed and are negative.   Physical Exam Triage Vital Signs ED Triage Vitals  Enc Vitals Group     BP 04/28/21 1027 (!) 148/94     Pulse Rate 04/28/21 1027 71     Resp 04/28/21 1027 18     Temp 04/28/21 1027 98.3 F (36.8 C)     Temp Source 04/28/21 1027 Oral     SpO2 04/28/21 1027 100 %     Weight --      Height --      Head Circumference --      Peak Flow --      Pain Score 04/28/21 1033 0     Pain Loc --      Pain Edu? --      Excl. in [place]? --    No data found.  Updated Vital Signs BP Marland Kitchen)  148/94 (BP Location: Left Arm)   Pulse 71   Temp 98.3 F (36.8 C) (Oral)   Resp 18   SpO2 100%   Visual Acuity Right Eye Distance:   Left Eye Distance:   Bilateral Distance:    Right Eye Near:   Left Eye Near:    Bilateral Near:     Physical Exam Vitals reviewed.  Constitutional:      General: She is not in acute distress.    Appearance: Normal appearance. She is not ill-appearing.  HENT:     Head: Normocephalic and atraumatic.     Mouth/Throat:     Mouth: Mucous membranes are moist.     Comments: Moist mucous membranes Eyes:     Extraocular Movements: Extraocular movements intact.     Pupils: Pupils are equal, round, and reactive to light.  Cardiovascular:     Rate and Rhythm: Normal rate and regular rhythm.     Heart sounds: Normal heart sounds.  Pulmonary:     Effort: Pulmonary effort is normal.     Breath sounds: Normal breath sounds. No wheezing, rhonchi or rales.  Abdominal:     General: Bowel sounds are normal. There is no distension.     Palpations: Abdomen is soft. There is no mass.     Tenderness: There is no abdominal tenderness. There is no right CVA tenderness, left CVA tenderness, guarding or rebound.  Genitourinary:    Comments: deferred Skin:    General: Skin is warm.     Capillary Refill: Capillary refill takes less than 2 seconds.     Comments: Good skin turgor  Neurological:     General: No focal deficit present.     Mental  Status: She is alert and oriented to person, place, and time.  Psychiatric:        Mood and Affect: Mood normal.        Behavior: Behavior normal.     UC Treatments / Results  Labs (all labs ordered are listed, but only abnormal results are displayed) Labs Reviewed  CERVICOVAGINAL ANCILLARY ONLY    EKG   Radiology No results found.  Procedures Procedures (including critical care time)  Medications Ordered in UC Medications - No data to display  Initial Impression / Assessment and Plan / UC Course  I have reviewed the triage vital signs and the nursing notes.  Pertinent labs & imaging results that were available during my care of the patient were reviewed by me and considered in my medical decision making (see chart for details).     This patient is a very pleasant 35 y.o. year old female presenting with vaginal spotting following Mirena IUD insertion x4 months. Denies STI risk. Afebrile, nontachycardic, no reproducible abd pain or CVAT.  Last STI panel normal 12/2019. Denies STI risk or new partners. Will send self-swab for G/C, trich, yeast, BV testing. Declines HIV, RPR. Safe sex precautions.   UA deferred given lack of urinary symptoms.   Ddx includes BV. If testing today is normal she will f/u with gyn in 3 weeks for possible IUD removal.   Mild intermittent asthma. Requesting refill of albuterol inhaler, no current pulm symptoms or issues.  ED return precautions discussed. Patient verbalizes understanding and agreement.   Coding Level 4 for review of past notes/labs, order and interpretation of labs today, and prescription drug management   Final Clinical Impressions(s) / UC Diagnoses   Final diagnoses:  Vaginal bleeding  IUD contraception  Mild intermittent asthma without complication  Medication refill     Discharge Instructions      -We're screening for BV, yeast, gonorrhea, chlamydia, trichomonas. We'll call if anything positive in 2-3 days and can  send treatment to your pharmacy.  -Follow-up with gyn as scheduled in October, or sooner if symptoms worsen   ED Prescriptions     Medication Sig Dispense Auth. Provider   albuterol (VENTOLIN HFA) 108 (90 Base) MCG/ACT inhaler Inhale 1-2 puffs into the lungs every 6 (six) hours as needed for wheezing or shortness of breath. 1 each Hazel Sams, PA-C      PDMP not reviewed this encounter.   Hazel Sams, PA-C 04/28/21 1106    Hazel Sams, PA-C 04/28/21 1106

## 2021-05-01 ENCOUNTER — Telehealth (HOSPITAL_COMMUNITY): Payer: Self-pay | Admitting: Emergency Medicine

## 2021-05-01 ENCOUNTER — Other Ambulatory Visit (HOSPITAL_COMMUNITY): Payer: Self-pay

## 2021-05-01 LAB — CERVICOVAGINAL ANCILLARY ONLY
Bacterial Vaginitis (gardnerella): POSITIVE — AB
Candida Glabrata: NEGATIVE
Candida Vaginitis: NEGATIVE
Chlamydia: NEGATIVE
Comment: NEGATIVE
Comment: NEGATIVE
Comment: NEGATIVE
Comment: NEGATIVE
Comment: NEGATIVE
Comment: NORMAL
Neisseria Gonorrhea: NEGATIVE
Trichomonas: NEGATIVE

## 2021-05-01 MED ORDER — METRONIDAZOLE 500 MG PO TABS
500.0000 mg | ORAL_TABLET | Freq: Two times a day (BID) | ORAL | 0 refills | Status: DC
  Filled 2021-05-01: qty 14, 7d supply, fill #0

## 2021-05-01 MED ORDER — FLUCONAZOLE 150 MG PO TABS
150.0000 mg | ORAL_TABLET | ORAL | 0 refills | Status: DC
  Filled 2021-05-01: qty 2, 3d supply, fill #0

## 2021-06-15 DIAGNOSIS — N926 Irregular menstruation, unspecified: Secondary | ICD-10-CM | POA: Diagnosis not present

## 2021-06-15 DIAGNOSIS — N9412 Deep dyspareunia: Secondary | ICD-10-CM | POA: Diagnosis not present

## 2021-06-15 DIAGNOSIS — Z30431 Encounter for routine checking of intrauterine contraceptive device: Secondary | ICD-10-CM | POA: Diagnosis not present

## 2021-06-15 DIAGNOSIS — Z3040 Encounter for surveillance of contraceptives, unspecified: Secondary | ICD-10-CM | POA: Diagnosis not present

## 2021-06-15 DIAGNOSIS — Z6841 Body Mass Index (BMI) 40.0 and over, adult: Secondary | ICD-10-CM | POA: Diagnosis not present

## 2021-06-15 DIAGNOSIS — Z113 Encounter for screening for infections with a predominantly sexual mode of transmission: Secondary | ICD-10-CM | POA: Diagnosis not present

## 2021-06-15 DIAGNOSIS — Z01411 Encounter for gynecological examination (general) (routine) with abnormal findings: Secondary | ICD-10-CM | POA: Diagnosis not present

## 2021-07-14 ENCOUNTER — Other Ambulatory Visit (HOSPITAL_COMMUNITY): Payer: Self-pay

## 2021-07-14 DIAGNOSIS — R509 Fever, unspecified: Secondary | ICD-10-CM | POA: Diagnosis not present

## 2021-07-14 DIAGNOSIS — Z03818 Encounter for observation for suspected exposure to other biological agents ruled out: Secondary | ICD-10-CM | POA: Diagnosis not present

## 2021-07-14 DIAGNOSIS — J111 Influenza due to unidentified influenza virus with other respiratory manifestations: Secondary | ICD-10-CM | POA: Diagnosis not present

## 2021-07-14 DIAGNOSIS — R059 Cough, unspecified: Secondary | ICD-10-CM | POA: Diagnosis not present

## 2021-07-14 DIAGNOSIS — R5383 Other fatigue: Secondary | ICD-10-CM | POA: Diagnosis not present

## 2021-07-14 MED ORDER — IPRATROPIUM BROMIDE 0.06 % NA SOLN
2.0000 | Freq: Three times a day (TID) | NASAL | 0 refills | Status: DC
  Filled 2021-07-14: qty 15, 14d supply, fill #0

## 2021-07-14 MED ORDER — ALBUTEROL SULFATE HFA 108 (90 BASE) MCG/ACT IN AERS
1.0000 | INHALATION_SPRAY | RESPIRATORY_TRACT | 0 refills | Status: DC
  Filled 2021-07-14: qty 8.5, 30d supply, fill #0

## 2021-07-16 DIAGNOSIS — J069 Acute upper respiratory infection, unspecified: Secondary | ICD-10-CM | POA: Diagnosis not present

## 2021-07-17 ENCOUNTER — Other Ambulatory Visit (HOSPITAL_COMMUNITY): Payer: Self-pay

## 2021-07-17 DIAGNOSIS — H5213 Myopia, bilateral: Secondary | ICD-10-CM | POA: Diagnosis not present

## 2021-07-17 MED ORDER — OSELTAMIVIR PHOSPHATE 75 MG PO CAPS
75.0000 mg | ORAL_CAPSULE | Freq: Two times a day (BID) | ORAL | 0 refills | Status: DC
  Filled 2021-07-17: qty 10, 5d supply, fill #0

## 2021-07-25 ENCOUNTER — Other Ambulatory Visit (HOSPITAL_COMMUNITY): Payer: Self-pay

## 2021-08-04 ENCOUNTER — Encounter (HOSPITAL_COMMUNITY): Payer: Self-pay

## 2021-08-04 ENCOUNTER — Ambulatory Visit (HOSPITAL_COMMUNITY)
Admission: EM | Admit: 2021-08-04 | Discharge: 2021-08-04 | Disposition: A | Discharge status: Home / Self Care | Payer: 59 | Attending: Physician Assistant | Admitting: Physician Assistant

## 2021-08-04 ENCOUNTER — Other Ambulatory Visit: Payer: Self-pay

## 2021-08-04 ENCOUNTER — Other Ambulatory Visit (HOSPITAL_COMMUNITY): Payer: Self-pay

## 2021-08-04 DIAGNOSIS — J069 Acute upper respiratory infection, unspecified: Secondary | ICD-10-CM | POA: Diagnosis not present

## 2021-08-04 MED ORDER — ALBUTEROL SULFATE HFA 108 (90 BASE) MCG/ACT IN AERS
2.0000 | INHALATION_SPRAY | Freq: Four times a day (QID) | RESPIRATORY_TRACT | 2 refills | Status: DC | PRN
  Filled 2021-08-04: qty 8, fill #0
  Filled 2021-08-09: qty 8.5, 25d supply, fill #0

## 2021-08-04 MED ORDER — PREDNISONE 50 MG PO TABS
50.0000 mg | ORAL_TABLET | Freq: Every day | ORAL | 0 refills | Status: DC
  Filled 2021-08-04: qty 5, 5d supply, fill #0

## 2021-08-04 NOTE — Discharge Instructions (Signed)
Return if any problems.

## 2021-08-04 NOTE — ED Triage Notes (Signed)
Pt presents with non productive cough, congestion, wheezing, and chest tightness for past few days that has been unrelieved with OTC medication.

## 2021-08-04 NOTE — ED Provider Notes (Signed)
[name]    CSN: [id number] Arrival date & time: 08/04/21  0805      History   Chief Complaint Chief Complaint  Patient presents with   URI    HPI Lorraine Chambers is a 35 y.o. female.   Pt complains of cough and congestion.  Pt has history of the same 3 weeks ago.  Pt is out of albuterol   The history is provided by the patient. No language interpreter was used.  URI Presenting symptoms: cough   Severity:  Moderate Onset quality:  Gradual Duration:  3 days Timing:  Constant Progression:  Worsening Chronicity:  New Worsened by:  Nothing Ineffective treatments:  None tried Risk factors: sick contacts    Past Medical History:  Diagnosis Date   Arthritis    left knee   Diabetes mellitus without complication ([place])    DM (diabetes mellitus), type 2 ([place]) 08/23/2014   last A1C 5.5 % per patient. 05/2015   GERD (gastroesophageal reflux disease)    Headache    Heart murmur    ECHO last friday10/7/16 see cardiology next month Nov 2016   Hydradenitis 02/09/2014   Hypertension    Pt reports episode of HTN in 2009--no longer an issue 05/2015   Hypothyroidism 08/23/2014   resolved since gastric sleeve surgery    Iron deficiency anemia, unspecified 02/09/2014   Obesity    Pneumonia    Thyroid disease     Patient Active Problem List   Diagnosis Date Noted   Obesity 03/09/2016   Gastric outlet obstruction 05/21/2015   Fatty liver disease, nonalcoholic 00/93/8182   S/P laparoscopic sleeve gastrectomy 03/29/2015   Hypothyroidism 08/23/2014   DM (diabetes mellitus), type 2 ([place]) 08/23/2014   Hydradenitis 02/09/2014   Iron deficiency anemia 02/09/2014   Reactive thrombocytosis 02/09/2014   Immune to hepatitis B 09/24/2013   Positive hepatitis C antibody test 09/24/2013   Hepatitis C antibody test positive 09/24/2013   HERPES SIMPLEX INFECTION, TYPE I 07/07/2008    Past Surgical History:  Procedure Laterality Date   ESOPHAGOGASTRODUODENOSCOPY (EGD)  WITH PROPOFOL N/A 05/23/2015   Procedure: ESOPHAGOGASTRODUODENOSCOPY (EGD) WITH PROPOFOL;  Surgeon: Alphonsa Overall, MD;  Location: [facility];  Service: General;  Laterality: N/A;   LAPAROSCOPIC GASTRIC SLEEVE RESECTION N/A 03/29/2015   Procedure: LAPAROSCOPIC GASTRIC SLEEVE RESECTION WITH UPPER ENDO;  Surgeon: Greer Pickerel, MD;  Location: [facility];  Service: General;  Laterality: N/A;   UPPER GI ENDOSCOPY  03/29/2015   Procedure: UPPER GI ENDOSCOPY;  Surgeon: Greer Pickerel, MD;  Location: [facility];  Service: General;;   WISDOM TOOTH EXTRACTION      OB History   No obstetric history on file.      Home Medications    Prior to Admission medications   Medication Sig Start Date End Date Taking? Authorizing Provider  albuterol (VENTOLIN HFA) 108 (90 Base) MCG/ACT inhaler Inhale 2 puffs into the lungs every 6 (six) hours as needed for wheezing or shortness of breath. 08/04/21  Yes Caryl Ada K, PA-C  predniSONE (DELTASONE) 50 MG tablet Take 1 tablet (50 mg total) by mouth daily. 08/04/21  Yes Caryl Ada K, PA-C  acetaminophen (TYLENOL) 500 MG tablet Take 1,000 mg by mouth as needed (pain).    [provider]  dicyclomine (BENTYL) 20 MG tablet Take 1 tablet (20 mg total) by mouth 2 (two) times daily. 06/27/20   Khatri, Hina, PA-C  famotidine (PEPCID) 20 MG tablet Take 1 tablet (20 mg total) by mouth 2 (  two) times daily for 5 days. Patient not taking: No sig reported 05/17/19 06/27/20  Joy, Raquel Sarna C, PA-C  fluconazole (DIFLUCAN) 150 MG tablet Take 1 tablet (150 mg total) by mouth. Repeat in 3 days if symptoms persist 05/01/21   Chase Picket, MD  ipratropium (ATROVENT) 0.06 % nasal spray Place 2 sprays into both nostrils 3 (three) times daily for 4 days 07/14/21     ipratropium-albuterol (DUONEB) 0.5-2.5 (3) MG/3ML SOLN Take 3 mLs by nebulization every 6 (six) hours as needed. 11/28/20   Pearson Forster, NP  levonorgestrel (MIRENA) 20 MCG/24HR IUD 1 each by Intrauterine route once.     [provider]  metroNIDAZOLE (FLAGYL) 500 MG tablet Take 1 tablet (500 mg total) by mouth 2 (two) times daily. 05/01/21   LampteyMyrene Galas, MD  mupirocin ointment (BACTROBAN) 2 % Apply 1 application topically 3 (three) times daily. Patient not taking: Reported on 06/27/2020 12/13/19   Melynda Ripple, MD  ondansetron (ZOFRAN ODT) 4 MG disintegrating tablet Take 1 tablet (4 mg total) by mouth every 8 (eight) hours as needed for nausea or vomiting. 06/27/20   Khatri, Hina, PA-C  oseltamivir (TAMIFLU) 75 MG capsule Take 1 capsule (75 mg total) by mouth 2 (two) times daily. 07/14/21     pantoprazole (PROTONIX) 20 MG tablet Take 1 tablet (20 mg total) by mouth daily. Patient not taking: Reported on 06/27/2020 05/17/19 06/27/20  Joy, Raquel Sarna C, PA-C  sucralfate (CARAFATE) 1 GM/10ML suspension Take 10 mLs (1 g total) by mouth 4 (four) times daily -  with meals and at bedtime. Patient not taking: Reported on 06/27/2020 05/17/19   Lorayne Bender, PA-C  promethazine (PHENERGAN) 25 MG tablet Take 1 tablet (25 mg total) by mouth every 6 (six) hours as needed for nausea or vomiting. Patient not taking: Reported on 05/17/2019 01/02/19 12/13/19  Antonietta Breach, PA-C    Family History Family History  Problem Relation Age of Onset   Heart disease Mother    Diabetes Mother    Hypertension Mother    Pulmonary fibrosis Mother    Diabetes Father    Arthritis Father    Other Father    Heart murmur Sister    Stroke Other    Heart failure Other    Cancer Maternal Uncle        brain ca   Pulmonary fibrosis Maternal Grandmother    Heart murmur Other     Social History Social History   Tobacco Use   Smoking status: Never   Smokeless tobacco: Never  Vaping Use   Vaping Use: Never used  Substance Use Topics   Alcohol use: Yes    Comment: occassional, not since august    Drug use: No     Allergies   Bee venom   Review of Systems Review of Systems  Respiratory:  Positive for cough.   All  other systems reviewed and are negative.   Physical Exam Triage Vital Signs ED Triage Vitals  Enc Vitals Group     BP 08/04/21 0909 (!) 153/97     Pulse Rate 08/04/21 0909 100     Resp 08/04/21 0909 20     Temp 08/04/21 0909 (!) 100.5 F (38.1 C)     Temp Source 08/04/21 0909 Oral     SpO2 08/04/21 0909 100 %     Weight --      Height --      Head Circumference --      Peak Flow --  Pain Score 08/04/21 0915 3     Pain Loc --      Pain Edu? --      Excl. in [place]? --    No data found.  Updated Vital Signs BP (!) 153/97 (BP Location: Left Arm)    Pulse 100    Temp (!) 100.5 F (38.1 C) (Oral)    Resp 20    SpO2 100%   Visual Acuity Right Eye Distance:   Left Eye Distance:   Bilateral Distance:    Right Eye Near:   Left Eye Near:    Bilateral Near:     Physical Exam Vitals and nursing note reviewed.  Constitutional:      Appearance: She is well-developed.  HENT:     Head: Normocephalic.     Nose: Nose normal.     Mouth/Throat:     Mouth: Mucous membranes are moist.  Cardiovascular:     Rate and Rhythm: Normal rate.  Pulmonary:     Effort: Pulmonary effort is normal.     Breath sounds: Wheezing present.  Abdominal:     General: There is no distension.  Musculoskeletal:        General: Normal range of motion.     Cervical back: Normal range of motion.  Neurological:     Mental Status: She is alert and oriented to person, place, and time.  Psychiatric:        Mood and Affect: Mood normal.     UC Treatments / Results  Labs (all labs ordered are listed, but only abnormal results are displayed) Labs Reviewed - No data to display  EKG   Radiology No results found.  Procedures Procedures (including critical care time)  Medications Ordered in UC Medications - No data to display  Initial Impression / Assessment and Plan / UC Course  I have reviewed the triage vital signs and the nursing notes.  Pertinent labs & imaging results that were  available during my care of the patient were reviewed by me and considered in my medical decision making (see chart for details).     MDM:  history of asthma  Final Clinical Impressions(s) / UC Diagnoses   Final diagnoses:  Viral upper respiratory tract infection     Discharge Instructions      Return if any problems.     ED Prescriptions     Medication Sig Dispense Auth. Provider   predniSONE (DELTASONE) 50 MG tablet Take 1 tablet (50 mg total) by mouth daily. 5 tablet Kylan Liberati K, PA-C   albuterol (VENTOLIN HFA) 108 (90 Base) MCG/ACT inhaler Inhale 2 puffs into the lungs every 6 (six) hours as needed for wheezing or shortness of breath. 8.5 g Fransico Meadow, [place]      PDMP not reviewed this encounter. An After Visit Summary was printed and given to the patient.    Fransico Meadow, [place] 08/04/21 1014

## 2021-08-09 ENCOUNTER — Other Ambulatory Visit (HOSPITAL_COMMUNITY): Payer: Self-pay

## 2021-08-14 ENCOUNTER — Other Ambulatory Visit (HOSPITAL_COMMUNITY): Payer: Self-pay

## 2021-10-08 ENCOUNTER — Other Ambulatory Visit: Payer: Self-pay

## 2021-10-08 ENCOUNTER — Ambulatory Visit (INDEPENDENT_AMBULATORY_CARE_PROVIDER_SITE_OTHER): Payer: 59

## 2021-10-08 ENCOUNTER — Ambulatory Visit (HOSPITAL_COMMUNITY)
Admission: EM | Admit: 2021-10-08 | Discharge: 2021-10-08 | Disposition: A | Discharge status: Home / Self Care | Payer: 59 | Attending: Emergency Medicine | Admitting: Emergency Medicine

## 2021-10-08 ENCOUNTER — Encounter (HOSPITAL_COMMUNITY): Payer: Self-pay

## 2021-10-08 DIAGNOSIS — M25562 Pain in left knee: Secondary | ICD-10-CM

## 2021-10-08 DIAGNOSIS — M1712 Unilateral primary osteoarthritis, left knee: Secondary | ICD-10-CM | POA: Diagnosis not present

## 2021-10-08 DIAGNOSIS — R112 Nausea with vomiting, unspecified: Secondary | ICD-10-CM | POA: Diagnosis not present

## 2021-10-08 LAB — POC URINE PREG, ED: Preg Test, Ur: NEGATIVE

## 2021-10-08 LAB — POCT URINALYSIS DIPSTICK, ED / UC
Bilirubin Urine: NEGATIVE
Glucose, UA: NEGATIVE mg/dL
Hgb urine dipstick: NEGATIVE
Ketones, ur: NEGATIVE mg/dL
Leukocytes,Ua: NEGATIVE
Nitrite: NEGATIVE
Protein, ur: NEGATIVE mg/dL
Specific Gravity, Urine: 1.02 (ref 1.005–1.030)
Urobilinogen, UA: 1 mg/dL (ref 0.0–1.0)
pH: 6 (ref 5.0–8.0)

## 2021-10-08 MED ORDER — ONDANSETRON HCL 4 MG PO TABS: 4.0000 mg | ORAL_TABLET | Freq: Four times a day (QID) | ORAL | 0 refills | Status: DC

## 2021-10-08 MED ORDER — ONDANSETRON HCL 4 MG PO TABS
4.0000 mg | ORAL_TABLET | Freq: Four times a day (QID) | ORAL | 0 refills | Status: DC
  Filled 2021-10-08: qty 12, 3d supply, fill #0

## 2021-10-08 NOTE — ED Provider Notes (Signed)
[facility]    CSN: [id number] Arrival date & time: 10/08/21  1448      History   Chief Complaint Chief Complaint  Patient presents with   Nausea   Knee Pain    HPI Lorraine Chambers is a 35 y.o. female.   HPI Lorraine Chambers is a 36 y.o. female presenting to UC with c/o Left knee pain that started 1 week ago after working out at [employer].  She does not recall a specific injury but reports knee pain and limiping shortly after a workout. Her knee has "given way" several times since.  Pain is aching and sore, 5/10, worse on the anterior part. Hx of arthritis in the same knee. She has tried rest and heat with mild relief.  She also reports nausea and dry heaving since yesterday morning after drinking alcohol on an empty stomach late Friday/early Saturday morning.  She has not tried to eat or drink today. No medication taken for nausea. Denies fever, chills, abdominal pain, back pain, vaginal or urinary symptoms. No known sick contacts or recent travel. LMP: 1 week ago. Pt also reports having an IUD and has a regular cycle.   Past Medical History:  Diagnosis Date   Arthritis    left knee   Diabetes mellitus without complication ([place])    DM (diabetes mellitus), type 2 ([place]) 08/23/2014   last A1C 5.5 % per patient. 05/2015   GERD (gastroesophageal reflux disease)    Headache    Heart murmur    ECHO last friday10/7/16 see cardiology next month Nov 2016   Hydradenitis 02/09/2014   Hypertension    Pt reports episode of HTN in 2009--no longer an issue 05/2015   Hypothyroidism 08/23/2014   resolved since gastric sleeve surgery    Iron deficiency anemia, unspecified 02/09/2014   Obesity    Pneumonia    Thyroid disease     Patient Active Problem List   Diagnosis Date Noted   Obesity 03/09/2016   Gastric outlet obstruction 05/21/2015   Fatty liver disease, nonalcoholic 49/70/2637   S/P laparoscopic sleeve gastrectomy 03/29/2015   Hypothyroidism 08/23/2014   DM  (diabetes mellitus), type 2 ([place]) 08/23/2014   Hydradenitis 02/09/2014   Iron deficiency anemia 02/09/2014   Reactive thrombocytosis 02/09/2014   Immune to hepatitis B 09/24/2013   Positive hepatitis C antibody test 09/24/2013   Hepatitis C antibody test positive 09/24/2013   HERPES SIMPLEX INFECTION, TYPE I 07/07/2008    Past Surgical History:  Procedure Laterality Date   ESOPHAGOGASTRODUODENOSCOPY (EGD) WITH PROPOFOL N/A 05/23/2015   Procedure: ESOPHAGOGASTRODUODENOSCOPY (EGD) WITH PROPOFOL;  Surgeon: Alphonsa Overall, MD;  Location: [facility];  Service: General;  Laterality: N/A;   LAPAROSCOPIC GASTRIC SLEEVE RESECTION N/A 03/29/2015   Procedure: LAPAROSCOPIC GASTRIC SLEEVE RESECTION WITH UPPER ENDO;  Surgeon: Greer Pickerel, MD;  Location: [facility];  Service: General;  Laterality: N/A;   UPPER GI ENDOSCOPY  03/29/2015   Procedure: UPPER GI ENDOSCOPY;  Surgeon: Greer Pickerel, MD;  Location: [facility];  Service: General;;   WISDOM TOOTH EXTRACTION      OB History   No obstetric history on file.      Home Medications    Prior to Admission medications   Medication Sig Start Date End Date Taking? Authorizing Provider  acetaminophen (TYLENOL) 500 MG tablet Take 1,000 mg by mouth as needed (pain).    [provider]  albuterol (VENTOLIN HFA) 108 (90 Base) MCG/ACT inhaler Inhale 2 puffs into the lungs every  6 (six) hours as needed for wheezing or shortness of breath. 08/04/21   Fransico Meadow, PA-C  dicyclomine (BENTYL) 20 MG tablet Take 1 tablet (20 mg total) by mouth 2 (two) times daily. 06/27/20   Khatri, Hina, PA-C  famotidine (PEPCID) 20 MG tablet Take 1 tablet (20 mg total) by mouth 2 (two) times daily for 5 days. Patient not taking: No sig reported 05/17/19 06/27/20  Joy, Raquel Sarna C, PA-C  fluconazole (DIFLUCAN) 150 MG tablet Take 1 tablet (150 mg total) by mouth. Repeat in 3 days if symptoms persist 05/01/21   Chase Picket, MD  ipratropium (ATROVENT) 0.06 % nasal spray Place 2  sprays into both nostrils 3 (three) times daily for 4 days 07/14/21     ipratropium-albuterol (DUONEB) 0.5-2.5 (3) MG/3ML SOLN Take 3 mLs by nebulization every 6 (six) hours as needed. 11/28/20   Pearson Forster, NP  levonorgestrel (MIRENA) 20 MCG/24HR IUD 1 each by Intrauterine route once.    [provider]  metroNIDAZOLE (FLAGYL) 500 MG tablet Take 1 tablet (500 mg total) by mouth 2 (two) times daily. 05/01/21   LampteyMyrene Galas, MD  mupirocin ointment (BACTROBAN) 2 % Apply 1 application topically 3 (three) times daily. Patient not taking: Reported on 06/27/2020 12/13/19   Melynda Ripple, MD  ondansetron (ZOFRAN ODT) 4 MG disintegrating tablet Take 1 tablet (4 mg total) by mouth every 8 (eight) hours as needed for nausea or vomiting. 06/27/20   Khatri, Hina, PA-C  ondansetron (ZOFRAN) 4 MG tablet Take 1 tablet (4 mg total) by mouth every 6 (six) hours. 10/08/21   Noe Gens, PA-C  oseltamivir (TAMIFLU) 75 MG capsule Take 1 capsule (75 mg total) by mouth 2 (two) times daily. 07/14/21     pantoprazole (PROTONIX) 20 MG tablet Take 1 tablet (20 mg total) by mouth daily. Patient not taking: Reported on 06/27/2020 05/17/19 06/27/20  Lorayne Bender, PA-C  predniSONE (DELTASONE) 50 MG tablet Take 1 tablet (50 mg total) by mouth daily. 08/04/21   Fransico Meadow, PA-C  sucralfate (CARAFATE) 1 GM/10ML suspension Take 10 mLs (1 g total) by mouth 4 (four) times daily -  with meals and at bedtime. Patient not taking: Reported on 06/27/2020 05/17/19   Lorayne Bender, PA-C  promethazine (PHENERGAN) 25 MG tablet Take 1 tablet (25 mg total) by mouth every 6 (six) hours as needed for nausea or vomiting. Patient not taking: Reported on 05/17/2019 01/02/19 12/13/19  Antonietta Breach, PA-C    Family History Family History  Problem Relation Age of Onset   Heart disease Mother    Diabetes Mother    Hypertension Mother    Pulmonary fibrosis Mother    Diabetes Father    Arthritis Father    Other Father    Heart  murmur Sister    Stroke Other    Heart failure Other    Cancer Maternal Uncle        brain ca   Pulmonary fibrosis Maternal Grandmother    Heart murmur Other     Social History Social History   Tobacco Use   Smoking status: Never   Smokeless tobacco: Never  Vaping Use   Vaping Use: Never used  Substance Use Topics   Alcohol use: Yes    Comment: occassional, not since august    Drug use: No     Allergies   Bee venom   Review of Systems Review of Systems  Constitutional:  Negative for chills and fever.  HENT:  Negative for congestion, ear pain and sore throat.   Gastrointestinal:  Positive for nausea. Negative for abdominal pain, diarrhea and vomiting.  Musculoskeletal:  Positive for arthralgias and joint swelling. Negative for back pain.  Skin:  Negative for color change and rash.    Physical Exam Triage Vital Signs ED Triage Vitals  Enc Vitals Group     BP 10/08/21 1511 (!) 154/94     Pulse Rate 10/08/21 1511 97     Resp 10/08/21 1511 20     Temp 10/08/21 1511 98 F (36.7 C)     Temp Source 10/08/21 1511 Oral     SpO2 10/08/21 1511 100 %     Weight --      Height --      Head Circumference --      Peak Flow --      Pain Score 10/08/21 1510 5     Pain Loc --      Pain Edu? --      Excl. in [place]? --    No data found.  Updated Vital Signs BP (!) 154/94 (BP Location: Left Arm)    Pulse 97    Temp 98 F (36.7 C) (Oral)    Resp 20    SpO2 100%   Visual Acuity Right Eye Distance:   Left Eye Distance:   Bilateral Distance:    Right Eye Near:   Left Eye Near:    Bilateral Near:     Physical Exam Vitals and nursing note reviewed.  Constitutional:      Appearance: Normal appearance. She is well-developed. She is obese.  HENT:     Head: Normocephalic and atraumatic.     Right Ear: Tympanic membrane and ear canal normal.     Left Ear: Tympanic membrane and ear canal normal.     Nose: Nose normal.     Mouth/Throat:     Mouth: Mucous membranes are  moist.     Pharynx: Oropharynx is clear.  Cardiovascular:     Rate and Rhythm: Normal rate and regular rhythm.  Pulmonary:     Effort: Pulmonary effort is normal. No respiratory distress.     Breath sounds: Normal breath sounds.  Abdominal:     General: There is no distension.     Palpations: Abdomen is soft.     Tenderness: There is no abdominal tenderness. There is no right CVA tenderness or left CVA tenderness.  Musculoskeletal:        General: Tenderness present. Normal range of motion.     Cervical back: Normal range of motion and neck supple. No rigidity or tenderness.     Comments: Left knee: full ROM, tenderness to medial joint space and anterior aspect. No deformity or crepitus noted.  Calf is soft, non-tender.  Lymphadenopathy:     Cervical: No cervical adenopathy.  Skin:    General: Skin is warm and dry.  Neurological:     Mental Status: She is alert and oriented to person, place, and time.  Psychiatric:        Behavior: Behavior normal.     UC Treatments / Results  Labs (all labs ordered are listed, but only abnormal results are displayed) Labs Reviewed  POC URINE PREG, ED  POCT URINALYSIS DIPSTICK, ED / UC    EKG   Radiology DG Knee Complete 4 Views Left  Result Date: 10/08/2021 CLINICAL DATA:  Left knee pain for 1 week. EXAM: LEFT KNEE - COMPLETE 4+ VIEW COMPARISON:  03/02/2013. FINDINGS: No fracture or bone lesion. Mild narrowing of the medial joint space compartment. There are marginal osteophytes from all 3 compartments, most prominent from the medial compartment. No joint effusion. Surrounding soft tissues are unremarkable. IMPRESSION: 1. No fracture or acute finding. 2. Osteoarthritis predominantly involving the medial compartment, which has mildly progressed since the prior study. Electronically Signed   By: Lajean Manes M.D.   On: 10/08/2021 15:54    Procedures Procedures (including critical care time)  Medications Ordered in UC Medications - No  data to display  Initial Impression / Assessment and Plan / UC Course  I have reviewed the triage vital signs and the nursing notes.  Pertinent labs & imaging results that were available during my care of the patient were reviewed by me and considered in my medical decision making (see chart for details).     Left knee: reviewed imaging with pt  Encouraged [facility] home treatment and f/u with orthopedist/sports medicine for further evaluation and treatment.   Nausea: possibly lingering from recent alcohol consumption on empty stomach vs early viral gastroenteritis.  Rx: Zofran Encouraged bland diet to gradually increase to regular diet. F/u with PCP in 2-3 days if not improving, sooner if symptoms worsening.   Final Clinical Impressions(s) / UC Diagnoses   Final diagnoses:  Osteoarthritis of left knee, unspecified osteoarthritis type  Acute pain of left knee  Nausea and vomiting in adult     Discharge Instructions       Please call [facility] tomorrow for further evaluation and treatment of your ongoing knee pain for further evaluation and treatment.  Try to lower your intensity at the gym this week to allow your knee and body time to rest.  Let the orthopedist know you are working out so they can help guide you to the right leg/knee exercises for you due to your osteoarthritis.  Nausea: you may take the prescribed zofran to help with nausea.  Try to stick with a bland diet for 2-3 days until you start to feel better, gradually increasing to your normal diet.  Please see information in this packet about food ideas for a bland diet.      ED Prescriptions     Medication Sig Dispense Auth. Provider   ondansetron (ZOFRAN) 4 MG tablet  (Status: Discontinued) Take 1 tablet (4 mg total) by mouth every 6 (six) hours. 12 tablet Gerarda Fraction, Kemoni O, PA-C   ondansetron (ZOFRAN) 4 MG tablet Take 1 tablet (4 mg total) by mouth every 6 (six) hours. 12 tablet Noe Gens, [place]      PDMP not reviewed this encounter.   Alfonso, [place], [place] 10/08/21 [phone]

## 2021-10-08 NOTE — ED Triage Notes (Signed)
Pt c/o left knee pain for a week. ?Pt reports having n/v since Friday.  ?

## 2021-10-08 NOTE — Discharge Instructions (Signed)
?  Please call [facility] tomorrow for further evaluation and treatment of your ongoing knee pain for further evaluation and treatment.  ?Try to lower your intensity at the gym this week to allow your knee and body time to rest.  Let the orthopedist know you are working out so they can help guide you to the right leg/knee exercises for you due to your osteoarthritis. ? ?Nausea: you may take the prescribed zofran to help with nausea.  Try to stick with a bland diet for 2-3 days until you start to feel better, gradually increasing to your normal diet.  ?Please see information in this packet about food ideas for a bland diet.  ?

## 2021-10-09 ENCOUNTER — Other Ambulatory Visit (HOSPITAL_COMMUNITY): Payer: Self-pay

## 2021-11-30 ENCOUNTER — Encounter (HOSPITAL_COMMUNITY): Payer: Self-pay

## 2021-11-30 ENCOUNTER — Encounter (HOSPITAL_COMMUNITY): Payer: Self-pay | Admitting: Emergency Medicine

## 2021-11-30 ENCOUNTER — Ambulatory Visit (HOSPITAL_COMMUNITY)
Admission: EM | Admit: 2021-11-30 | Discharge: 2021-11-30 | Disposition: A | Discharge status: Home / Self Care | Payer: 59 | Attending: Nurse Practitioner | Admitting: Nurse Practitioner

## 2021-11-30 ENCOUNTER — Other Ambulatory Visit (HOSPITAL_COMMUNITY): Payer: Self-pay

## 2021-11-30 DIAGNOSIS — R3915 Urgency of urination: Secondary | ICD-10-CM

## 2021-11-30 DIAGNOSIS — N898 Other specified noninflammatory disorders of vagina: Secondary | ICD-10-CM

## 2021-11-30 LAB — POC URINE PREG, ED: Preg Test, Ur: NEGATIVE

## 2021-11-30 LAB — POCT URINALYSIS DIPSTICK, ED / UC
Glucose, UA: NEGATIVE mg/dL
Leukocytes,Ua: NEGATIVE
Nitrite: NEGATIVE
Protein, ur: NEGATIVE mg/dL
Specific Gravity, Urine: >=1.03 (ref 1.005–1.030)
Urobilinogen, UA: 1 mg/dL (ref 0.0–1.0)
pH: 5 (ref 5.0–8.0)

## 2021-11-30 LAB — HIV ANTIBODY (ROUTINE TESTING W REFLEX): HIV Screen 4th Generation wRfx: NONREACTIVE

## 2021-11-30 MED ORDER — ALBUTEROL SULFATE HFA 108 (90 BASE) MCG/ACT IN AERS
2.0000 | INHALATION_SPRAY | Freq: Four times a day (QID) | RESPIRATORY_TRACT | 0 refills | Status: DC | PRN
  Filled 2021-11-30: qty 18, 25d supply, fill #0

## 2021-11-30 MED ORDER — NITROFURANTOIN MONOHYD MACRO 100 MG PO CAPS
100.0000 mg | ORAL_CAPSULE | Freq: Two times a day (BID) | ORAL | 0 refills | Status: AC
  Filled 2021-11-30: qty 10, 5d supply, fill #0

## 2021-11-30 NOTE — ED Provider Notes (Signed)
?[facility] ? ? ? ?CSN: [id number] ?Arrival date & time: 11/30/21  0809 ? ? ?  ? ?History   ?Chief Complaint ?Chief Complaint  ?Patient presents with  ? Vaginal Discharge  ? ? ?HPI ?Lorraine Chambers is a 36 y.o. female.  ? ?Patient presents with 1 day of vaginal irritation and vaginal discharge.  He had an IUD placed last June and has had some breakthrough bleeding ever since.  However, the vaginal discharge and irritation is new.  She is sexually active with 1 partner.  She denies fevers, nausea/vomiting, rashes, sores, lesions on her genitalia.  Denies swollen lymph nodes in her groin.  She is also having some increased urinary frequency, urgency, and voiding smaller amounts.  She denies any burning with urination, change in the odor of her urine.  She denies abdominal pain, back pain, flank pain.  She has not taken anything for her symptoms. ? ?She is requesting STI testing today. ? ?Patient is also requesting a refill for the albuterol inhaler.  She reports with exercise, she does experience wheezing.  Has run out of her albuterol inhaler.  She also reports a history of seasonal allergies-has not taken anything at this time. ? ? ? ? ?Past Medical History:  ?Diagnosis Date  ? Arthritis   ? left knee  ? Diabetes mellitus without complication ([place])   ? DM (diabetes mellitus), type 2 ([place]) 08/23/2014  ? last A1C 5.5 % per patient. 05/2015  ? GERD (gastroesophageal reflux disease)   ? Headache   ? Heart murmur   ? ECHO last friday10/7/16 see cardiology next month Nov 2016  ? Hydradenitis 02/09/2014  ? Hypertension   ? Pt reports episode of HTN in 2009--no longer an issue 05/2015  ? Hypothyroidism 08/23/2014  ? resolved since gastric sleeve surgery   ? Iron deficiency anemia, unspecified 02/09/2014  ? Obesity   ? Pneumonia   ? Thyroid disease   ? ? ?Patient Active Problem List  ? Diagnosis Date Noted  ? Obesity 03/09/2016  ? Gastric outlet obstruction 05/21/2015  ? Fatty liver disease, nonalcoholic  51/70/0174  ? S/P laparoscopic sleeve gastrectomy 03/29/2015  ? Hypothyroidism 08/23/2014  ? DM (diabetes mellitus), type 2 ([place]) 08/23/2014  ? Hydradenitis 02/09/2014  ? Iron deficiency anemia 02/09/2014  ? Reactive thrombocytosis 02/09/2014  ? Immune to hepatitis B 09/24/2013  ? Positive hepatitis C antibody test 09/24/2013  ? Hepatitis C antibody test positive 09/24/2013  ? HERPES SIMPLEX INFECTION, TYPE I 07/07/2008  ? ? ?Past Surgical History:  ?Procedure Laterality Date  ? ESOPHAGOGASTRODUODENOSCOPY (EGD) WITH PROPOFOL N/A 05/23/2015  ? Procedure: ESOPHAGOGASTRODUODENOSCOPY (EGD) WITH PROPOFOL;  Surgeon: Alphonsa Overall, MD;  Location: [facility];  Service: General;  Laterality: N/A;  ? LAPAROSCOPIC GASTRIC SLEEVE RESECTION N/A 03/29/2015  ? Procedure: LAPAROSCOPIC GASTRIC SLEEVE RESECTION WITH UPPER ENDO;  Surgeon: Greer Pickerel, MD;  Location: [facility];  Service: General;  Laterality: N/A;  ? UPPER GI ENDOSCOPY  03/29/2015  ? Procedure: UPPER GI ENDOSCOPY;  Surgeon: Greer Pickerel, MD;  Location: [facility];  Service: General;;  ? WISDOM TOOTH EXTRACTION    ? ? ?OB History   ?No obstetric history on file. ?  ? ? ? ?Home Medications   ? ?Prior to Admission medications   ?Medication Sig Start Date End Date Taking? Authorizing Provider  ?nitrofurantoin, macrocrystal-monohydrate, (MACROBID) 100 MG capsule Take 1 capsule (100 mg total) by mouth 2 (two) times daily for 5 days. 11/30/21 12/05/21 Yes Eulogio Bear, NP  ?  acetaminophen (TYLENOL) 500 MG tablet Take 1,000 mg by mouth as needed (pain).    [provider]  ?albuterol (VENTOLIN HFA) 108 (90 Base) MCG/ACT inhaler Inhale 2 puffs into the lungs every 6 (six) hours as needed for wheezing or shortness of breath. 11/30/21   Eulogio Bear, NP  ?dicyclomine (BENTYL) 20 MG tablet Take 1 tablet (20 mg total) by mouth 2 (two) times daily. 06/27/20   Khatri, Hina, PA-C  ?famotidine (PEPCID) 20 MG tablet Take 1 tablet (20 mg total) by mouth 2 (two) times daily for 5  days. ?Patient not taking: No sig reported 05/17/19 06/27/20  Arlean Hopping C, PA-C  ?ipratropium (ATROVENT) 0.06 % nasal spray Place 2 sprays into both nostrils 3 (three) times daily for 4 days 07/14/21     ?ipratropium-albuterol (DUONEB) 0.5-2.5 (3) MG/3ML SOLN Take 3 mLs by nebulization every 6 (six) hours as needed. 11/28/20   Pearson Forster, NP  ?levonorgestrel (MIRENA) 20 MCG/24HR IUD 1 each by Intrauterine route once.    [provider]  ?mupirocin ointment (BACTROBAN) 2 % Apply 1 application topically 3 (three) times daily. ?Patient not taking: Reported on 06/27/2020 12/13/19   Melynda Ripple, MD  ?ondansetron (ZOFRAN) 4 MG tablet Take 1 tablet (4 mg total) by mouth every 6 (six) hours. 10/08/21   Noe Gens, PA-C  ?pantoprazole (PROTONIX) 20 MG tablet Take 1 tablet (20 mg total) by mouth daily. ?Patient not taking: Reported on 06/27/2020 05/17/19 06/27/20  Lorayne Bender, PA-C  ?sucralfate (CARAFATE) 1 GM/10ML suspension Take 10 mLs (1 g total) by mouth 4 (four) times daily -  with meals and at bedtime. ?Patient not taking: Reported on 06/27/2020 05/17/19   Lorayne Bender, PA-C  ?promethazine (PHENERGAN) 25 MG tablet Take 1 tablet (25 mg total) by mouth every 6 (six) hours as needed for nausea or vomiting. ?Patient not taking: Reported on 05/17/2019 01/02/19 12/13/19  Antonietta Breach, PA-C  ? ? ?Family History ?Family History  ?Problem Relation Age of Onset  ? Heart disease Mother   ? Diabetes Mother   ? Hypertension Mother   ? Pulmonary fibrosis Mother   ? Diabetes Father   ? Arthritis Father   ? Other Father   ? Heart murmur Sister   ? Stroke Other   ? Heart failure Other   ? Cancer Maternal Uncle   ?     brain ca  ? Pulmonary fibrosis Maternal Grandmother   ? Heart murmur Other   ? ? ?Social History ?Social History  ? ?Tobacco Use  ? Smoking status: Never  ? Smokeless tobacco: Never  ?Vaping Use  ? Vaping Use: Never used  ?Substance Use Topics  ? Alcohol use: Yes  ?  Comment: occassional, not since august    ? Drug use: No  ? ? ? ?Allergies   ?Bee venom ? ? ?Review of Systems ?Review of Systems ?Per HPI ? ?Physical Exam ?Triage Vital Signs ?ED Triage Vitals  ?Enc Vitals Group  ?   BP 11/30/21 0843 134/81  ?   Pulse Rate 11/30/21 0843 78  ?   Resp 11/30/21 0843 17  ?   Temp 11/30/21 0843 98.1 ?F (36.7 ?C)  ?   Temp Source 11/30/21 0843 Oral  ?   SpO2 11/30/21 0843 100 %  ?   Weight --   ?   Height --   ?   Head Circumference --   ?   Peak Flow --   ?  Pain Score 11/30/21 0840 0  ?   Pain Loc --   ?   Pain Edu? --   ?   Excl. in [place]? --   ? ?No data found. ? ?Updated Vital Signs ?BP 134/81 (BP Location: Right Arm)   Pulse 78   Temp 98.1 ?F (36.7 ?C) (Oral)   Resp 17   LMP 11/16/2021   SpO2 100%  ? ?Visual Acuity ?Right Eye Distance:   ?Left Eye Distance:   ?Bilateral Distance:   ? ?Right Eye Near:   ?Left Eye Near:    ?Bilateral Near:    ? ?Physical Exam ?Vitals and nursing note reviewed.  ?Constitutional:   ?   General: She is not in acute distress. ?   Appearance: She is obese. She is not ill-appearing, toxic-appearing or diaphoretic.  ?Cardiovascular:  ?   Rate and Rhythm: Normal rate and regular rhythm.  ?Pulmonary:  ?   Effort: Pulmonary effort is normal. No respiratory distress.  ?   Breath sounds: Normal breath sounds. No wheezing, rhonchi or rales.  ?Abdominal:  ?   General: Abdomen is flat. Bowel sounds are normal. There is no distension.  ?   Palpations: Abdomen is soft. There is no mass.  ?   Tenderness: There is no abdominal tenderness. There is no right CVA tenderness, left CVA tenderness or guarding.  ?Genitourinary: ?   Comments: Deferred ?Skin: ?   General: Skin is warm and dry.  ?   Coloration: Skin is not jaundiced or pale.  ?   Findings: No erythema.  ?Neurological:  ?   Mental Status: She is alert and oriented to person, place, and time.  ?   Motor: No weakness.  ?   Gait: Gait normal.  ?Psychiatric:     ?   Behavior: Behavior is cooperative.  ? ? ?UC Treatments / Results  ?Labs ?(all labs  ordered are listed, but only abnormal results are displayed) ?Labs Reviewed  ?POCT URINALYSIS DIPSTICK, ED / UC - Abnormal; Notable for the following components:  ?    Result Value  ? Bilirubin Urine SMALL (*)   ? [place]

## 2021-11-30 NOTE — Discharge Instructions (Addendum)
-  Slight possible dehydration, little bit of blood in your urine.  Please start on the Macrobid 100 mg twice daily for 5 days for possible urinary tract infection ?-We are sending the urine for culture and will let you know if we need to change antibiotics ?-We will also let you know if anything comes back positive from the vaginal testing; please use condoms with every sexual contact prevent spread of STI ?-I sent a refill of your albuterol to the pharmacy and we will reach out to you to help schedule an appointment with a primary care provider ?-Please start daily oral antihistamine like Zyrtec and intranasal corticosteroid like Flonase ?

## 2021-11-30 NOTE — ED Triage Notes (Signed)
Pt reports since last year when got IUD will have spotting in between her cycles with discharge and irritation.  ?Pt also wanting a refill on her inhaler.  ?

## 2021-12-01 LAB — URINE CULTURE: Culture: <10000 — AB

## 2021-12-01 LAB — RPR: RPR Ser Ql: NONREACTIVE

## 2021-12-04 LAB — CERVICOVAGINAL ANCILLARY ONLY
Bacterial Vaginitis (gardnerella): POSITIVE — AB
Candida Glabrata: NEGATIVE
Candida Vaginitis: NEGATIVE
Chlamydia: NEGATIVE
Comment: NEGATIVE
Comment: NEGATIVE
Comment: NEGATIVE
Comment: NEGATIVE
Comment: NEGATIVE
Comment: NORMAL
Neisseria Gonorrhea: NEGATIVE
Trichomonas: NEGATIVE

## 2021-12-05 ENCOUNTER — Other Ambulatory Visit (HOSPITAL_COMMUNITY): Payer: Self-pay

## 2021-12-05 ENCOUNTER — Telehealth (HOSPITAL_COMMUNITY): Payer: Self-pay | Admitting: Emergency Medicine

## 2021-12-05 MED ORDER — FLUCONAZOLE 150 MG PO TABS
150.0000 mg | ORAL_TABLET | Freq: Once | ORAL | 0 refills | Status: AC
  Filled 2021-12-05: qty 2, 3d supply, fill #0

## 2021-12-05 MED ORDER — METRONIDAZOLE 500 MG PO TABS
500.0000 mg | ORAL_TABLET | Freq: Two times a day (BID) | ORAL | 0 refills | Status: DC
  Filled 2021-12-05: qty 14, 7d supply, fill #0

## 2022-01-04 ENCOUNTER — Emergency Department (HOSPITAL_BASED_OUTPATIENT_CLINIC_OR_DEPARTMENT_OTHER)
Admission: EM | Admit: 2022-01-04 | Discharge: 2022-01-04 | Disposition: A | Discharge status: Home / Self Care | Payer: 59 | Attending: Emergency Medicine | Admitting: Emergency Medicine

## 2022-01-04 ENCOUNTER — Other Ambulatory Visit: Payer: Self-pay

## 2022-01-04 ENCOUNTER — Encounter (HOSPITAL_BASED_OUTPATIENT_CLINIC_OR_DEPARTMENT_OTHER): Payer: Self-pay | Admitting: *Deleted

## 2022-01-04 DIAGNOSIS — B9789 Other viral agents as the cause of diseases classified elsewhere: Secondary | ICD-10-CM | POA: Diagnosis not present

## 2022-01-04 DIAGNOSIS — J029 Acute pharyngitis, unspecified: Secondary | ICD-10-CM | POA: Insufficient documentation

## 2022-01-04 LAB — GROUP A STREP BY PCR: Group A Strep by PCR: NOT DETECTED

## 2022-01-04 LAB — CBG MONITORING, ED: Glucose-Capillary: 91 mg/dL (ref 70–99)

## 2022-01-04 MED ORDER — DEXAMETHASONE SODIUM PHOSPHATE 10 MG/ML IJ SOLN
10.0000 mg | Freq: Once | INTRAMUSCULAR | Status: AC
  Administered 2022-01-04: 10 mg via INTRAMUSCULAR
  Filled 2022-01-04: qty 1

## 2022-01-04 MED ORDER — LIDOCAINE VISCOUS HCL 2 % MT SOLN: 15.0000 mL | OROMUCOSAL | 0 refills | Status: DC | PRN

## 2022-01-04 MED ORDER — LIDOCAINE VISCOUS HCL 2 % MT SOLN
15.0000 mL | OROMUCOSAL | 0 refills | Status: DC | PRN
  Filled 2022-01-04: qty 100, 7d supply, fill #0
  Filled 2022-01-05: qty 100, 5d supply, fill #0

## 2022-01-04 NOTE — Discharge Instructions (Addendum)
Swish and spit lidocaine to help with throat discomfort. Take Tylenol as needed for pain. Steroid shot will help with pain and swelling. We will call you if your COVID test is positive. Return to the ER if you start to experience worsening symptoms, trouble breathing, trouble swallowing, trouble opening your mouth or chest pain

## 2022-01-04 NOTE — ED Notes (Signed)
Discharge paperwork given and understood. 

## 2022-01-04 NOTE — ED Provider Notes (Signed)
[facility] DEPT Provider Note   CSN: [id number] Arrival date & time: 01/04/22  1907     History  Chief Complaint  Patient presents with   Sore Throat    Lorraine Chambers is a 36 y.o. female presenting to the ED with a chief complaint of sore throat.  Symptoms began about 2 to 3 days ago.  Reports that she noticed white patches on her tonsils today.  Denies cough.  Reports chills but no documented fevers.  Denies any neck stiffness, vomiting.   Sore Throat      Home Medications Prior to Admission medications   Medication Sig Start Date End Date Taking? Authorizing Provider  lidocaine (XYLOCAINE) 2 % solution Use as directed 15 mLs in the mouth or throat as needed for mouth pain. 01/04/22  Yes Desma Wilkowski, PA-C  acetaminophen (TYLENOL) 500 MG tablet Take 1,000 mg by mouth as needed (pain).    [provider]  albuterol (VENTOLIN HFA) 108 (90 Base) MCG/ACT inhaler Inhale 2 puffs into the lungs every 6 (six) hours as needed for wheezing or shortness of breath. 11/30/21   Eulogio Bear, NP  dicyclomine (BENTYL) 20 MG tablet Take 1 tablet (20 mg total) by mouth 2 (two) times daily. 06/27/20   Lexiana Spindel, PA-C  famotidine (PEPCID) 20 MG tablet Take 1 tablet (20 mg total) by mouth 2 (two) times daily for 5 days. Patient not taking: No sig reported 05/17/19 06/27/20  Joy, Shawn C, PA-C  ipratropium (ATROVENT) 0.06 % nasal spray Place 2 sprays into both nostrils 3 (three) times daily for 4 days 07/14/21     ipratropium-albuterol (DUONEB) 0.5-2.5 (3) MG/3ML SOLN Take 3 mLs by nebulization every 6 (six) hours as needed. 11/28/20   Pearson Forster, NP  levonorgestrel (MIRENA) 20 MCG/24HR IUD 1 each by Intrauterine route once.    [provider]  metroNIDAZOLE (FLAGYL) 500 MG tablet Take 1 tablet (500 mg total) by mouth 2 (two) times daily. 12/05/21   Lamptey, Myrene Galas, MD  mupirocin ointment (BACTROBAN) 2 % Apply 1 application topically 3  (three) times daily. Patient not taking: Reported on 06/27/2020 12/13/19   Melynda Ripple, MD  ondansetron (ZOFRAN) 4 MG tablet Take 1 tablet (4 mg total) by mouth every 6 (six) hours. 10/08/21   Noe Gens, PA-C  pantoprazole (PROTONIX) 20 MG tablet Take 1 tablet (20 mg total) by mouth daily. Patient not taking: Reported on 06/27/2020 05/17/19 06/27/20  Joy, Raquel Sarna C, PA-C  sucralfate (CARAFATE) 1 GM/10ML suspension Take 10 mLs (1 g total) by mouth 4 (four) times daily -  with meals and at bedtime. Patient not taking: Reported on 06/27/2020 05/17/19   Lorayne Bender, PA-C  promethazine (PHENERGAN) 25 MG tablet Take 1 tablet (25 mg total) by mouth every 6 (six) hours as needed for nausea or vomiting. Patient not taking: Reported on 05/17/2019 01/02/19 12/13/19  Antonietta Breach, PA-C      Allergies    Bee venom    Review of Systems   Review of Systems  Constitutional:  Positive for chills. Negative for fever.  HENT:  Positive for sore throat.   Musculoskeletal:  Negative for neck pain and neck stiffness.   Physical Exam Updated Vital Signs BP 124/76 (BP Location: Right Arm)   Pulse 85   Temp 98.9 F (37.2 C) (Oral)   Resp 14   Wt 129.3 kg   SpO2 100%   BMI 55.66 kg/m  Physical Exam Vitals and nursing  note reviewed.  Constitutional:      General: She is not in acute distress.    Appearance: She is well-developed. She is not diaphoretic.  HENT:     Head: Normocephalic and atraumatic.     Mouth/Throat:     Pharynx: Uvula midline. Posterior oropharyngeal erythema present.     Tonsils: Tonsillar exudate present. 1+ on the right. 1+ on the left.     Comments: Bilaterally, symmetrically enlarged tonsils with exudates. Patient does not appear to be in acute distress. No trismus or drooling present. No pooling of secretions. Patient is tolerating secretions and is not in respiratory distress. No neck pain or tenderness to palpation of the neck. Full active and passive range of motion of the  neck. No evidence of RPA or PTA. Eyes:     General: No scleral icterus.    Conjunctiva/sclera: Conjunctivae normal.  Pulmonary:     Effort: Pulmonary effort is normal. No respiratory distress.  Musculoskeletal:     Cervical back: Normal range of motion.  Skin:    Findings: No rash.  Neurological:     Mental Status: She is alert.    ED Results / Procedures / Treatments   Labs (all labs ordered are listed, but only abnormal results are displayed) Labs Reviewed  GROUP A STREP BY PCR  SARS CORONAVIRUS 2 BY RT PCR  CBG MONITORING, ED    EKG None  Radiology No results found.  Procedures Procedures    Medications Ordered in ED Medications  dexamethasone (DECADRON) injection 10 mg (has no administration in time range)    ED Course/ Medical Decision Making/ A&P Clinical Course as of 01/04/22 2111  Thu Jan 04, 2022  2029 Group A Strep by PCR: NOT DETECTED [HK]  2104 Glucose-Capillary: 91 [HK]    Clinical Course User Index [HK] Delia Heady, PA-C                           Medical Decision Making  Pt rapid strep test negative. Pt is tolerating secretions, not in respiratory distress, no neck pain, no trismus. Presentation not concerning for peritonsillar abscess or spread of infection to deep spaces of the throat; patent airway. Pt will be discharged with lidocaine to swish and spit, given a shot of Decadron here to help with discomfort and swelling. Ibuprofen or Tylenol as needed for pain/fever. Specific return precautions discussed. Recommended PCP follow up. Pt appears safe for discharge.    Patient is hemodynamically stable, in NAD, and able to ambulate in the ED. Evaluation does not show pathology that would require ongoing emergent intervention or inpatient treatment. I explained the diagnosis to the patient. Pain has been managed and has no complaints prior to discharge. Patient is comfortable with above plan and is stable for discharge at this time. All questions were  answered prior to disposition. Strict return precautions for returning to the ED were discussed. Encouraged follow up with PCP.   An After Visit Summary was printed and given to the patient.   Portions of this note were generated with [employer]. Dictation errors may occur despite best attempts at proofreading.         Final Clinical Impression(s) / ED Diagnoses Final diagnoses:  Viral pharyngitis    Rx / DC Orders ED Discharge Orders          Ordered    lidocaine (XYLOCAINE) 2 % solution  As needed  01/04/22 2111              Delia Heady, PA-C 01/04/22 2111    Gareth Morgan, MD 01/05/22 [phone]

## 2022-01-04 NOTE — ED Triage Notes (Signed)
Pt is here for sore throat with HA which began 2 days ago.

## 2022-01-05 ENCOUNTER — Other Ambulatory Visit (HOSPITAL_COMMUNITY): Payer: Self-pay

## 2022-01-10 ENCOUNTER — Other Ambulatory Visit (HOSPITAL_COMMUNITY): Payer: Self-pay

## 2022-01-10 DIAGNOSIS — Z8639 Personal history of other endocrine, nutritional and metabolic disease: Secondary | ICD-10-CM | POA: Diagnosis not present

## 2022-01-10 DIAGNOSIS — Z0001 Encounter for general adult medical examination with abnormal findings: Secondary | ICD-10-CM | POA: Diagnosis not present

## 2022-01-10 DIAGNOSIS — M722 Plantar fascial fibromatosis: Secondary | ICD-10-CM | POA: Diagnosis not present

## 2022-01-10 DIAGNOSIS — R7989 Other specified abnormal findings of blood chemistry: Secondary | ICD-10-CM | POA: Diagnosis not present

## 2022-01-10 DIAGNOSIS — Z23 Encounter for immunization: Secondary | ICD-10-CM | POA: Diagnosis not present

## 2022-01-10 DIAGNOSIS — E559 Vitamin D deficiency, unspecified: Secondary | ICD-10-CM | POA: Diagnosis not present

## 2022-01-10 DIAGNOSIS — J4530 Mild persistent asthma, uncomplicated: Secondary | ICD-10-CM | POA: Diagnosis not present

## 2022-01-10 DIAGNOSIS — Z6841 Body Mass Index (BMI) 40.0 and over, adult: Secondary | ICD-10-CM | POA: Diagnosis not present

## 2022-01-10 MED ORDER — BUDESONIDE-FORMOTEROL FUMARATE 80-4.5 MCG/ACT IN AERO
2.0000 | INHALATION_SPRAY | Freq: Two times a day (BID) | RESPIRATORY_TRACT | 3 refills | Status: AC
Start: 2022-01-10 — End: ?
  Filled 2022-01-10: qty 10.2, 30d supply, fill #0

## 2022-01-10 MED ORDER — ALBUTEROL SULFATE HFA 108 (90 BASE) MCG/ACT IN AERS
1.0000 | INHALATION_SPRAY | RESPIRATORY_TRACT | 2 refills | Status: AC | PRN
  Filled 2022-01-10: qty 18, 17d supply, fill #0

## 2022-01-12 ENCOUNTER — Other Ambulatory Visit (HOSPITAL_COMMUNITY): Payer: Self-pay

## 2022-01-12 MED ORDER — VITAMIN D (ERGOCALCIFEROL) 50000 UNITS PO CAPS
1.0000 | ORAL_CAPSULE | ORAL | 0 refills | Status: DC
  Filled 2022-01-12: qty 12, 84d supply, fill #0

## 2022-01-18 ENCOUNTER — Encounter (HOSPITAL_COMMUNITY): Payer: Self-pay | Admitting: Emergency Medicine

## 2022-01-18 ENCOUNTER — Ambulatory Visit (HOSPITAL_COMMUNITY)
Admission: EM | Admit: 2022-01-18 | Discharge: 2022-01-18 | Disposition: A | Discharge status: Home / Self Care | Payer: 59 | Attending: Emergency Medicine | Admitting: Emergency Medicine

## 2022-01-18 ENCOUNTER — Other Ambulatory Visit (HOSPITAL_COMMUNITY): Payer: Self-pay

## 2022-01-18 DIAGNOSIS — N898 Other specified noninflammatory disorders of vagina: Secondary | ICD-10-CM | POA: Diagnosis not present

## 2022-01-18 DIAGNOSIS — R1030 Lower abdominal pain, unspecified: Secondary | ICD-10-CM | POA: Diagnosis not present

## 2022-01-18 LAB — POCT URINALYSIS DIPSTICK, ED / UC
Bilirubin Urine: NEGATIVE
Glucose, UA: NEGATIVE mg/dL
Ketones, ur: NEGATIVE mg/dL
Nitrite: NEGATIVE
Protein, ur: NEGATIVE mg/dL
Specific Gravity, Urine: 1.025 (ref 1.005–1.030)
Urobilinogen, UA: 2 mg/dL — ABNORMAL HIGH (ref 0.0–1.0)
pH: 7 (ref 5.0–8.0)

## 2022-01-18 LAB — POC URINE PREG, ED: Preg Test, Ur: NEGATIVE

## 2022-01-18 MED ORDER — FLUCONAZOLE 150 MG PO TABS
150.0000 mg | ORAL_TABLET | Freq: Every day | ORAL | 0 refills | Status: DC
  Filled 2022-01-18: qty 1, 1d supply, fill #0

## 2022-01-18 MED ORDER — METRONIDAZOLE 500 MG PO TABS
500.0000 mg | ORAL_TABLET | Freq: Two times a day (BID) | ORAL | 0 refills | Status: DC
  Filled 2022-01-18: qty 14, 7d supply, fill #0

## 2022-01-18 NOTE — Discharge Instructions (Addendum)
We are treating you for BV d/t symptoms and previous s/s:  do not drink alcohol while on meds(tests are pending). You will be notified if further treatment indicated.

## 2022-01-18 NOTE — ED Provider Notes (Signed)
[facility]    CSN: [id number] Arrival date & time: 01/18/22  0809      History   Chief Complaint Chief Complaint  Patient presents with   Abdominal Pain   Vaginal Discharge    HPI Lorraine Chambers is a 36 y.o. female.   36 year old female, Lorraine Chambers, presents to urgent care chief complaint of lower abdominal pain vaginal discharge x1 week.  Patient states she at the end of her menses and feels that this is BV as she has had this in the past.  Patient denies any new partners or STI concerns.  Patient denies any fever ,nausea, or vomiting.  Patient states she has a history of diabetes however she had gastric sleeve and A1c is now in the 4% range, no current treatment for diabetes.    The history is provided by the patient. No language interpreter was used.    Past Medical History:  Diagnosis Date   Arthritis    left knee   Diabetes mellitus without complication ([place])    DM (diabetes mellitus), type 2 ([place]) 08/23/2014   last A1C 5.5 % per patient. 05/2015   GERD (gastroesophageal reflux disease)    Headache    Heart murmur    ECHO last friday10/7/16 see cardiology next month Nov 2016   Hydradenitis 02/09/2014   Hypertension    Pt reports episode of HTN in 2009--no longer an issue 05/2015   Hypothyroidism 08/23/2014   resolved since gastric sleeve surgery    Iron deficiency anemia, unspecified 02/09/2014   Obesity    Pneumonia    Thyroid disease     Patient Active Problem List   Diagnosis Date Noted   Vaginal discharge 01/18/2022   Lower abdominal pain 01/18/2022   Obesity 03/09/2016   Gastric outlet obstruction 05/21/2015   Fatty liver disease, nonalcoholic 52/77/8242   S/P laparoscopic sleeve gastrectomy 03/29/2015   Hypothyroidism 08/23/2014   DM (diabetes mellitus), type 2 ([place]) 08/23/2014   Hydradenitis 02/09/2014   Iron deficiency anemia 02/09/2014   Reactive thrombocytosis 02/09/2014   Immune to hepatitis B 09/24/2013   Positive  hepatitis C antibody test 09/24/2013   Hepatitis C antibody test positive 09/24/2013   HERPES SIMPLEX INFECTION, TYPE I 07/07/2008    Past Surgical History:  Procedure Laterality Date   ESOPHAGOGASTRODUODENOSCOPY (EGD) WITH PROPOFOL N/A 05/23/2015   Procedure: ESOPHAGOGASTRODUODENOSCOPY (EGD) WITH PROPOFOL;  Surgeon: Alphonsa Overall, MD;  Location: [facility];  Service: General;  Laterality: N/A;   LAPAROSCOPIC GASTRIC SLEEVE RESECTION N/A 03/29/2015   Procedure: LAPAROSCOPIC GASTRIC SLEEVE RESECTION WITH UPPER ENDO;  Surgeon: Greer Pickerel, MD;  Location: [facility];  Service: General;  Laterality: N/A;   UPPER GI ENDOSCOPY  03/29/2015   Procedure: UPPER GI ENDOSCOPY;  Surgeon: Greer Pickerel, MD;  Location: [facility];  Service: General;;   WISDOM TOOTH EXTRACTION      OB History   No obstetric history on file.      Home Medications    Prior to Admission medications   Medication Sig Start Date End Date Taking? Authorizing Provider  fluconazole (DIFLUCAN) 150 MG tablet Take 1 tablet (150 mg total) by mouth daily. 3/53/61  Yes Lyndle Pang, Jeanett Schlein, NP  metroNIDAZOLE (FLAGYL) 500 MG tablet Take 1 tablet (500 mg total) by mouth 2 (two) times daily. 4/43/15  Yes Rhodie Cienfuegos, Jeanett Schlein, NP  acetaminophen (TYLENOL) 500 MG tablet Take 1,000 mg by mouth as needed (pain).    [provider]  albuterol (VENTOLIN HFA) 108 (90 Base) MCG/ACT inhaler  Inhale 1-2 puffs into the lungs every 4 (four) hours as needed. 01/10/22     budesonide-formoterol (SYMBICORT) 80-4.5 MCG/ACT inhaler Inhale 2 puffs into the lungs 2 (two) times daily. 01/10/22     dicyclomine (BENTYL) 20 MG tablet Take 1 tablet (20 mg total) by mouth 2 (two) times daily. 06/27/20   Khatri, Hina, PA-C  famotidine (PEPCID) 20 MG tablet Take 1 tablet (20 mg total) by mouth 2 (two) times daily for 5 days. Patient not taking: No sig reported 05/17/19 06/27/20  Joy, Shawn C, PA-C  ipratropium (ATROVENT) 0.06 % nasal spray Place 2 sprays into both  nostrils 3 (three) times daily for 4 days 07/14/21     ipratropium-albuterol (DUONEB) 0.5-2.5 (3) MG/3ML SOLN Take 3 mLs by nebulization every 6 (six) hours as needed. 11/28/20   Pearson Forster, NP  levonorgestrel (MIRENA) 20 MCG/24HR IUD 1 each by Intrauterine route once.    [provider]  lidocaine (XYLOCAINE) 2 % solution Use as directed 15 mLs in the mouth or throat as needed for mouth pain. 01/04/22   Khatri, Hina, PA-C  mupirocin ointment (BACTROBAN) 2 % Apply 1 application topically 3 (three) times daily. Patient not taking: Reported on 06/27/2020 12/13/19   Melynda Ripple, MD  ondansetron (ZOFRAN) 4 MG tablet Take 1 tablet (4 mg total) by mouth every 6 (six) hours. 10/08/21   Noe Gens, PA-C  pantoprazole (PROTONIX) 20 MG tablet Take 1 tablet (20 mg total) by mouth daily. Patient not taking: Reported on 06/27/2020 05/17/19 06/27/20  Joy, Raquel Sarna C, PA-C  sucralfate (CARAFATE) 1 GM/10ML suspension Take 10 mLs (1 g total) by mouth 4 (four) times daily -  with meals and at bedtime. Patient not taking: Reported on 06/27/2020 05/17/19   Lorayne Bender, PA-C  Vitamin D, Ergocalciferol, 50000 units CAPS Take 1 capsule by mouth once a week. 01/12/22     promethazine (PHENERGAN) 25 MG tablet Take 1 tablet (25 mg total) by mouth every 6 (six) hours as needed for nausea or vomiting. Patient not taking: Reported on 05/17/2019 01/02/19 12/13/19  Antonietta Breach, PA-C    Family History Family History  Problem Relation Age of Onset   Heart disease Mother    Diabetes Mother    Hypertension Mother    Pulmonary fibrosis Mother    Diabetes Father    Arthritis Father    Other Father    Heart murmur Sister    Stroke Other    Heart failure Other    Cancer Maternal Uncle        brain ca   Pulmonary fibrosis Maternal Grandmother    Heart murmur Other     Social History Social History   Tobacco Use   Smoking status: Never   Smokeless tobacco: Never  Vaping Use   Vaping Use: Never used   Substance Use Topics   Alcohol use: Yes    Comment: occassional, not since august    Drug use: No     Allergies   Bee venom   Review of Systems Review of Systems  Constitutional:  Negative for appetite change, chills and fever.  HENT: Negative.    Respiratory:  Negative for shortness of breath.   Cardiovascular:  Negative for chest pain.  Gastrointestinal:  Positive for abdominal pain. Negative for constipation, diarrhea, nausea and vomiting.  Endocrine: Negative.   Genitourinary:  Positive for vaginal discharge. Negative for dysuria, hematuria and vaginal bleeding.  Musculoskeletal:  Negative for back pain.  Skin:  Negative  for rash.  Allergic/Immunologic: Negative.   Neurological: Negative.   Hematological: Negative.   Psychiatric/Behavioral: Negative.    All other systems reviewed and are negative.    Physical Exam Triage Vital Signs ED Triage Vitals  Enc Vitals Group     BP      Pulse      Resp      Temp      Temp src      SpO2      Weight      Height      Head Circumference      Peak Flow      Pain Score      Pain Loc      Pain Edu?      Excl. in [place]?    No data found.  Updated Vital Signs BP (!) 150/89   Pulse 90   Temp 98.5 F (36.9 C)   Resp 18   SpO2 98%   Visual Acuity Right Eye Distance:   Left Eye Distance:   Bilateral Distance:    Right Eye Near:   Left Eye Near:    Bilateral Near:     Physical Exam Vitals and nursing note reviewed.  Constitutional:      General: She is not in acute distress.    Appearance: She is well-developed.  Eyes:     Pupils: Pupils are equal, round, and reactive to light.  Cardiovascular:     Rate and Rhythm: Normal rate and regular rhythm.     Heart sounds: Normal heart sounds.  Pulmonary:     Effort: Pulmonary effort is normal.     Breath sounds: Normal breath sounds.  Abdominal:     General: Bowel sounds are normal. There is no distension.     Palpations: Abdomen is soft.     Tenderness: There  is abdominal tenderness in the suprapubic area. There is no guarding or rebound.  Musculoskeletal:        General: Normal range of motion.     Cervical back: Normal range of motion.  Skin:    General: Skin is warm and dry.     Findings: No rash.  Neurological:     General: No focal deficit present.     Mental Status: She is alert and oriented to person, place, and time.     GCS: GCS eye subscore is 4. GCS verbal subscore is 5. GCS motor subscore is 6.  Psychiatric:        Attention and Perception: Attention normal.        Mood and Affect: Mood normal.        Speech: Speech normal.        Behavior: Behavior normal.      UC Treatments / Results  Labs (all labs ordered are listed, but only abnormal results are displayed) Labs Reviewed  POCT URINALYSIS DIPSTICK, ED / UC - Abnormal; Notable for the following components:      Result Value   Hgb urine dipstick MODERATE (*)    Urobilinogen, UA 2.0 (*)    Leukocytes,Ua SMALL (*)    All other components within normal limits  POC URINE PREG, ED  CERVICOVAGINAL ANCILLARY ONLY    EKG   Radiology No results found.  Procedures Procedures (including critical care time)  Medications Ordered in UC Medications - No data to display  Initial Impression / Assessment and Plan / UC Course  I have reviewed the triage vital signs and the nursing notes.  Pertinent  labs & imaging results that were available during my care of the patient were reviewed by me and considered in my medical decision making (see chart for details).  Clinical Course as of 01/18/22 1408  Thu Jan 18, 2022  0955 UA is negative for uti, +hgb,small leuk, no nitrates(finishing period per pt report). [JD]  5638 Preg test negative [JD]    Clinical Course User Index [JD] Jakeline Dave, Jeanett Schlein, NP  Advised pt to go to Er for worsening symptoms, unable to keep fluids or meds down. Pt verbalized understanding to this provider  DDx: BV,Vag discahrge, yeast, STI, UTI, abd  pain Final Clinical Impressions(s) / UC Diagnoses   Final diagnoses:  Vaginal discharge  Lower abdominal pain     Discharge Instructions      We are treating you for BV d/t symptoms and previous s/s:  do not drink alcohol while on meds(tests are pending). You will be notified if further treatment indicated.     ED Prescriptions     Medication Sig Dispense Auth. Provider   metroNIDAZOLE (FLAGYL) 500 MG tablet Take 1 tablet (500 mg total) by mouth 2 (two) times daily. 14 tablet Kainon Varady, NP   fluconazole (DIFLUCAN) 150 MG tablet Take 1 tablet (150 mg total) by mouth daily. 1 tablet Zerah Hilyer, Jeanett Schlein, NP      PDMP not reviewed this encounter.   Tori Milks, NP 93/73/42 1408

## 2022-01-18 NOTE — ED Triage Notes (Signed)
Pt is present today with vaginal irritation, vaginal odor, and lower abdominal pain. Pt sx started one week ago

## 2022-01-19 ENCOUNTER — Other Ambulatory Visit (HOSPITAL_COMMUNITY): Payer: Self-pay

## 2022-01-19 ENCOUNTER — Telehealth (HOSPITAL_COMMUNITY): Payer: Self-pay | Admitting: Emergency Medicine

## 2022-01-19 LAB — CERVICOVAGINAL ANCILLARY ONLY
Bacterial Vaginitis (gardnerella): NEGATIVE
Candida Glabrata: NEGATIVE
Candida Vaginitis: NEGATIVE
Chlamydia: NEGATIVE
Comment: NEGATIVE
Comment: NEGATIVE
Comment: NEGATIVE
Comment: NEGATIVE
Comment: NEGATIVE
Comment: NORMAL
Neisseria Gonorrhea: NEGATIVE
Trichomonas: POSITIVE — AB

## 2022-05-16 ENCOUNTER — Other Ambulatory Visit (HOSPITAL_COMMUNITY): Payer: Self-pay

## 2022-05-16 ENCOUNTER — Encounter (HOSPITAL_COMMUNITY): Payer: Self-pay

## 2022-05-16 ENCOUNTER — Ambulatory Visit (HOSPITAL_COMMUNITY)
Admission: EM | Admit: 2022-05-16 | Discharge: 2022-05-16 | Disposition: A | Discharge status: Home / Self Care | Payer: 59 | Attending: Internal Medicine | Admitting: Internal Medicine

## 2022-05-16 DIAGNOSIS — N898 Other specified noninflammatory disorders of vagina: Secondary | ICD-10-CM | POA: Insufficient documentation

## 2022-05-16 LAB — POCT URINALYSIS DIPSTICK, ED / UC
Bilirubin Urine: NEGATIVE
Glucose, UA: NEGATIVE mg/dL
Hgb urine dipstick: NEGATIVE
Ketones, ur: NEGATIVE mg/dL
Leukocytes,Ua: NEGATIVE
Nitrite: NEGATIVE
Protein, ur: NEGATIVE mg/dL
Specific Gravity, Urine: 1.025 (ref 1.005–1.030)
Urobilinogen, UA: 1 mg/dL (ref 0.0–1.0)
pH: 5 (ref 5.0–8.0)

## 2022-05-16 LAB — POC URINE PREG, ED: Preg Test, Ur: NEGATIVE

## 2022-05-16 LAB — RPR: RPR Ser Ql: NONREACTIVE

## 2022-05-16 MED ORDER — FLUCONAZOLE 150 MG PO TABS
150.0000 mg | ORAL_TABLET | Freq: Every day | ORAL | 0 refills | Status: DC
  Filled 2022-05-16: qty 2, 2d supply, fill #0

## 2022-05-16 MED ORDER — METRONIDAZOLE 500 MG PO TABS
500.0000 mg | ORAL_TABLET | Freq: Two times a day (BID) | ORAL | 0 refills | Status: DC
  Filled 2022-05-16: qty 14, 7d supply, fill #0

## 2022-05-16 NOTE — ED Provider Notes (Signed)
[facility]   [id number] 05/16/22 Arrival Time: 8502  ASSESSMENT & PLAN:  1. Vaginal discharge    -History and exam clinically consistent with bacterial vaginosis so we will treat empirically for this with Flagyl.  Swab sent to lab to confirm.  We will also send Diflucan since she often gets yeast infections after completing antibiotics.  STD cotesting will also be performed on swab, as well as blood test for HIV, RPR, hepatitis B.  We will call if results are positive.  All questions were answered and she agrees to plan.   Meds ordered this encounter  Medications   metroNIDAZOLE (FLAGYL) 500 MG tablet    Sig: Take 1 tablet (500 mg total) by mouth 2 (two) times daily.    Dispense:  14 tablet    Refill:  0   fluconazole (DIFLUCAN) 150 MG tablet    Sig: Take 1 tablet (150 mg total) by mouth once. If symptoms persist after 48 hours, can take second tablet    Dispense:  2 tablet    Refill:  0   Discharge Instructions   None     Follow-up Information     Rankins, Bill Salinas, MD.   Specialty: Family Medicine Why: If symptoms worsen Contact information: [place] [address] [phone]                  Reviewed expectations re: course of current medical issues. Questions answered. Outlined signs and symptoms indicating need for more acute intervention. Patient verbalized understanding. After Visit Summary given.   SUBJECTIVE:  Pleasant 36 year old female comes to urgent care to be evaluated for vaginal discharge.  She has had vaginal discharge off and on for about 1 month.  She is unsure of the color, somewhat pink.  It does have a foul odor.  She denies any vaginal irritation.  She has had BV in the past and wonders if she is having recurrence.  Of note, she is sexually active with 1 female partner.  She would like STD testing today.  She has an IUD for birth control.  She wonders if her IUD strings are still in place.  She denies any  fevers, abdominal pain, nausea/vomiting, dysuria.  Last menstrual period was 2 weeks ago.   Patient's last menstrual period was 04/29/2022. Past Surgical History:  Procedure Laterality Date   ESOPHAGOGASTRODUODENOSCOPY (EGD) WITH PROPOFOL N/A 05/23/2015   Procedure: ESOPHAGOGASTRODUODENOSCOPY (EGD) WITH PROPOFOL;  Surgeon: Alphonsa Overall, MD;  Location: [facility];  Service: General;  Laterality: N/A;   [place] N/A 03/29/2015   Procedure: LAPAROSCOPIC GASTRIC SLEEVE RESECTION WITH UPPER ENDO;  Surgeon: Greer Pickerel, MD;  Location: [facility];  Service: General;  Laterality: N/A;   UPPER GI ENDOSCOPY  03/29/2015   Procedure: UPPER GI ENDOSCOPY;  Surgeon: Greer Pickerel, MD;  Location: [facility];  Service: General;;   WISDOM TOOTH EXTRACTION       OBJECTIVE:  Vitals:   05/16/22 0821  BP: 129/84  Pulse: 77  Resp: 16  Temp: 98.6 F (37 C)  TempSrc: Oral  SpO2: 99%     Physical Exam Vitals reviewed.  Constitutional:      Appearance: She is not ill-appearing.  Cardiovascular:     Rate and Rhythm: Normal rate and regular rhythm.  Pulmonary:     Effort: Pulmonary effort is normal.     Breath sounds: Normal breath sounds.  Abdominal:     General: Bowel sounds are normal.  Palpations: Abdomen is soft.     Tenderness: There is no abdominal tenderness. There is no guarding.  Genitourinary:    Vagina: Normal.     Cervix: No discharge.     Comments: Performed with chaperone IUD string easily visualized at cervical os Skin:    General: Skin is warm.      Labs: Results for orders placed or performed during the hospital encounter of 05/16/22  POC Urinalysis dipstick  Result Value Ref Range   Glucose, UA NEGATIVE NEGATIVE mg/dL   Bilirubin Urine NEGATIVE NEGATIVE   Ketones, ur NEGATIVE NEGATIVE mg/dL   Specific Gravity, Urine 1.025 1.005 - 1.030   Hgb urine dipstick NEGATIVE NEGATIVE   pH 5.0 5.0 - 8.0   Protein, ur NEGATIVE NEGATIVE mg/dL    Urobilinogen, UA 1.0 0.0 - 1.0 mg/dL   Nitrite NEGATIVE NEGATIVE   Leukocytes,Ua NEGATIVE NEGATIVE  POC urine pregnancy  Result Value Ref Range   Preg Test, Ur NEGATIVE NEGATIVE   Labs Reviewed  HIV ANTIBODY (ROUTINE TESTING W REFLEX)  RPR  HEPATITIS B SURFACE ANTIGEN  POCT URINALYSIS DIPSTICK, ED / UC  POC URINE PREG, ED  CERVICOVAGINAL ANCILLARY ONLY    Imaging: No results found.   Allergies  Allergen Reactions   Bee Venom Anaphylaxis                                               Past Medical History:  Diagnosis Date   Arthritis    left knee   Diabetes mellitus without complication ([facility])    DM (diabetes mellitus), type 2 ([place]) 08/23/2014   last A1C 5.5 % per patient. 05/2015   GERD (gastroesophageal reflux disease)    Headache    Heart murmur    ECHO last friday10/7/16 see cardiology next month Nov 2016   Hydradenitis 02/09/2014   Hypertension    Pt reports episode of HTN in 2009--no longer an issue 05/2015   Hypothyroidism 08/23/2014   resolved since gastric sleeve surgery    Iron deficiency anemia, unspecified 02/09/2014   Obesity    Pneumonia    Thyroid disease     Social History   Socioeconomic History   Marital status: Divorced    Spouse name: Not on file   Number of children: Not on file   Years of education: Not on file   Highest education level: Not on file  Occupational History   Not on file  Tobacco Use   Smoking status: Never   Smokeless tobacco: Never  Vaping Use   Vaping Use: Never used  Substance and Sexual Activity   Alcohol use: Yes    Comment: occassional, not since august    Drug use: No   Sexual activity: Yes    Birth control/protection: I.U.D., Condom    Comment: IUD came out and has to be replaced 06/07/2015  Other Topics Concern   Not on file  Social History Narrative   Not on file   Social Determinants of Health   Financial Resource Strain: Not on file  Food Insecurity: Not on file  Transportation Needs: Not on file   Physical Activity: Not on file  Stress: Not on file  Social Connections: Not on file  Intimate Partner Violence: Not on file    Family History  Problem Relation Age of Onset   Heart disease Mother    Diabetes  Mother    Hypertension Mother    Pulmonary fibrosis Mother    Diabetes Father    Arthritis Father    Other Father    Heart murmur Sister    Stroke Other    Heart failure Other    Cancer Maternal Uncle        brain ca   Pulmonary fibrosis Maternal Grandmother    Heart murmur Other       Myrta Mercer, Dorian Pod, MD 05/16/22 [phone]

## 2022-05-16 NOTE — ED Triage Notes (Signed)
Pt is here for abdominal pain and cramping and vaginal discharge , pain when having vaginal intercourse on and off x month

## 2022-05-17 LAB — CERVICOVAGINAL ANCILLARY ONLY
Bacterial Vaginitis (gardnerella): POSITIVE — AB
Candida Glabrata: NEGATIVE
Candida Vaginitis: POSITIVE — AB
Chlamydia: NEGATIVE
Comment: NEGATIVE
Comment: NEGATIVE
Comment: NEGATIVE
Comment: NEGATIVE
Comment: NEGATIVE
Comment: NORMAL
Neisseria Gonorrhea: NEGATIVE
Trichomonas: NEGATIVE

## 2022-05-17 LAB — HIV ANTIBODY (ROUTINE TESTING W REFLEX): HIV Screen 4th Generation wRfx: NONREACTIVE

## 2022-05-23 LAB — HEPATITIS B SURFACE ANTIGEN

## 2022-06-01 NOTE — Addendum Note (Signed)
Encounter addended by: Dortha Kern, MD on: 06/01/2022 4:27 PM  Actions taken: Letter saved

## 2022-08-31 DIAGNOSIS — Q180 Sinus, fistula and cyst of branchial cleft: Secondary | ICD-10-CM | POA: Diagnosis not present

## 2022-11-06 IMAGING — DX DG KNEE COMPLETE 4+V*L*
4 series · 4 of 4 positions shown · non-contrast
Comparison: 03/02/2013.

CLINICAL DATA: Left knee pain for 1 week.

EXAM:
LEFT KNEE - COMPLETE 4+ VIEW

[knee ap]
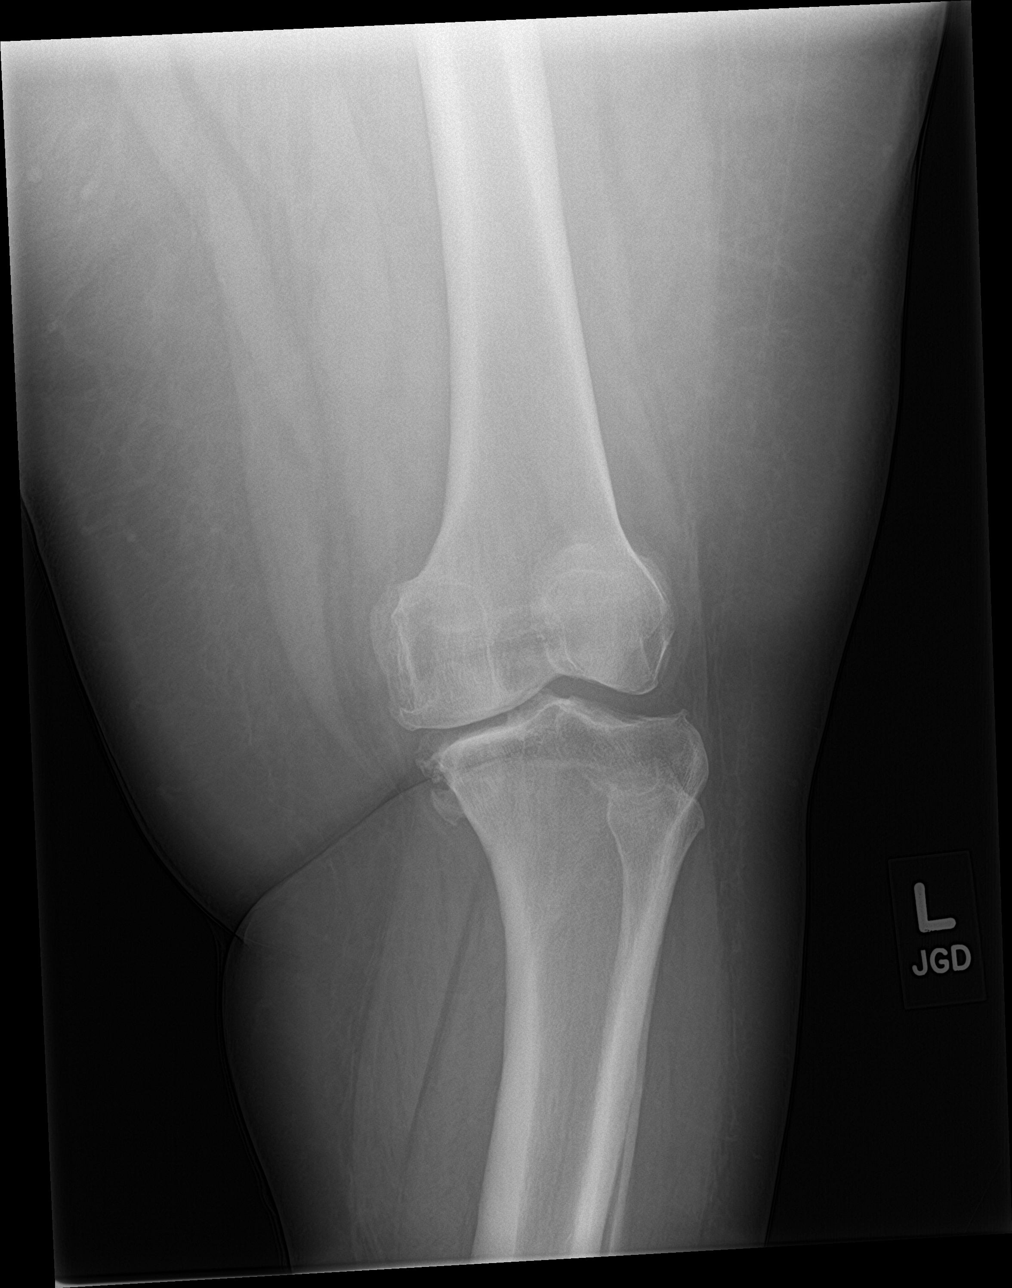

[knee obl (1 of 2)]
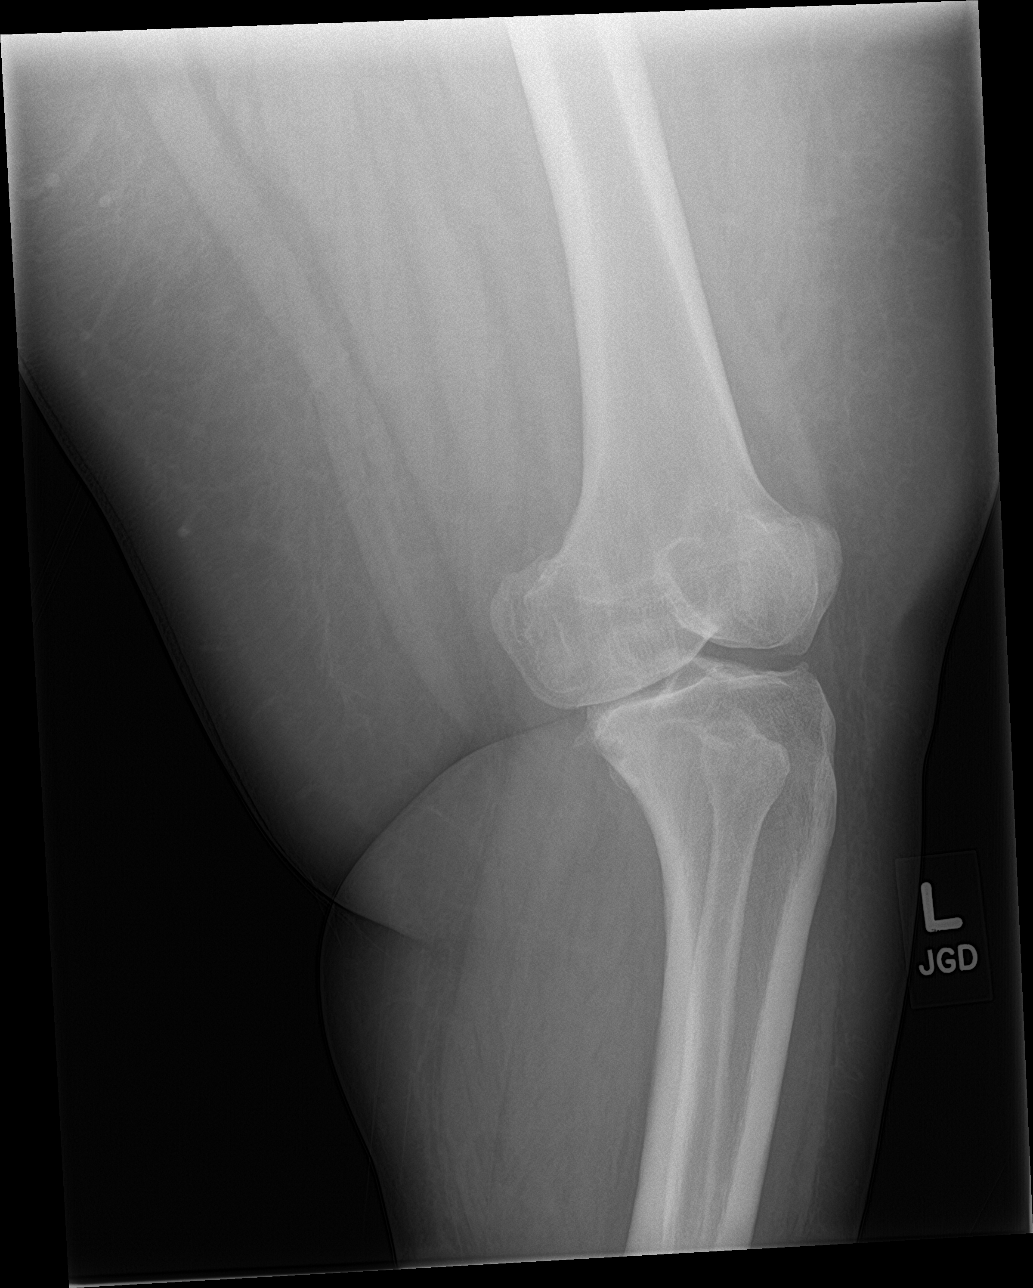

[knee obl (2 of 2)]
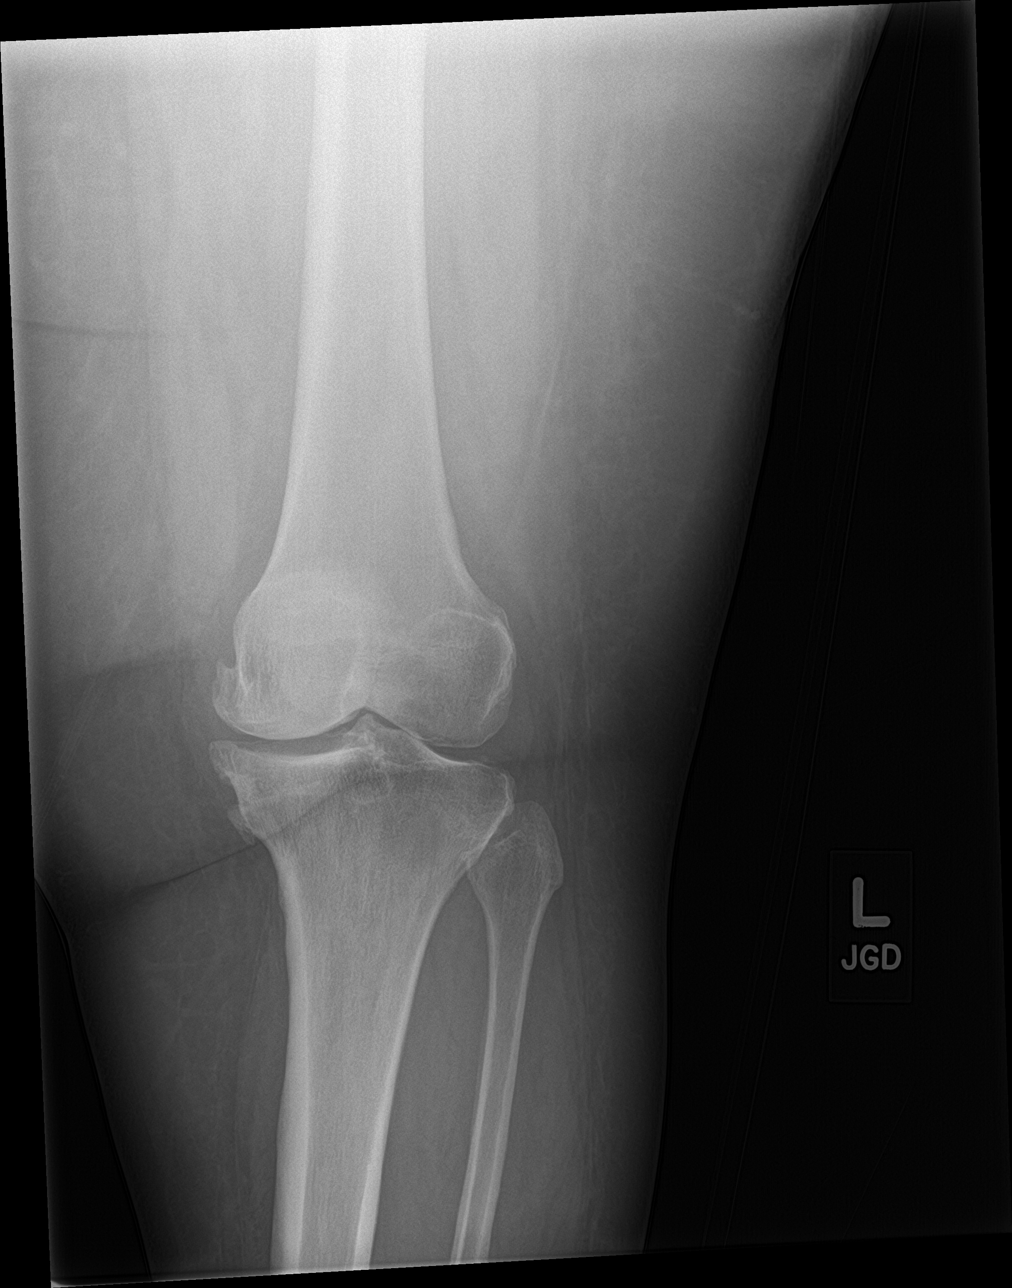

[knee lat]
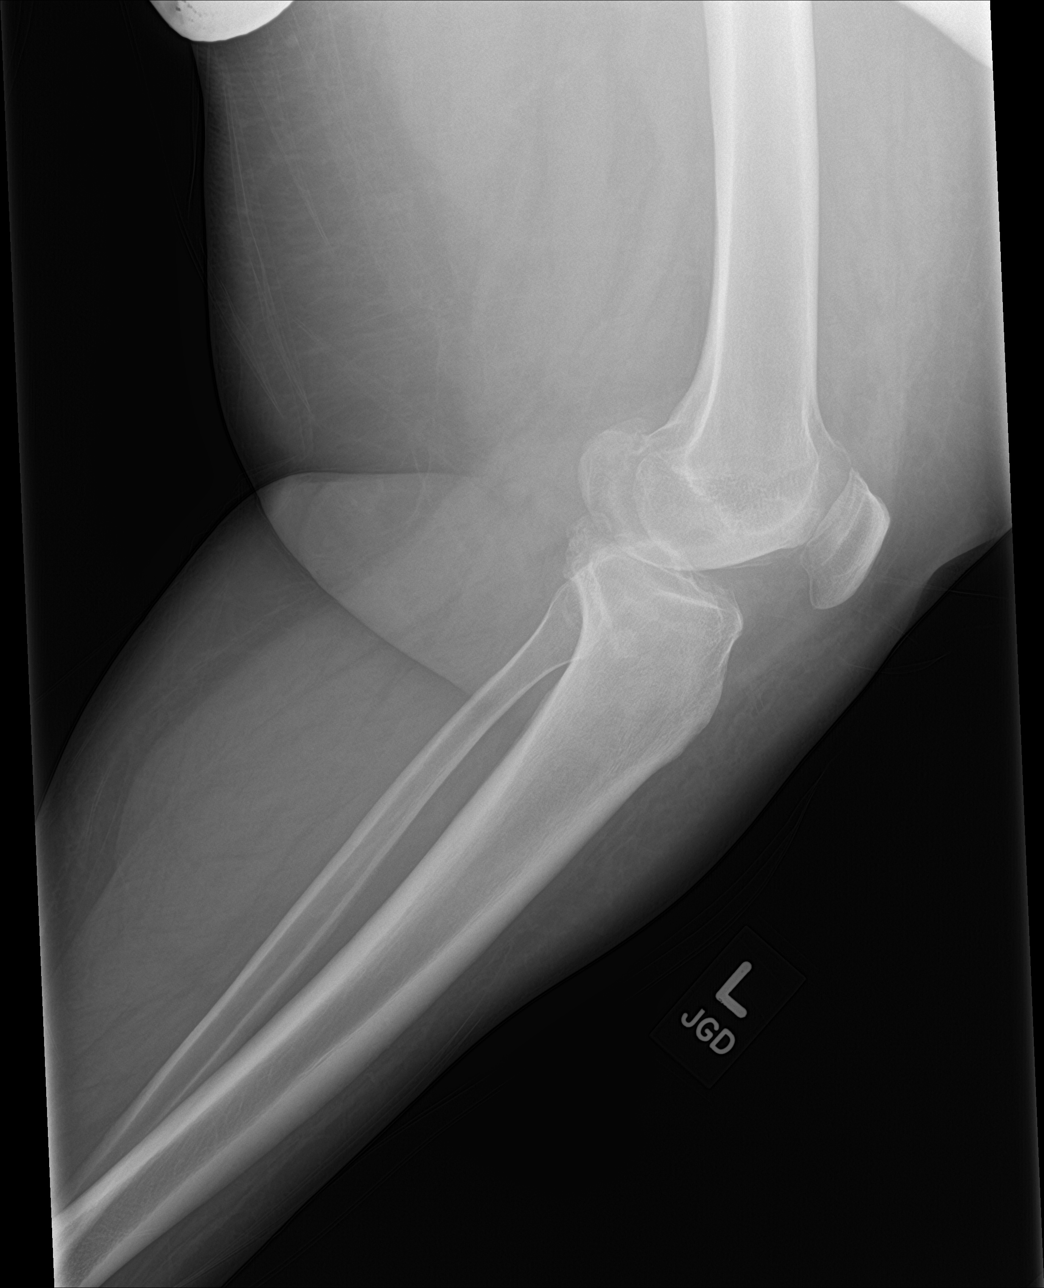

[4 of 4 positions shown; findings below may reference images not displayed]

FINDINGS: No fracture or bone lesion.

Mild narrowing of the medial joint space compartment. There are
marginal osteophytes from all 3 compartments, most prominent from
the medial compartment.

No joint effusion.

Surrounding soft tissues are unremarkable.
IMPRESSION: 1. No fracture or acute finding.
2. Osteoarthritis predominantly involving the medial compartment,
which has mildly progressed since the prior study.

## 2022-12-14 ENCOUNTER — Other Ambulatory Visit (HOSPITAL_COMMUNITY): Payer: Self-pay

## 2022-12-14 MED ORDER — OZEMPIC (0.25 OR 0.5 MG/DOSE) 2 MG/3ML ~~LOC~~ SOPN
PEN_INJECTOR | SUBCUTANEOUS | 0 refills | Status: DC
  Filled 2022-12-14: qty 3, 30d supply, fill #0

## 2022-12-14 MED ORDER — OZEMPIC (1 MG/DOSE) 4 MG/3ML ~~LOC~~ SOPN
1.0000 mg | PEN_INJECTOR | SUBCUTANEOUS | 0 refills | Status: AC
Start: 2022-12-14 — End: ?

## 2022-12-24 ENCOUNTER — Other Ambulatory Visit (HOSPITAL_COMMUNITY): Payer: Self-pay

## 2022-12-24 MED ORDER — WEGOVY 1 MG/0.5ML ~~LOC~~ SOAJ
1.0000 mg | SUBCUTANEOUS | 0 refills | Status: AC
Start: 2023-02-18 — End: ?
  Filled 2023-03-07: qty 2, 28d supply, fill #0

## 2022-12-24 MED ORDER — WEGOVY 0.5 MG/0.5ML ~~LOC~~ SOAJ
0.5000 mg | SUBCUTANEOUS | 0 refills | Status: DC
  Filled 2023-02-11 (×2): qty 2, 28d supply, fill #0

## 2022-12-24 MED ORDER — WEGOVY 0.25 MG/0.5ML ~~LOC~~ SOAJ
0.2500 mg | SUBCUTANEOUS | 0 refills | Status: AC
Start: 2022-12-24 — End: ?
  Filled 2022-12-24: qty 2, 28d supply, fill #0

## 2022-12-25 ENCOUNTER — Other Ambulatory Visit (HOSPITAL_COMMUNITY): Payer: Self-pay

## 2022-12-29 ENCOUNTER — Other Ambulatory Visit (HOSPITAL_COMMUNITY): Payer: Self-pay

## 2023-01-16 ENCOUNTER — Encounter (HOSPITAL_COMMUNITY): Payer: Self-pay

## 2023-01-16 ENCOUNTER — Other Ambulatory Visit (HOSPITAL_COMMUNITY): Payer: Self-pay

## 2023-01-16 MED ORDER — WEGOVY 0.25 MG/0.5ML ~~LOC~~ SOAJ
0.5000 mL | SUBCUTANEOUS | 1 refills | Status: DC
  Filled 2023-01-16 (×2): qty 2, 28d supply, fill #0

## 2023-01-17 ENCOUNTER — Other Ambulatory Visit (HOSPITAL_COMMUNITY): Payer: Self-pay

## 2023-01-18 ENCOUNTER — Other Ambulatory Visit (HOSPITAL_COMMUNITY): Payer: Self-pay

## 2023-02-11 ENCOUNTER — Other Ambulatory Visit (HOSPITAL_COMMUNITY): Payer: Self-pay

## 2023-02-13 ENCOUNTER — Other Ambulatory Visit (HOSPITAL_COMMUNITY): Payer: Self-pay

## 2023-02-13 MED ORDER — ALBUTEROL SULFATE HFA 108 (90 BASE) MCG/ACT IN AERS
1.0000 | INHALATION_SPRAY | RESPIRATORY_TRACT | 1 refills | Status: DC | PRN
  Filled 2023-02-13: qty 6.7, 25d supply, fill #0
  Filled 2023-02-22: qty 6.7, 17d supply, fill #0
  Filled 2023-05-06: qty 6.7, 17d supply, fill #1

## 2023-02-13 MED ORDER — METFORMIN HCL 500 MG PO TABS
500.0000 mg | ORAL_TABLET | Freq: Every day | ORAL | 3 refills | Status: DC
  Filled 2023-02-13 – 2023-02-22 (×2): qty 30, 30d supply, fill #0
  Filled 2023-03-22: qty 30, 30d supply, fill #1

## 2023-02-21 ENCOUNTER — Other Ambulatory Visit (HOSPITAL_COMMUNITY): Payer: Self-pay

## 2023-02-22 ENCOUNTER — Other Ambulatory Visit (HOSPITAL_COMMUNITY): Payer: Self-pay

## 2023-03-07 ENCOUNTER — Other Ambulatory Visit (HOSPITAL_COMMUNITY): Payer: Self-pay

## 2023-03-22 ENCOUNTER — Other Ambulatory Visit (HOSPITAL_COMMUNITY): Payer: Self-pay

## 2023-04-04 ENCOUNTER — Other Ambulatory Visit (HOSPITAL_COMMUNITY): Payer: Self-pay

## 2023-04-05 ENCOUNTER — Other Ambulatory Visit (HOSPITAL_COMMUNITY): Payer: Self-pay

## 2023-04-23 ENCOUNTER — Other Ambulatory Visit (HOSPITAL_COMMUNITY): Payer: Self-pay

## 2023-04-23 ENCOUNTER — Other Ambulatory Visit: Payer: Self-pay

## 2023-04-23 MED ORDER — TIRZEPATIDE-WEIGHT MANAGEMENT 2.5 MG/0.5ML ~~LOC~~ SOAJ
2.5000 mg | SUBCUTANEOUS | 1 refills | Status: DC
  Filled 2023-04-23 (×2): qty 2, 28d supply, fill #0

## 2023-04-23 MED ORDER — METFORMIN HCL ER 500 MG PO TB24
500.0000 mg | ORAL_TABLET | Freq: Every day | ORAL | 2 refills | Status: DC
  Filled 2023-04-23 – 2023-05-06 (×2): qty 30, 30d supply, fill #0
  Filled 2023-05-31: qty 30, 30d supply, fill #1
  Filled 2023-07-02: qty 30, 30d supply, fill #2

## 2023-04-24 ENCOUNTER — Other Ambulatory Visit (HOSPITAL_COMMUNITY): Payer: Self-pay

## 2023-05-03 ENCOUNTER — Other Ambulatory Visit (HOSPITAL_COMMUNITY): Payer: Self-pay

## 2023-05-04 ENCOUNTER — Ambulatory Visit
Admission: EM | Admit: 2023-05-04 | Discharge: 2023-05-04 | Disposition: A | Discharge status: Home / Self Care | Payer: 59 | Attending: Internal Medicine | Admitting: Internal Medicine

## 2023-05-04 DIAGNOSIS — R300 Dysuria: Secondary | ICD-10-CM | POA: Insufficient documentation

## 2023-05-04 DIAGNOSIS — Z113 Encounter for screening for infections with a predominantly sexual mode of transmission: Secondary | ICD-10-CM | POA: Insufficient documentation

## 2023-05-04 DIAGNOSIS — N760 Acute vaginitis: Secondary | ICD-10-CM | POA: Diagnosis not present

## 2023-05-04 LAB — POCT URINALYSIS DIP (MANUAL ENTRY)
Bilirubin, UA: NEGATIVE
Blood, UA: NEGATIVE
Glucose, UA: NEGATIVE mg/dL
Ketones, POC UA: NEGATIVE mg/dL
Nitrite, UA: NEGATIVE
Protein Ur, POC: NEGATIVE mg/dL
Spec Grav, UA: 1.025 (ref 1.010–1.025)
Urobilinogen, UA: 1 U/dL
pH, UA: 5.5 (ref 5.0–8.0)

## 2023-05-04 LAB — POCT URINE PREGNANCY: Preg Test, Ur: NEGATIVE

## 2023-05-04 MED ORDER — FLUCONAZOLE 150 MG PO TABS
150.0000 mg | ORAL_TABLET | ORAL | 0 refills | Status: AC
Start: 2023-05-04 — End: ?

## 2023-05-04 NOTE — Discharge Instructions (Addendum)
Please start fluconazole to address a yeast infection. Make sure you hydrate very well with plain water and a quantity of 80 ounces of water a day.  Please limit drinks that are considered urinary irritants such as soda, sweet tea, coffee, energy drinks, alcohol.  These can worsen your urinary and genital symptoms but also be the source of them.  I will let you know about your urine culture, STI testing results through MyChart to see if we need to prescribe or change your antibiotics based off of those results.

## 2023-05-04 NOTE — ED Provider Notes (Signed)
[facility]  Note:  This document was prepared using Conservation officer, historic buildings and may include unintentional dictation errors.  MRN: [id number] DOB: 1986-02-08  Subjective:   Lorraine Chambers is a 37 y.o. female presenting for 10 day history of acute onset persistent dysuria, dark urine, urinary urgency and frequency, vaginal itching and vaginal irritation/slight swelling. Has slight vaginal discharge. Patient would like STI testing.  Has a history of BV and yeast infections.  Does not drink many fluids at all due to the nature of her work.  She would like blood work for HIV and syphilis too, on oral cytology as she found out her boyfriend was cheating on her.  No current facility-administered medications for this encounter.  Current Outpatient Medications:    fluconazole (DIFLUCAN) 150 MG tablet, Take 1 tablet (150 mg total) by mouth every 3 (three) days., Disp: 2 tablet, Rfl: 0   Tirzepatide (MOUNJARO [place]), Inject into the skin., Disp: , Rfl:    acetaminophen (TYLENOL) 500 MG tablet, Take 1,000 mg by mouth as needed (pain)., Disp: , Rfl:    albuterol (VENTOLIN HFA) 108 (90 Base) MCG/ACT inhaler, Inhale 1-2 puffs into the lungs every 4 (four) hours as needed., Disp: 18 g, Rfl: 2   albuterol (VENTOLIN HFA) 108 (90 Base) MCG/ACT inhaler, Inhale 1-2 puffs into the lungs every 4 (four) hours as needed., Disp: 6.7 g, Rfl: 1   dicyclomine (BENTYL) 20 MG tablet, Take 1 tablet (20 mg total) by mouth 2 (two) times daily., Disp: 10 tablet, Rfl: 0   ipratropium-albuterol (DUONEB) 0.5-2.5 (3) MG/3ML SOLN, Take 3 mLs by nebulization every 6 (six) hours as needed., Disp: 360 mL, Rfl: 0   metFORMIN (GLUCOPHAGE-XR) 500 MG 24 hr tablet, Take 1 tablet (500 mg total) by mouth once  daily with evening meal., Disp: 30 tablet, Rfl: 2   Allergies  Allergen Reactions   Bee Venom Anaphylaxis    Past Medical History:  Diagnosis Date   Arthritis    left knee   Diabetes  mellitus without complication (HCC)    DM (diabetes mellitus), type 2 (HCC) 08/23/2014   last A1C 5.5 % per patient. 05/2015   GERD (gastroesophageal reflux disease)    Headache    Heart murmur    ECHO last friday10/7/16 see cardiology next month Nov 2016   Hydradenitis 02/09/2014   Hypertension    Pt reports episode of HTN in 2009--no longer an issue 05/2015   Hypothyroidism 08/23/2014   resolved since gastric sleeve surgery    Iron deficiency anemia, unspecified 02/09/2014   Obesity    Pneumonia    Thyroid disease      Past Surgical History:  Procedure Laterality Date   ESOPHAGOGASTRODUODENOSCOPY (EGD) WITH PROPOFOL N/A 05/23/2015   Procedure: ESOPHAGOGASTRODUODENOSCOPY (EGD) WITH PROPOFOL;  Surgeon: Ovidio Kin, MD;  Location: [facility];  Service: General;  Laterality: N/A;   LAPAROSCOPIC GASTRIC SLEEVE RESECTION N/A 03/29/2015   Procedure: LAPAROSCOPIC GASTRIC SLEEVE RESECTION WITH UPPER ENDO;  Surgeon: Gaynelle Adu, MD;  Location: [facility];  Service: General;  Laterality: N/A;   UPPER GI ENDOSCOPY  03/29/2015   Procedure: UPPER GI ENDOSCOPY;  Surgeon: Gaynelle Adu, MD;  Location: [facility];  Service: General;;   WISDOM TOOTH EXTRACTION      Family History  Problem Relation Age of Onset   Heart disease Mother    Diabetes Mother    Hypertension Mother    Pulmonary fibrosis Mother    Diabetes Father  Arthritis Father    Other Father    Heart murmur Sister    Stroke Other    Heart failure Other    Cancer Maternal Uncle        brain ca   Pulmonary fibrosis Maternal Grandmother    Heart murmur Other     Social History   Tobacco Use   Smoking status: Never   Smokeless tobacco: Never  Vaping Use   Vaping status: Never Used  Substance Use Topics   Alcohol use: Yes    Comment: occassional, not since august    Drug use: No    ROS   Objective:   Vitals: BP 124/86 (BP Location: Left Arm)   Pulse 75   Temp 98.8 F (37.1 C) (Oral)   Resp 16   SpO2 95%   Physical  Exam Constitutional:      General: She is not in acute distress.    Appearance: Normal appearance. She is well-developed. She is not ill-appearing, toxic-appearing or diaphoretic.  HENT:     Head: Normocephalic and atraumatic.     Nose: Nose normal.     Mouth/Throat:     Mouth: Mucous membranes are moist.  Eyes:     General: No scleral icterus.       Right eye: No discharge.        Left eye: No discharge.     Extraocular Movements: Extraocular movements intact.     Conjunctiva/sclera: Conjunctivae normal.  Cardiovascular:     Rate and Rhythm: Normal rate.  Pulmonary:     Effort: Pulmonary effort is normal.  Abdominal:     General: Bowel sounds are normal. There is no distension.     Palpations: Abdomen is soft. There is no mass.     Tenderness: There is no abdominal tenderness. There is no right CVA tenderness, left CVA tenderness, guarding or rebound.  Skin:    General: Skin is warm and dry.  Neurological:     General: No focal deficit present.     Mental Status: She is alert and oriented to person, place, and time.  Psychiatric:        Mood and Affect: Mood normal.        Behavior: Behavior normal.        Thought Content: Thought content normal.        Judgment: Judgment normal.     Results for orders placed or performed during the hospital encounter of 05/04/23 (from the past 24 hour(s))  POCT urinalysis dipstick     Status: Abnormal   Collection Time: 05/04/23  9:03 AM  Result Value Ref Range   Color, UA yellow yellow   Clarity, UA clear clear   Glucose, UA negative negative mg/dL   Bilirubin, UA negative negative   Ketones, POC UA negative negative mg/dL   Spec Grav, UA 8.119 1.478 - 1.025   Blood, UA negative negative   pH, UA 5.5 5.0 - 8.0   Protein Ur, POC negative negative mg/dL   Urobilinogen, UA 1.0 0.2 or 1.0 E.U./dL   Nitrite, UA Negative Negative   Leukocytes, UA Trace (A) Negative  POCT urine pregnancy     Status: None   Collection Time: 05/04/23   9:04 AM  Result Value Ref Range   Preg Test, Ur Negative Negative    Assessment and Plan :   PDMP not reviewed this encounter.  1. Acute vaginitis   2. Dysuria    We will treat patient empirically for yeast vaginitis  with fluconazole.  Labs pending.  Recommended hydrating more consistently.  Counseled patient on potential for adverse effects with medications prescribed/recommended today, ER and return-to-clinic precautions discussed, patient verbalized understanding.    Wallis Bamberg, [place] 05/04/23 [phone]

## 2023-05-04 NOTE — ED Triage Notes (Signed)
Pt reports dark urine, pain when urinating and vaginal itching x 1 1/2 week. Requested STD's test.

## 2023-05-06 ENCOUNTER — Other Ambulatory Visit: Payer: Self-pay

## 2023-05-06 ENCOUNTER — Other Ambulatory Visit (HOSPITAL_COMMUNITY): Payer: Self-pay

## 2023-05-06 LAB — URINE CULTURE: Culture: NO GROWTH

## 2023-05-06 LAB — CERVICOVAGINAL ANCILLARY ONLY
Bacterial Vaginitis (gardnerella): NEGATIVE
Candida Glabrata: NEGATIVE
Candida Vaginitis: POSITIVE — AB
Chlamydia: NEGATIVE
Comment: NEGATIVE
Comment: NEGATIVE
Comment: NEGATIVE
Comment: NEGATIVE
Comment: NEGATIVE
Comment: NORMAL
Neisseria Gonorrhea: NEGATIVE
Trichomonas: NEGATIVE

## 2023-05-06 LAB — CYTOLOGY, (ORAL, ANAL, URETHRAL) ANCILLARY ONLY
Chlamydia: NEGATIVE
Comment: NEGATIVE
Comment: NORMAL
Neisseria Gonorrhea: NEGATIVE

## 2023-05-06 MED ORDER — ZEPBOUND 2.5 MG/0.5ML ~~LOC~~ SOAJ
2.5000 mg | SUBCUTANEOUS | 1 refills | Status: AC
  Filled 2023-05-06: qty 2, 28d supply, fill #0
  Filled 2023-05-31: qty 2, 28d supply, fill #1

## 2023-05-07 ENCOUNTER — Other Ambulatory Visit (HOSPITAL_COMMUNITY): Payer: Self-pay

## 2023-05-07 LAB — RPR: RPR Ser Ql: NONREACTIVE

## 2023-05-07 LAB — HIV ANTIBODY (ROUTINE TESTING W REFLEX): HIV Screen 4th Generation wRfx: NONREACTIVE

## 2023-05-31 ENCOUNTER — Other Ambulatory Visit (HOSPITAL_COMMUNITY): Payer: Self-pay

## 2023-06-07 ENCOUNTER — Other Ambulatory Visit (HOSPITAL_COMMUNITY): Payer: Self-pay

## 2023-06-15 ENCOUNTER — Emergency Department (HOSPITAL_COMMUNITY)
Admission: EM | Admit: 2023-06-15 | Discharge: 2023-06-15 | Disposition: A | Discharge status: Home / Self Care | Payer: 59 | Attending: Emergency Medicine | Admitting: Emergency Medicine

## 2023-06-15 ENCOUNTER — Emergency Department (HOSPITAL_COMMUNITY): Payer: 59

## 2023-06-15 ENCOUNTER — Other Ambulatory Visit: Payer: Self-pay

## 2023-06-15 ENCOUNTER — Encounter (HOSPITAL_COMMUNITY): Payer: Self-pay | Admitting: Emergency Medicine

## 2023-06-15 DIAGNOSIS — M542 Cervicalgia: Secondary | ICD-10-CM | POA: Diagnosis present

## 2023-06-15 LAB — I-STAT CHEM 8, ED
BUN: 8 mg/dL (ref 6–20)
Calcium, Ion: 1.16 mmol/L (ref 1.15–1.40)
Chloride: 105 mmol/L (ref 98–111)
Creatinine, Ser: 0.6 mg/dL (ref 0.44–1.00)
Glucose, Bld: 92 mg/dL (ref 70–99)
HCT: 39 % (ref 36.0–46.0)
Hemoglobin: 13.3 g/dL (ref 12.0–15.0)
Potassium: 4 mmol/L (ref 3.5–5.1)
Sodium: 142 mmol/L (ref 135–145)
TCO2: 25 mmol/L (ref 22–32)

## 2023-06-15 MED ORDER — IOHEXOL 350 MG/ML SOLN
75.0000 mL | Freq: Once | INTRAVENOUS | Status: AC | PRN
  Administered 2023-06-15: 75 mL via INTRAVENOUS

## 2023-06-15 MED ORDER — SODIUM CHLORIDE (PF) 0.9 % IJ SOLN
INTRAMUSCULAR | Status: AC
  Filled 2023-06-15: qty 50
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
## 2023-07-02 ENCOUNTER — Other Ambulatory Visit (HOSPITAL_COMMUNITY): Payer: Self-pay

## 2023-07-03 ENCOUNTER — Other Ambulatory Visit (HOSPITAL_BASED_OUTPATIENT_CLINIC_OR_DEPARTMENT_OTHER): Payer: Self-pay

## 2023-07-03 ENCOUNTER — Other Ambulatory Visit (HOSPITAL_COMMUNITY): Payer: Self-pay

## 2023-07-03 MED ORDER — ZEPBOUND 5 MG/0.5ML ~~LOC~~ SOAJ
5.0000 mg | SUBCUTANEOUS | 0 refills | Status: AC
  Filled 2023-07-03: qty 2, 28d supply, fill #0

## 2023-07-03 MED ORDER — METFORMIN HCL ER 500 MG PO TB24
500.0000 mg | ORAL_TABLET | Freq: Every day | ORAL | 0 refills | Status: AC
Start: 2023-07-03 — End: ?
  Filled 2023-07-03: qty 90, 90d supply, fill #0
  Filled 2023-07-03 – 2023-07-05 (×2): qty 30, 30d supply, fill #0

## 2023-07-05 ENCOUNTER — Other Ambulatory Visit (HOSPITAL_COMMUNITY): Payer: Self-pay

## 2023-07-11 ENCOUNTER — Other Ambulatory Visit (HOSPITAL_COMMUNITY): Payer: Self-pay

## 2023-07-29 ENCOUNTER — Other Ambulatory Visit (HOSPITAL_COMMUNITY): Payer: Self-pay

## 2023-07-29 MED ORDER — METFORMIN HCL ER 500 MG PO TB24
500.0000 mg | ORAL_TABLET | Freq: Every day | ORAL | 1 refills | Status: AC
Start: 2023-07-29 — End: ?
  Filled 2023-08-02: qty 30, 30d supply, fill #0
  Filled 2023-09-06 – 2023-09-09 (×2): qty 30, 30d supply, fill #1

## 2023-07-29 MED ORDER — ALBUTEROL SULFATE HFA 108 (90 BASE) MCG/ACT IN AERS
1.0000 | INHALATION_SPRAY | RESPIRATORY_TRACT | 1 refills | Status: DC | PRN
  Filled 2023-07-29: qty 6.7, 17d supply, fill #0
  Filled 2023-09-06: qty 6.7, 17d supply, fill #1

## 2023-07-30 ENCOUNTER — Other Ambulatory Visit (HOSPITAL_COMMUNITY): Payer: Self-pay

## 2023-08-02 ENCOUNTER — Other Ambulatory Visit (HOSPITAL_COMMUNITY): Payer: Self-pay

## 2023-08-05 ENCOUNTER — Other Ambulatory Visit (HOSPITAL_COMMUNITY): Payer: Self-pay

## 2023-08-14 ENCOUNTER — Other Ambulatory Visit (HOSPITAL_COMMUNITY): Payer: Self-pay

## 2023-08-14 MED ORDER — ZEPBOUND 7.5 MG/0.5ML ~~LOC~~ SOAJ
7.5000 mg | SUBCUTANEOUS | 1 refills | Status: AC
Start: 2023-08-14 — End: ?
  Filled 2023-08-14: qty 2, 28d supply, fill #0
  Filled 2023-09-06: qty 2, 28d supply, fill #1

## 2023-09-06 ENCOUNTER — Other Ambulatory Visit (HOSPITAL_COMMUNITY): Payer: Self-pay

## 2023-09-09 ENCOUNTER — Other Ambulatory Visit (HOSPITAL_COMMUNITY): Payer: Self-pay

## 2023-10-09 ENCOUNTER — Ambulatory Visit
Admission: EM | Admit: 2023-10-09 | Discharge: 2023-10-09 | Disposition: A | Discharge status: Home / Self Care | Attending: Internal Medicine | Admitting: Internal Medicine

## 2023-10-09 DIAGNOSIS — Z711 Person with feared health complaint in whom no diagnosis is made: Secondary | ICD-10-CM

## 2023-10-09 DIAGNOSIS — L723 Sebaceous cyst: Secondary | ICD-10-CM | POA: Diagnosis not present

## 2023-10-09 MED ORDER — DOXYCYCLINE HYCLATE 100 MG PO CAPS: 100.0000 mg | ORAL_CAPSULE | Freq: Two times a day (BID) | ORAL | 0 refills | Status: AC

## 2023-10-09 NOTE — ED Provider Notes (Signed)
 [facility]    CSN: [id number] Arrival date & time: 10/09/23  1914      History   Chief Complaint Chief Complaint  Patient presents with   Exposure to STD   Abscess    HPI Lorraine Chambers is a 38 y.o. female.   Patient states that she has been experiencing painful abscess to right and left buttock x 1 to 2 weeks.  Patient reports initially started on the left internal gluteal fold but this week has developed an abscess to the right lower buttock.  Patient reports history of pilonidal cysts and sebaceous cyst.  Patient reports moderate pain with palpation, no draining, no bleeding, no fever.  Patient also concern for STI stating that she has history of HSV and was concerned that possibly she had developed an infection secondary to anal intercourse.  Patient would like STD testing today in clinic as well as evaluation for if she needs to have abscess drained.  Patient has not taken any over-the-counter medication or previously prescribed antibiotics for treatment.   Exposure to STD  Abscess   Past Medical History:  Diagnosis Date   Arthritis    left knee   Diabetes mellitus without complication (HCC)    DM (diabetes mellitus), type 2 (HCC) 08/23/2014   last A1C 5.5 % per patient. 05/2015   GERD (gastroesophageal reflux disease)    Headache    Heart murmur    ECHO last friday10/7/16 see cardiology next month Nov 2016   Hydradenitis 02/09/2014   Hypertension    Pt reports episode of HTN in 2009--no longer an issue 05/2015   Hypothyroidism 08/23/2014   resolved since gastric sleeve surgery    Iron deficiency anemia, unspecified 02/09/2014   Obesity    Pneumonia    Thyroid disease     Patient Active Problem List   Diagnosis Date Noted   Vaginal discharge 01/18/2022   Lower abdominal pain 01/18/2022   Obesity 03/09/2016   Gastric outlet obstruction 05/21/2015   Fatty liver disease, nonalcoholic 03/29/2015   S/P laparoscopic sleeve gastrectomy 03/29/2015    Hypothyroidism 08/23/2014   DM (diabetes mellitus), type 2 (HCC) 08/23/2014   Hydradenitis 02/09/2014   Iron deficiency anemia 02/09/2014   Reactive thrombocytosis 02/09/2014   Immune to hepatitis B 09/24/2013   Positive hepatitis C antibody test 09/24/2013   Hepatitis C antibody test positive 09/24/2013   Herpes simplex virus (HSV) infection 07/07/2008    Past Surgical History:  Procedure Laterality Date   ESOPHAGOGASTRODUODENOSCOPY (EGD) WITH PROPOFOL N/A 05/23/2015   Procedure: ESOPHAGOGASTRODUODENOSCOPY (EGD) WITH PROPOFOL;  Surgeon: Ovidio Kin, MD;  Location: [facility];  Service: General;  Laterality: N/A;   LAPAROSCOPIC GASTRIC SLEEVE RESECTION N/A 03/29/2015   Procedure: LAPAROSCOPIC GASTRIC SLEEVE RESECTION WITH UPPER ENDO;  Surgeon: Gaynelle Adu, MD;  Location: [facility];  Service: General;  Laterality: N/A;   UPPER GI ENDOSCOPY  03/29/2015   Procedure: UPPER GI ENDOSCOPY;  Surgeon: Gaynelle Adu, MD;  Location: [facility];  Service: General;;   WISDOM TOOTH EXTRACTION      OB History   No obstetric history on file.      Home Medications    Prior to Admission medications   Medication Sig Start Date End Date Taking? Authorizing Provider  doxycycline (VIBRAMYCIN) 100 MG capsule Take 1 capsule (100 mg total) by mouth 2 (two) times daily. 10/09/23  Yes Krissie Merrick B, NP  etonogestrel (NEXPLANON) 68 MG IMPL implant 1 each by Subdermal route once.   Yes [provider]  acetaminophen (TYLENOL) 500 MG tablet Take 1,000 mg by mouth as needed (pain).    [provider]  albuterol (VENTOLIN HFA) 108 (90 Base) MCG/ACT inhaler Inhale 1-2 puffs into the lungs every 4 (four) hours as needed. 01/10/22     albuterol (VENTOLIN HFA) 108 (90 Base) MCG/ACT inhaler Inhale 1-2 puffs into the lungs every 4 (four) hours as needed. 07/29/23     dicyclomine (BENTYL) 20 MG tablet Take 1 tablet (20 mg total) by mouth 2 (two) times daily. 06/27/20   Khatri, Hina, PA-C  fluconazole  (DIFLUCAN) 150 MG tablet Take 1 tablet (150 mg total) by mouth every 3 (three) days. 05/04/23   Wallis Bamberg, PA-C  ipratropium-albuterol (DUONEB) 0.5-2.5 (3) MG/3ML SOLN Take 3 mLs by nebulization every 6 (six) hours as needed. 11/28/20   Ivette Loyal, NP  metFORMIN (GLUCOPHAGE-XR) 500 MG 24 hr tablet Take 1 tablet (500 mg total) by mouth daily with supper. 07/03/23     metFORMIN (GLUCOPHAGE-XR) 500 MG 24 hr tablet Take 1 tablet (500 mg total) by mouth daily with supper. 07/29/23     Tirzepatide (MOUNJARO [place]) Inject into the skin.    [provider]  tirzepatide (ZEPBOUND) 2.5 MG/0.5ML Pen Inject 2.5 mg into the skin once a week. 05/06/23     tirzepatide (ZEPBOUND) 5 MG/0.5ML Pen Inject 5 mg into the skin once a week. 07/03/23     tirzepatide (ZEPBOUND) 7.5 MG/0.5ML Pen Inject 7.5 mg into the skin once a week. 08/14/23     promethazine (PHENERGAN) 25 MG tablet Take 1 tablet (25 mg total) by mouth every 6 (six) hours as needed for nausea or vomiting. Patient not taking: Reported on 05/17/2019 01/02/19 12/13/19  Antony Madura, PA-C    Family History Family History  Problem Relation Age of Onset   Heart disease Mother    Diabetes Mother    Hypertension Mother    Pulmonary fibrosis Mother    Diabetes Father    Arthritis Father    Other Father    Heart murmur Sister    Stroke Other    Heart failure Other    Cancer Maternal Uncle        brain ca   Pulmonary fibrosis Maternal Grandmother    Heart murmur Other     Social History Social History   Tobacco Use   Smoking status: Never   Smokeless tobacco: Never  Vaping Use   Vaping status: Never Used  Substance Use Topics   Alcohol use: Yes    Comment: occassional, not since august    Drug use: No     Allergies   Bee venom   Review of Systems Review of Systems Refer to HPI for all pertinent ROS details.  Physical Exam Triage Vital Signs ED Triage Vitals [10/09/23 0838]  Encounter Vitals Group     BP 138/86      Systolic BP Percentile      Diastolic BP Percentile      Pulse Rate 68     Resp 16     Temp 98.6 F (37 C)     Temp Source Oral     SpO2 98 %     Weight      Height      Head Circumference      Peak Flow      Pain Score      Pain Loc      Pain Education      Exclude from Growth Chart  No data found.  Updated Vital Signs BP 138/86 (BP Location: Right Arm)   Pulse 68   Temp 98.6 F (37 C) (Oral)   Resp 16   SpO2 98%     Physical Exam Vitals and nursing note reviewed.  Constitutional:      General: She is not in acute distress.    Appearance: Normal appearance. She is not ill-appearing or toxic-appearing.  Cardiovascular:     Rate and Rhythm: Normal rate.  Pulmonary:     Effort: Pulmonary effort is normal. No respiratory distress.  Genitourinary:    Rectum: Normal.  Skin:    General: Skin is warm and dry.     Findings: Abscess and erythema present. No wound.     Comments: 2 small sebaceous cyst/abscesses noted on exam, 1 to the internal left gluteal fold second to the lower right buttock, no significant surrounding erythema, warmth, drainage, bleeding.  Moderate increase in pain with palpation.  Neurological:     General: No focal deficit present.     Mental Status: She is alert and oriented to person, place, and time.  Psychiatric:        Mood and Affect: Mood normal.        Behavior: Behavior normal.        Thought Content: Thought content normal.        Judgment: Judgment normal.      UC Treatments / Results  Labs (all labs ordered are listed, but only abnormal results are displayed) Labs Reviewed  HIV ANTIBODY (ROUTINE TESTING W REFLEX)  CERVICOVAGINAL ANCILLARY ONLY    EKG   Radiology No results found.  Procedures Procedures (including critical care time)  Medications Ordered in UC Medications - No data to display  Initial Impression / Assessment and Plan / UC Course  I have reviewed the triage vital signs and the nursing  notes.  Pertinent labs & imaging results that were available during my care of the patient were reviewed by me and considered in my medical decision making (see chart for details).    STI testing -Order placed for trichomonas, bacterial vaginosis, yeast infection, GC/chlamydia today in urgent care.  Patient perform self swab. -Results of testing should be available in 2 to 3 days.  If any abnormal findings patient be contacted by nursing staff. -If any further medical concerns follow-up for further evaluation and management  Sebaceous cyst -Sebaceous cyst evaluated in UC did not appear to be draining and does not have significant accumulation for need of I&D. -Doxycycline 100 mg twice daily for 10 days prescribed for treatment of sebaceous abscess. -If symptoms persist or progress follow-up for further evaluation and management.  Final Clinical Impressions(s) / UC Diagnoses   Final diagnoses:  Concern about STD in female without diagnosis  Sebaceous cyst     Discharge Instructions       STI testing -Order placed for trichomonas, bacterial vaginosis, yeast infection, GC/chlamydia today in urgent care.  Patient perform self swab. -Results of testing should be available in 2 to 3 days.  If any abnormal findings patient be contacted by nursing staff. -If any further medical concerns follow-up for further evaluation and management  Sebaceous cyst -Sebaceous cyst evaluated in UC did not appear to be draining and does not have significant accumulation for need of I&D. -Doxycycline 100 mg twice daily for 10 days prescribed for treatment of sebaceous abscess. -If symptoms persist or progress follow-up for further evaluation and management.     ED Prescriptions  Medication Sig Dispense Auth. Provider   doxycycline (VIBRAMYCIN) 100 MG capsule Take 1 capsule (100 mg total) by mouth 2 (two) times daily. 20 capsule Damauri Minion B, NP      PDMP not reviewed this encounter.    Pola Corn B, NP 10/09/23 [phone]

## 2023-10-09 NOTE — ED Triage Notes (Signed)
 Pt reports she has an abscess in the right buttock x 2 week.  Pt requested STD's test.

## 2023-10-09 NOTE — Discharge Instructions (Signed)
  STI testing -Order placed for trichomonas, bacterial vaginosis, yeast infection, GC/chlamydia today in urgent care.  Patient perform self swab. -Results of testing should be available in 2 to 3 days.  If any abnormal findings patient be contacted by nursing staff. -If any further medical concerns follow-up for further evaluation and management  Sebaceous cyst -Sebaceous cyst evaluated in UC did not appear to be draining and does not have significant accumulation for need of I&D. -Doxycycline 100 mg twice daily for 10 days prescribed for treatment of sebaceous abscess. -If symptoms persist or progress follow-up for further evaluation and management.

## 2023-10-10 ENCOUNTER — Encounter (HOSPITAL_COMMUNITY): Payer: Self-pay

## 2023-10-10 LAB — CERVICOVAGINAL ANCILLARY ONLY
Bacterial Vaginitis (gardnerella): NEGATIVE
Candida Glabrata: NEGATIVE
Candida Vaginitis: NEGATIVE
Chlamydia: NEGATIVE
Comment: NEGATIVE
Comment: NEGATIVE
Comment: NEGATIVE
Comment: NEGATIVE
Comment: NEGATIVE
Comment: NORMAL
Neisseria Gonorrhea: NEGATIVE
Trichomonas: NEGATIVE

## 2023-10-10 LAB — HIV ANTIBODY (ROUTINE TESTING W REFLEX): HIV Screen 4th Generation wRfx: NONREACTIVE

## 2023-10-16 ENCOUNTER — Other Ambulatory Visit (HOSPITAL_COMMUNITY): Payer: Self-pay

## 2023-10-16 MED ORDER — ZEPBOUND 10 MG/0.5ML ~~LOC~~ SOAJ
10.0000 mg | SUBCUTANEOUS | 0 refills | Status: DC
Start: 2023-10-16 — End: 2023-11-14
  Filled 2023-10-16: qty 2, 28d supply, fill #0

## 2023-10-17 ENCOUNTER — Other Ambulatory Visit (HOSPITAL_COMMUNITY): Payer: Self-pay

## 2023-11-14 ENCOUNTER — Other Ambulatory Visit (HOSPITAL_COMMUNITY): Payer: Self-pay

## 2023-11-14 MED ORDER — ZEPBOUND 10 MG/0.5ML ~~LOC~~ SOAJ
10.0000 mg | SUBCUTANEOUS | 0 refills | Status: AC
  Filled 2023-11-14 – 2023-11-27 (×2): qty 2, 28d supply, fill #0

## 2023-11-15 ENCOUNTER — Other Ambulatory Visit (HOSPITAL_COMMUNITY): Payer: Self-pay

## 2023-11-22 ENCOUNTER — Ambulatory Visit
Admission: EM | Admit: 2023-11-22 | Discharge: 2023-11-22 | Disposition: A | Discharge status: Home / Self Care | Attending: Nurse Practitioner | Admitting: Nurse Practitioner

## 2023-11-22 DIAGNOSIS — N939 Abnormal uterine and vaginal bleeding, unspecified: Secondary | ICD-10-CM | POA: Diagnosis present

## 2023-11-22 DIAGNOSIS — K629 Disease of anus and rectum, unspecified: Secondary | ICD-10-CM | POA: Diagnosis not present

## 2023-11-22 LAB — POCT URINALYSIS DIP (MANUAL ENTRY)
Glucose, UA: NEGATIVE mg/dL
Ketones, POC UA: NEGATIVE mg/dL
Nitrite, UA: NEGATIVE
Protein Ur, POC: NEGATIVE mg/dL
Spec Grav, UA: 1.02 (ref 1.010–1.025)
Urobilinogen, UA: 2 U/dL — AB
pH, UA: 7.5 (ref 5.0–8.0)

## 2023-11-22 LAB — POCT URINE PREGNANCY: Preg Test, Ur: NEGATIVE

## 2023-11-22 NOTE — Discharge Instructions (Addendum)
 Abnormal uterine bleeding is any bleeding from the uterus that is different from your normal menstrual cycle. This may include bleeding between periods, very heavy periods, periods that last longer than usual, or bleeding after menopause. There are many possible causes, including hormone changes, stress, certain medications, infections, fibroids, or other medical conditions. It's important to track your bleeding and report any changes to your healthcare provider. Depending on the cause, treatment may include medication, lifestyle changes, or further testing. If you experience very heavy bleeding, dizziness, or severe pain, seek medical attention right away.

## 2023-11-22 NOTE — ED Provider Notes (Signed)
 [facility]    CSN: [id number] Arrival date & time: 11/22/23  0834      History   Chief Complaint Chief Complaint  Patient presents with   Vaginal Bleeding    HPI Lorraine Chambers is a 38 y.o. female.     Subjective:  Lorraine Chambers is a 38 year old female that presents with complaints of abnormal vaginal bleeding. She reports that her last menstrual period in March was approximately two weeks late. When it started, it was lighter than usual and lasted only three days, whereas her typical cycles last 4 to 5 days and are much heavier. About a week and a half later, she began experiencing intermittent spotting with brown discharge. One day ago, she noticed bright red blood when wiping, though there was no blood noted in her underwear. She also reports lower abdominal cramping similar to her usual menstrual cramps and some urinary urgency. She denies any vaginal odor, itching, irritation, dysuria, nausea, or vomiting. She is sexually active with one female partner and denies any new partners within the past three months. She does not use condoms.  She was seen on 10/09/2023 for evaluation of a sebaceous cyst on the left gluteal area. Incision and drainage were not indicated at that time. She was prescribed a 10-day course of doxycycline  but only took it for three days due to nausea and vomiting. The cyst is still present and continues to cause discomfort when sitting. She denies spontaneous drainage or fevers.  The following portions of the patient's history were reviewed and updated as appropriate: allergies, current medications, past family history, past medical history, past social history, past surgical history, and problem list.      Past Medical History:  Diagnosis Date   Arthritis    left knee   Diabetes mellitus without complication (HCC)    DM (diabetes mellitus), type 2 (HCC) 08/23/2014   last A1C 5.5 % per patient. 05/2015   GERD (gastroesophageal  reflux disease)    Headache    Heart murmur    ECHO last friday10/7/16 see cardiology next month Nov 2016   Hydradenitis 02/09/2014   Hypertension    Pt reports episode of HTN in 2009--no longer an issue 05/2015   Hypothyroidism 08/23/2014   resolved since gastric sleeve surgery    Iron deficiency anemia, unspecified 02/09/2014   Obesity    Pneumonia    Thyroid  disease     Patient Active Problem List   Diagnosis Date Noted   Vaginal discharge 01/18/2022   Lower abdominal pain 01/18/2022   Obesity 03/09/2016   Gastric outlet obstruction 05/21/2015   Fatty liver disease, nonalcoholic 03/29/2015   S/P laparoscopic sleeve gastrectomy 03/29/2015   Hypothyroidism 08/23/2014   DM (diabetes mellitus), type 2 (HCC) 08/23/2014   Hydradenitis 02/09/2014   Iron deficiency anemia 02/09/2014   Reactive thrombocytosis 02/09/2014   Immune to hepatitis B 09/24/2013   Positive hepatitis C antibody test 09/24/2013   Hepatitis C antibody test positive 09/24/2013   Herpes simplex virus (HSV) infection 07/07/2008    Past Surgical History:  Procedure Laterality Date   ESOPHAGOGASTRODUODENOSCOPY (EGD) WITH PROPOFOL  N/A 05/23/2015   Procedure: ESOPHAGOGASTRODUODENOSCOPY (EGD) WITH PROPOFOL ;  Surgeon: Alm Angle, MD;  Location: [facility];  Service: General;  Laterality: N/A;   LAPAROSCOPIC GASTRIC SLEEVE RESECTION N/A 03/29/2015   Procedure: LAPAROSCOPIC GASTRIC SLEEVE RESECTION WITH UPPER ENDO;  Surgeon: Camellia Blush, MD;  Location: [facility];  Service: General;  Laterality: N/A;   UPPER GI ENDOSCOPY  03/29/2015  Procedure: UPPER GI ENDOSCOPY;  Surgeon: Camellia Blush, MD;  Location: [facility];  Service: General;;   WISDOM TOOTH EXTRACTION      OB History   No obstetric history on file.      Home Medications    Prior to Admission medications   Medication Sig Start Date End Date Taking? Authorizing Provider  acetaminophen  (TYLENOL ) 500 MG tablet Take 1,000 mg by mouth as needed (pain).    [provider]  albuterol  (VENTOLIN  HFA) 108 (90 Base) MCG/ACT inhaler Inhale 1-2 puffs into the lungs every 4 (four) hours as needed. 01/10/22     albuterol  (VENTOLIN  HFA) 108 (90 Base) MCG/ACT inhaler Inhale 1-2 puffs into the lungs every 4 (four) hours as needed. 07/29/23     dicyclomine  (BENTYL ) 20 MG tablet Take 1 tablet (20 mg total) by mouth 2 (two) times daily. 06/27/20   Khatri, Hina, PA-C  doxycycline  (VIBRAMYCIN ) 100 MG capsule Take 1 capsule (100 mg total) by mouth 2 (two) times daily. 10/09/23   Reddick, Johnathan B, NP  etonogestrel (NEXPLANON) 68 MG IMPL implant 1 each by Subdermal route once.    [provider]  fluconazole  (DIFLUCAN ) 150 MG tablet Take 1 tablet (150 mg total) by mouth every 3 (three) days. 05/04/23   Christopher Savannah, PA-C  ipratropium-albuterol  (DUONEB) 0.5-2.5 (3) MG/3ML SOLN Take 3 mLs by nebulization every 6 (six) hours as needed. 11/28/20   Smith, Fowler R, NP  metFORMIN  (GLUCOPHAGE -XR) 500 MG 24 hr tablet Take 1 tablet (500 mg total) by mouth daily with supper. 07/03/23     metFORMIN  (GLUCOPHAGE -XR) 500 MG 24 hr tablet Take 1 tablet (500 mg total) by mouth daily with supper. 07/29/23     Tirzepatide  (MOUNJARO  [place]) Inject into the skin.    [provider]  tirzepatide  (ZEPBOUND ) 10 MG/0.5ML Pen Inject 10 mg into the skin once a week. 11/14/23     tirzepatide  (ZEPBOUND ) 2.5 MG/0.5ML Pen Inject 2.5 mg into the skin once a week. 05/06/23     tirzepatide  (ZEPBOUND ) 5 MG/0.5ML Pen Inject 5 mg into the skin once a week. 07/03/23     tirzepatide  (ZEPBOUND ) 7.5 MG/0.5ML Pen Inject 7.5 mg into the skin once a week. 08/14/23     promethazine  (PHENERGAN ) 25 MG tablet Take 1 tablet (25 mg total) by mouth every 6 (six) hours as needed for nausea or vomiting. Patient not taking: Reported on 05/17/2019 01/02/19 12/13/19  Keith Sor, PA-C    Family History Family History  Problem Relation Age of Onset   Heart disease Mother    Diabetes Mother    Hypertension Mother     Pulmonary fibrosis Mother    Diabetes Father    Arthritis Father    Other Father    Heart murmur Sister    Stroke Other    Heart failure Other    Cancer Maternal Uncle        brain ca   Pulmonary fibrosis Maternal Grandmother    Heart murmur Other     Social History Social History   Tobacco Use   Smoking status: Never   Smokeless tobacco: Never  Vaping Use   Vaping status: Never Used  Substance Use Topics   Alcohol use: Yes    Comment: occassional, not since august    Drug use: No     Allergies   Bee venom   Review of Systems Review of Systems  Constitutional:  Negative for fever.  Gastrointestinal:  Positive for abdominal pain. Negative for  nausea and vomiting.  Genitourinary:  Positive for menstrual problem, urgency and vaginal bleeding. Negative for dyspareunia, dysuria, genital sores, vaginal discharge and vaginal pain.  Skin:  Positive for wound.  All other systems reviewed and are negative.    Physical Exam Triage Vital Signs ED Triage Vitals  Encounter Vitals Group     BP 11/22/23 0912 115/80     Systolic BP Percentile --      Diastolic BP Percentile --      Pulse Rate 11/22/23 0912 68     Resp 11/22/23 0912 16     Temp 11/22/23 0912 98.3 F (36.8 C)     Temp Source 11/22/23 0912 Oral     SpO2 11/22/23 0912 98 %     Weight --      Height --      Head Circumference --      Peak Flow --      Pain Score 11/22/23 0911 3     Pain Loc --      Pain Education --      Exclude from Growth Chart --    No data found.  Updated Vital Signs BP 115/80   Pulse 68   Temp 98.3 F (36.8 C) (Oral)   Resp 16   LMP 10/26/2023 (Exact Date)   SpO2 98%   Visual Acuity Right Eye Distance:   Left Eye Distance:   Bilateral Distance:    Right Eye Near:   Left Eye Near:    Bilateral Near:     Physical Exam Vitals reviewed. Exam conducted with a chaperone present.  Constitutional:      General: She is not in acute distress.    Appearance: Normal  appearance. She is not ill-appearing, toxic-appearing or diaphoretic.  HENT:     Head: Normocephalic.     Nose: Nose normal.     Mouth/Throat:     Mouth: Mucous membranes are moist.  Eyes:     Conjunctiva/sclera: Conjunctivae normal.  Cardiovascular:     Rate and Rhythm: Normal rate.  Pulmonary:     Effort: Pulmonary effort is normal.  Abdominal:     General: There is no distension.     Palpations: Abdomen is soft.     Tenderness: There is no abdominal tenderness. There is no right CVA tenderness or left CVA tenderness.  Genitourinary:    General: Normal vulva.     Vagina: Bleeding present. No vaginal discharge, erythema, tenderness or lesions.       Comments: External genital exam was performed. Blood was present but no active bleeding observed. No external lesions or discharge noted. The patient completed a self-swab for Aptima testing. A small, tender lesion was palpated in the left perianal region. It was not visible on inspection and showed no signs of swelling, redness, or fluctuance. The lesion only detectable by palpation. Musculoskeletal:        General: Normal range of motion.     Cervical back: Normal range of motion and neck supple.  Skin:    General: Skin is warm and dry.  Neurological:     General: No focal deficit present.     Mental Status: She is alert and oriented to person, place, and time.  Psychiatric:        Mood and Affect: Mood normal.        Behavior: Behavior normal.      UC Treatments / Results  Labs (all labs ordered are listed, but only abnormal results are displayed) Labs  Reviewed  POCT URINALYSIS DIP (MANUAL ENTRY) - Abnormal; Notable for the following components:      Result Value   Color, UA straw (*)    Clarity, UA hazy (*)    Bilirubin, UA small (*)    Blood, UA large (*)    Urobilinogen, UA 2.0 (*)    Leukocytes, UA Trace (*)    All other components within normal limits  POCT URINE PREGNANCY  CERVICOVAGINAL ANCILLARY ONLY     EKG   Radiology No results found.  Procedures Procedures (including critical care time)  Medications Ordered in UC Medications - No data to display  Initial Impression / Assessment and Plan / UC Course  I have reviewed the triage vital signs and the nursing notes.  Pertinent labs & imaging results that were available during my care of the patient were reviewed by me and considered in my medical decision making (see chart for details).    38 year old female presents with abnormal vaginal bleeding, lower abdominal cramping, and urinary urgency. She denies fever, vaginal odor, itching, irritation, dysuria, nausea, or vomiting. She also requested evaluation of the left gluteal area where she was previously diagnosed with a sebaceous cyst. She had been prescribed doxycycline  but was unable to complete the course due to intolerance. Patient is afebrile and nontoxic. Physical exam as documented above. A limited GU exam was completed with a chaperone present. Blood was noted on the external vaginal area, though no active bleeding was observed. No external lesions or discharge were seen. The patient completed a self-swab for Aptima testing. On exam of the left perianal region, a small, tender lesion was palpated. It was not visible on inspection and showed no signs of swelling, erythema, or fluctuance. Urinalysis revealed blood but no other findings suggestive of acute infection. Urine pregnancy test was negative. Patient was advised to monitor her symptoms closely. Return precautions were discussed, and results of the Aptima swab are pending.  Today's evaluation has revealed no signs of a dangerous process. Discussed diagnosis with patient and/or guardian. Patient and/or guardian aware of their diagnosis, possible red flag symptoms to watch out for and need for close follow up. Patient and/or guardian understands verbal and written discharge instructions. Patient and/or guardian comfortable with  plan and disposition.  Patient and/or guardian has a clear mental status at this time, good insight into illness (after discussion and teaching) and has clear judgment to make decisions regarding their care  Documentation was completed with the aid of voice recognition software. Transcription may contain typographical errors. Final Clinical Impressions(s) / UC Diagnoses   Final diagnoses:  Abnormal vaginal bleeding  Lesion of perianal area     Discharge Instructions      Abnormal uterine bleeding is any bleeding from the uterus that is different from your normal menstrual cycle. This may include bleeding between periods, very heavy periods, periods that last longer than usual, or bleeding after menopause. There are many possible causes, including hormone changes, stress, certain medications, infections, fibroids, or other medical conditions. It's important to track your bleeding and report any changes to your healthcare provider. Depending on the cause, treatment may include medication, lifestyle changes, or further testing. If you experience very heavy bleeding, dizziness, or severe pain, seek medical attention right away.     ED Prescriptions   None    PDMP not reviewed this encounter.   Iola Lukes, [place] 11/22/23 [phone]

## 2023-11-22 NOTE — ED Triage Notes (Signed)
 Pt reports her cycle starting late and lasting 4 days. Reports dark spotting and bright red blood. Pt states she has lower abd cramping.

## 2023-11-23 ENCOUNTER — Encounter (HOSPITAL_BASED_OUTPATIENT_CLINIC_OR_DEPARTMENT_OTHER): Payer: Self-pay

## 2023-11-23 ENCOUNTER — Emergency Department (HOSPITAL_BASED_OUTPATIENT_CLINIC_OR_DEPARTMENT_OTHER)
Admission: EM | Admit: 2023-11-23 | Discharge: 2023-11-23 | Disposition: A | Discharge status: Home / Self Care | Attending: Emergency Medicine | Admitting: Emergency Medicine

## 2023-11-23 DIAGNOSIS — Z03823 Encounter for observation for suspected inserted (injected) foreign body ruled out: Secondary | ICD-10-CM | POA: Insufficient documentation

## 2023-11-23 DIAGNOSIS — T192XXA Foreign body in vulva and vagina, initial encounter: Secondary | ICD-10-CM

## 2023-11-23 DIAGNOSIS — N939 Abnormal uterine and vaginal bleeding, unspecified: Secondary | ICD-10-CM | POA: Diagnosis present

## 2023-11-23 NOTE — Discharge Instructions (Signed)
 Please return for vaginal discharge fever.  Please follow-up with your family doctor or GYN in the office.

## 2023-11-23 NOTE — ED Provider Notes (Signed)
 [place] [facility] AT [facility] Provider Note   CSN: [id number] Arrival date & time: 11/23/23  8469     History  Chief Complaint  Patient presents with   Foreign Body in Vagina    Lorraine Chambers is a 38 y.o. female.  38 yo F with a chief complaints of concern for a retained foreign body in the vagina.  Patient think she put a tampon in into 8 PM last night and then does not remember ever taking it out and when she got up this morning was unable to find the tampon.  She denies any discharge.  Has had some vaginal bleeding.   Foreign Body in Vagina       Home Medications Prior to Admission medications   Medication Sig Start Date End Date Taking? Authorizing Provider  acetaminophen  (TYLENOL ) 500 MG tablet Take 1,000 mg by mouth as needed (pain).    [provider]  albuterol  (VENTOLIN  HFA) 108 (90 Base) MCG/ACT inhaler Inhale 1-2 puffs into the lungs every 4 (four) hours as needed. 01/10/22     albuterol  (VENTOLIN  HFA) 108 (90 Base) MCG/ACT inhaler Inhale 1-2 puffs into the lungs every 4 (four) hours as needed. 07/29/23     dicyclomine  (BENTYL ) 20 MG tablet Take 1 tablet (20 mg total) by mouth 2 (two) times daily. 06/27/20   Khatri, Hina, PA-C  doxycycline  (VIBRAMYCIN ) 100 MG capsule Take 1 capsule (100 mg total) by mouth 2 (two) times daily. 10/09/23   Reddick, Johnathan B, NP  etonogestrel (NEXPLANON) 68 MG IMPL implant 1 each by Subdermal route once.    [provider]  fluconazole  (DIFLUCAN ) 150 MG tablet Take 1 tablet (150 mg total) by mouth every 3 (three) days. 05/04/23   Adolph Hoop, PA-C  ipratropium-albuterol  (DUONEB) 0.5-2.5 (3) MG/3ML SOLN Take 3 mLs by nebulization every 6 (six) hours as needed. 11/28/20   Erlinda Haws, NP  metFORMIN  (GLUCOPHAGE -XR) 500 MG 24 hr tablet Take 1 tablet (500 mg total) by mouth daily with supper. 07/03/23     metFORMIN  (GLUCOPHAGE -XR) 500 MG 24 hr tablet Take 1 tablet (500 mg total) by mouth  daily with supper. 07/29/23     Tirzepatide  (MOUNJARO  [place]) Inject into the skin.    [provider]  tirzepatide  (ZEPBOUND ) 10 MG/0.5ML Pen Inject 10 mg into the skin once a week. 11/14/23     tirzepatide  (ZEPBOUND ) 2.5 MG/0.5ML Pen Inject 2.5 mg into the skin once a week. 05/06/23     tirzepatide  (ZEPBOUND ) 5 MG/0.5ML Pen Inject 5 mg into the skin once a week. 07/03/23     tirzepatide  (ZEPBOUND ) 7.5 MG/0.5ML Pen Inject 7.5 mg into the skin once a week. 08/14/23     promethazine  (PHENERGAN ) 25 MG tablet Take 1 tablet (25 mg total) by mouth every 6 (six) hours as needed for nausea or vomiting. Patient not taking: Reported on 05/17/2019 01/02/19 12/13/19  Carleton Cheek, PA-C      Allergies    Bee venom    Review of Systems   Review of Systems  Physical Exam Updated Vital Signs BP 134/83 (BP Location: Right Arm)   Pulse 68   Temp 98.2 F (36.8 C)   Resp 18   Ht 5' (1.524 m)   Wt 92.5 kg   LMP 10/26/2023 (Exact Date)   SpO2 100%   BMI 39.84 kg/m  Physical Exam Vitals and nursing note reviewed.  Constitutional:      General: She is not in acute distress.  Appearance: She is well-developed. She is not diaphoretic.  HENT:     Head: Normocephalic and atraumatic.  Eyes:     Pupils: Pupils are equal, round, and reactive to light.  Cardiovascular:     Rate and Rhythm: Normal rate and regular rhythm.     Heart sounds: No murmur heard.    No friction rub. No gallop.  Pulmonary:     Effort: Pulmonary effort is normal.     Breath sounds: No wheezing or rales.  Abdominal:     General: There is no distension.     Palpations: Abdomen is soft.     Tenderness: There is no abdominal tenderness.  Genitourinary:    Comments: Trace blood from the cervical os.  Some blood in the vault.  No obvious foreign body. Musculoskeletal:        General: No tenderness.     Cervical back: Normal range of motion and neck supple.  Skin:    General: Skin is warm and dry.  Neurological:     Mental  Status: She is alert and oriented to person, place, and time.  Psychiatric:        Behavior: Behavior normal.     ED Results / Procedures / Treatments   Labs (all labs ordered are listed, but only abnormal results are displayed) Labs Reviewed - No data to display  EKG None  Radiology No results found.  Procedures Procedures    Medications Ordered in ED Medications - No data to display  ED Course/ Medical Decision Making/ A&P                                 Medical Decision Making  38 yo F with a chief complaint of concern for retained tampon.  No obvious tampon found on exam.  No discharge.  Will have the patient follow-up with her family doctor or GYN in the office.  7:52 AM:  I have discussed the diagnosis/risks/treatment options with the patient.  Evaluation and diagnostic testing in the emergency department does not suggest an emergent condition requiring admission or immediate intervention beyond what has been performed at this time.  They will follow up with PCP, GYN. We also discussed returning to the ED immediately if new or worsening sx occur. We discussed the sx which are most concerning (e.g., sudden worsening pain, fever, inability to tolerate by mouth, discharge) that necessitate immediate return. Medications administered to the patient during their visit and any new prescriptions provided to the patient are listed below.  Medications given during this visit Medications - No data to display   The patient appears reasonably screen and/or stabilized for discharge and I doubt any other medical condition or other [facility] requiring further screening, evaluation, or treatment in the ED at this time prior to discharge.          Final Clinical Impression(s) / ED Diagnoses Final diagnoses:  Retained tampon not found on examination    Rx / DC Orders ED Discharge Orders     None         Albertus Hughs, DO 11/23/23 [id number]

## 2023-11-23 NOTE — ED Triage Notes (Signed)
 Pt states that she thinks that she has a tampon stuck in her vagina. States that she fell asleep with it in last night and this morning she couldn't find the string. Reports heavy bleeding since 3/27

## 2023-11-25 LAB — CERVICOVAGINAL ANCILLARY ONLY
Bacterial Vaginitis (gardnerella): NEGATIVE
Candida Glabrata: NEGATIVE
Candida Vaginitis: NEGATIVE
Chlamydia: NEGATIVE
Comment: NEGATIVE
Comment: NEGATIVE
Comment: NEGATIVE
Comment: NEGATIVE
Comment: NEGATIVE
Comment: NORMAL
Neisseria Gonorrhea: NEGATIVE
Trichomonas: NEGATIVE

## 2023-11-26 ENCOUNTER — Other Ambulatory Visit (HOSPITAL_COMMUNITY): Payer: Self-pay

## 2023-11-27 ENCOUNTER — Other Ambulatory Visit (HOSPITAL_COMMUNITY): Payer: Self-pay

## 2023-11-27 MED ORDER — ALBUTEROL SULFATE HFA 108 (90 BASE) MCG/ACT IN AERS
1.0000 | INHALATION_SPRAY | RESPIRATORY_TRACT | 1 refills | Status: AC | PRN
  Filled 2023-11-27: qty 6.7, 25d supply, fill #0

## 2023-12-26 ENCOUNTER — Other Ambulatory Visit (HOSPITAL_COMMUNITY): Payer: Self-pay

## 2023-12-26 MED ORDER — ZEPBOUND 12.5 MG/0.5ML ~~LOC~~ SOAJ
12.5000 mg | SUBCUTANEOUS | 0 refills | Status: DC
Start: 2023-12-26 — End: 2024-02-06
  Filled 2023-12-26 – 2024-01-07 (×3): qty 2, 28d supply, fill #0

## 2024-01-03 ENCOUNTER — Other Ambulatory Visit (HOSPITAL_COMMUNITY): Payer: Self-pay

## 2024-01-03 MED ORDER — BUSPIRONE HCL 5 MG PO TABS
5.0000 mg | ORAL_TABLET | Freq: Two times a day (BID) | ORAL | 0 refills | Status: AC
  Filled 2024-01-03 – 2024-06-19 (×2): qty 60, 30d supply, fill #0

## 2024-01-07 ENCOUNTER — Other Ambulatory Visit (HOSPITAL_COMMUNITY): Payer: Self-pay

## 2024-01-07 MED ORDER — HYDROXYZINE PAMOATE 25 MG PO CAPS
25.0000 mg | ORAL_CAPSULE | Freq: Every day | ORAL | 0 refills | Status: AC | PRN
  Filled 2024-01-07: qty 30, 30d supply, fill #0

## 2024-01-07 MED ORDER — ESCITALOPRAM OXALATE 10 MG PO TABS
10.0000 mg | ORAL_TABLET | Freq: Every day | ORAL | 0 refills | Status: AC
  Filled 2024-01-07: qty 30, 30d supply, fill #0

## 2024-01-15 ENCOUNTER — Other Ambulatory Visit (HOSPITAL_COMMUNITY): Payer: Self-pay

## 2024-02-03 ENCOUNTER — Other Ambulatory Visit (HOSPITAL_COMMUNITY): Payer: Self-pay

## 2024-02-04 ENCOUNTER — Other Ambulatory Visit (HOSPITAL_COMMUNITY): Payer: Self-pay

## 2024-02-04 MED ORDER — HYDROXYZINE PAMOATE 25 MG PO CAPS
25.0000 mg | ORAL_CAPSULE | Freq: Every day | ORAL | 0 refills | Status: AC | PRN
  Filled 2024-02-04 – 2024-02-17 (×2): qty 60, 30d supply, fill #0

## 2024-02-04 MED ORDER — TRAZODONE HCL 50 MG PO TABS
25.0000 mg | ORAL_TABLET | Freq: Every evening | ORAL | 0 refills | Status: DC | PRN
  Filled 2024-02-04 – 2024-02-17 (×2): qty 30, 30d supply, fill #0

## 2024-02-04 MED ORDER — ESCITALOPRAM OXALATE 20 MG PO TABS
20.0000 mg | ORAL_TABLET | Freq: Every day | ORAL | 0 refills | Status: DC
  Filled 2024-02-04 – 2024-02-17 (×2): qty 30, 30d supply, fill #0

## 2024-02-06 ENCOUNTER — Encounter (HOSPITAL_COMMUNITY): Payer: Self-pay

## 2024-02-06 ENCOUNTER — Other Ambulatory Visit (HOSPITAL_COMMUNITY): Payer: Self-pay

## 2024-02-06 MED ORDER — ZEPBOUND 12.5 MG/0.5ML ~~LOC~~ SOAJ
12.5000 mg | SUBCUTANEOUS | 3 refills | Status: AC
  Filled 2024-02-06 – 2024-02-18 (×7): qty 2, 28d supply, fill #0

## 2024-02-10 ENCOUNTER — Other Ambulatory Visit (HOSPITAL_COMMUNITY): Payer: Self-pay

## 2024-02-14 ENCOUNTER — Other Ambulatory Visit (HOSPITAL_COMMUNITY): Payer: Self-pay

## 2024-02-17 ENCOUNTER — Other Ambulatory Visit (HOSPITAL_COMMUNITY): Payer: Self-pay

## 2024-02-18 ENCOUNTER — Other Ambulatory Visit (HOSPITAL_COMMUNITY): Payer: Self-pay

## 2024-02-18 MED ORDER — MOUNJARO 12.5 MG/0.5ML ~~LOC~~ SOAJ
12.5000 mg | SUBCUTANEOUS | 3 refills | Status: AC
  Filled 2024-02-18: qty 2, 28d supply, fill #0
  Filled 2024-03-23: qty 2, 28d supply, fill #1
  Filled 2024-04-23 – 2024-05-05 (×2): qty 2, 28d supply, fill #2
  Filled 2024-06-19: qty 2, 28d supply, fill #3
  Filled 2024-07-13: qty 2, 28d supply, fill #4

## 2024-03-03 ENCOUNTER — Other Ambulatory Visit (HOSPITAL_COMMUNITY): Payer: Self-pay

## 2024-03-03 MED ORDER — ESCITALOPRAM OXALATE 20 MG PO TABS
20.0000 mg | ORAL_TABLET | Freq: Every day | ORAL | 0 refills | Status: DC
  Filled 2024-03-03 – 2024-03-23 (×2): qty 30, 30d supply, fill #0

## 2024-03-03 MED ORDER — TRAZODONE HCL 50 MG PO TABS
50.0000 mg | ORAL_TABLET | Freq: Every evening | ORAL | 0 refills | Status: DC | PRN
  Filled 2024-03-03 – 2024-03-23 (×2): qty 30, 30d supply, fill #0

## 2024-03-03 MED ORDER — HYDROXYZINE PAMOATE 25 MG PO CAPS
25.0000 mg | ORAL_CAPSULE | Freq: Every day | ORAL | 0 refills | Status: DC
  Filled 2024-03-03 – 2024-03-23 (×2): qty 60, 30d supply, fill #0

## 2024-03-09 ENCOUNTER — Other Ambulatory Visit (HOSPITAL_COMMUNITY): Payer: Self-pay

## 2024-03-23 ENCOUNTER — Other Ambulatory Visit (HOSPITAL_COMMUNITY): Payer: Self-pay

## 2024-03-23 ENCOUNTER — Other Ambulatory Visit: Payer: Self-pay

## 2024-03-25 ENCOUNTER — Other Ambulatory Visit (HOSPITAL_COMMUNITY): Payer: Self-pay

## 2024-03-31 ENCOUNTER — Other Ambulatory Visit (HOSPITAL_COMMUNITY): Payer: Self-pay

## 2024-03-31 MED ORDER — HYDROXYZINE PAMOATE 25 MG PO CAPS
25.0000 mg | ORAL_CAPSULE | Freq: Every day | ORAL | 0 refills | Status: AC | PRN
  Filled 2024-04-17 – 2024-06-19 (×2): qty 60, 30d supply, fill #0

## 2024-03-31 MED ORDER — TRAZODONE HCL 50 MG PO TABS
50.0000 mg | ORAL_TABLET | Freq: Every evening | ORAL | 0 refills | Status: AC | PRN
  Filled 2024-03-31 – 2024-06-19 (×3): qty 30, 30d supply, fill #0

## 2024-03-31 MED ORDER — ESCITALOPRAM OXALATE 20 MG PO TABS
20.0000 mg | ORAL_TABLET | Freq: Every day | ORAL | 0 refills | Status: DC
  Filled 2024-03-31 – 2024-06-19 (×3): qty 30, 30d supply, fill #0

## 2024-04-17 ENCOUNTER — Other Ambulatory Visit (HOSPITAL_COMMUNITY): Payer: Self-pay

## 2024-04-23 ENCOUNTER — Other Ambulatory Visit (HOSPITAL_COMMUNITY): Payer: Self-pay

## 2024-04-28 ENCOUNTER — Other Ambulatory Visit: Payer: Self-pay

## 2024-04-28 ENCOUNTER — Other Ambulatory Visit (HOSPITAL_COMMUNITY): Payer: Self-pay

## 2024-04-28 MED ORDER — HYDROXYZINE PAMOATE 25 MG PO CAPS
25.0000 mg | ORAL_CAPSULE | Freq: Every day | ORAL | 0 refills | Status: AC | PRN
  Filled 2024-04-28: qty 90, 30d supply, fill #0

## 2024-04-28 MED ORDER — ESCITALOPRAM OXALATE 20 MG PO TABS
20.0000 mg | ORAL_TABLET | Freq: Every day | ORAL | 0 refills | Status: AC
  Filled 2024-04-28: qty 30, 30d supply, fill #0

## 2024-04-28 MED ORDER — TRAZODONE HCL 50 MG PO TABS
50.0000 mg | ORAL_TABLET | Freq: Every evening | ORAL | 0 refills | Status: AC | PRN
  Filled 2024-04-28: qty 30, 30d supply, fill #0

## 2024-05-04 ENCOUNTER — Other Ambulatory Visit (HOSPITAL_COMMUNITY): Payer: Self-pay

## 2024-05-05 ENCOUNTER — Other Ambulatory Visit (HOSPITAL_COMMUNITY): Payer: Self-pay

## 2024-05-22 ENCOUNTER — Other Ambulatory Visit (HOSPITAL_COMMUNITY): Payer: Self-pay

## 2024-06-19 ENCOUNTER — Other Ambulatory Visit (HOSPITAL_COMMUNITY): Payer: Self-pay

## 2024-07-13 ENCOUNTER — Other Ambulatory Visit (HOSPITAL_COMMUNITY): Payer: Self-pay

## 2024-07-23 ENCOUNTER — Other Ambulatory Visit (HOSPITAL_COMMUNITY): Payer: Self-pay

## 2024-07-23 MED ORDER — HYDROXYZINE PAMOATE 25 MG PO CAPS
25.0000 mg | ORAL_CAPSULE | Freq: Every day | ORAL | 0 refills | Status: AC | PRN
  Filled 2024-07-23: qty 90, 30d supply, fill #0

## 2024-07-23 MED ORDER — TRAZODONE HCL 100 MG PO TABS
100.0000 mg | ORAL_TABLET | Freq: Every evening | ORAL | 0 refills | Status: AC | PRN
  Filled 2024-07-23: qty 30, 30d supply, fill #0

## 2024-07-23 MED ORDER — ESCITALOPRAM OXALATE 20 MG PO TABS
20.0000 mg | ORAL_TABLET | Freq: Every day | ORAL | 0 refills | Status: AC
  Filled 2024-07-23: qty 30, 30d supply, fill #0

## 2024-08-03 ENCOUNTER — Other Ambulatory Visit (HOSPITAL_COMMUNITY): Payer: Self-pay

## 2024-08-03 MED ORDER — IBUPROFEN 800 MG PO TABS
800.0000 mg | ORAL_TABLET | Freq: Three times a day (TID) | ORAL | 0 refills | Status: AC
  Filled 2024-08-03 – 2024-08-14 (×2): qty 30, 10d supply, fill #0

## 2024-08-03 MED ORDER — ALBUTEROL SULFATE HFA 108 (90 BASE) MCG/ACT IN AERS
1.0000 | INHALATION_SPRAY | RESPIRATORY_TRACT | 1 refills | Status: AC
  Filled 2024-08-03: qty 6.7, 30d supply, fill #0
  Filled 2024-08-14: qty 6.7, 25d supply, fill #0

## 2024-08-07 ENCOUNTER — Other Ambulatory Visit (HOSPITAL_COMMUNITY): Payer: Self-pay

## 2024-08-07 MED ORDER — MOUNJARO 12.5 MG/0.5ML ~~LOC~~ SOAJ
12.5000 mg | SUBCUTANEOUS | 1 refills | Status: AC
  Filled 2024-08-07: qty 6, 84d supply, fill #0
  Filled 2024-08-11: qty 2, 28d supply, fill #0

## 2024-08-11 ENCOUNTER — Other Ambulatory Visit (HOSPITAL_COMMUNITY): Payer: Self-pay

## 2024-08-13 ENCOUNTER — Other Ambulatory Visit (HOSPITAL_COMMUNITY): Payer: Self-pay

## 2024-08-14 ENCOUNTER — Other Ambulatory Visit (HOSPITAL_COMMUNITY): Payer: Self-pay
